# Patient Record
Sex: Male | Born: 1950 | Race: White | Hispanic: No | Marital: Married | Smoking: Former smoker
Health system: Southern US, Community
[De-identification: ages and names within clinical notes are randomized; demographics above are authoritative.]

## PROBLEM LIST (undated history)

## (undated) DIAGNOSIS — Z951 Presence of aortocoronary bypass graft: Secondary | ICD-10-CM

## (undated) DIAGNOSIS — N1831 Chronic kidney disease, stage 3a: Secondary | ICD-10-CM

## (undated) DIAGNOSIS — I2510 Atherosclerotic heart disease of native coronary artery without angina pectoris: Secondary | ICD-10-CM

## (undated) DIAGNOSIS — I5022 Chronic systolic (congestive) heart failure: Secondary | ICD-10-CM

## (undated) DIAGNOSIS — E039 Hypothyroidism, unspecified: Secondary | ICD-10-CM

---

## 1994-12-27 HISTORY — PX: CHOLECYSTECTOMY: SHX55

## 1999-12-28 HISTORY — PX: OTHER SURGICAL HISTORY: SHX169

## 2010-05-05 DIAGNOSIS — R7303 Prediabetes: Secondary | ICD-10-CM | POA: Insufficient documentation

## 2010-05-05 HISTORY — DX: Prediabetes: R73.03

## 2011-06-01 DIAGNOSIS — I25119 Atherosclerotic heart disease of native coronary artery with unspecified angina pectoris: Secondary | ICD-10-CM | POA: Insufficient documentation

## 2011-06-01 DIAGNOSIS — I2510 Atherosclerotic heart disease of native coronary artery without angina pectoris: Secondary | ICD-10-CM | POA: Insufficient documentation

## 2011-06-01 HISTORY — DX: Atherosclerotic heart disease of native coronary artery without angina pectoris: I25.10

## 2011-06-26 DIAGNOSIS — I2510 Atherosclerotic heart disease of native coronary artery without angina pectoris: Secondary | ICD-10-CM | POA: Insufficient documentation

## 2011-06-26 DIAGNOSIS — I252 Old myocardial infarction: Secondary | ICD-10-CM

## 2011-06-26 DIAGNOSIS — Z8719 Personal history of other diseases of the digestive system: Secondary | ICD-10-CM | POA: Insufficient documentation

## 2011-06-26 HISTORY — DX: Old myocardial infarction: I25.2

## 2017-10-17 HISTORY — PX: OTHER SURGICAL HISTORY: SHX169

## 2017-10-19 DIAGNOSIS — Z95810 Presence of automatic (implantable) cardiac defibrillator: Secondary | ICD-10-CM

## 2017-10-19 DIAGNOSIS — Z789 Other specified health status: Secondary | ICD-10-CM | POA: Insufficient documentation

## 2017-10-19 HISTORY — DX: Presence of automatic (implantable) cardiac defibrillator: Z95.810

## 2018-12-05 DIAGNOSIS — I77810 Thoracic aortic ectasia: Secondary | ICD-10-CM | POA: Insufficient documentation

## 2018-12-05 HISTORY — DX: Thoracic aortic ectasia: I77.810

## 2018-12-27 HISTORY — PX: ABDOMINAL HERNIA REPAIR: SHX539

## 2019-04-24 DIAGNOSIS — E039 Hypothyroidism, unspecified: Secondary | ICD-10-CM | POA: Insufficient documentation

## 2019-04-24 DIAGNOSIS — E038 Other specified hypothyroidism: Secondary | ICD-10-CM | POA: Insufficient documentation

## 2019-04-24 HISTORY — DX: Other specified hypothyroidism: E03.8

## 2019-09-14 DIAGNOSIS — I4729 Other ventricular tachycardia: Secondary | ICD-10-CM

## 2019-09-14 DIAGNOSIS — I472 Ventricular tachycardia, unspecified: Secondary | ICD-10-CM | POA: Diagnosis present

## 2019-09-14 DIAGNOSIS — I447 Left bundle-branch block, unspecified: Secondary | ICD-10-CM | POA: Insufficient documentation

## 2019-09-14 HISTORY — DX: Ventricular tachycardia: I47.2

## 2019-09-14 HISTORY — DX: Left bundle-branch block, unspecified: I44.7

## 2019-09-14 HISTORY — DX: Other ventricular tachycardia: I47.29

## 2020-08-15 DIAGNOSIS — R0602 Shortness of breath: Secondary | ICD-10-CM | POA: Diagnosis not present

## 2020-08-15 DIAGNOSIS — E785 Hyperlipidemia, unspecified: Secondary | ICD-10-CM | POA: Insufficient documentation

## 2020-08-15 DIAGNOSIS — Z950 Presence of cardiac pacemaker: Secondary | ICD-10-CM | POA: Diagnosis not present

## 2020-08-15 DIAGNOSIS — I429 Cardiomyopathy, unspecified: Secondary | ICD-10-CM | POA: Diagnosis not present

## 2020-08-15 DIAGNOSIS — I2510 Atherosclerotic heart disease of native coronary artery without angina pectoris: Secondary | ICD-10-CM | POA: Diagnosis not present

## 2020-08-15 DIAGNOSIS — R42 Dizziness and giddiness: Secondary | ICD-10-CM | POA: Diagnosis not present

## 2020-08-15 DIAGNOSIS — I472 Ventricular tachycardia: Secondary | ICD-10-CM | POA: Diagnosis not present

## 2020-08-15 HISTORY — DX: Hyperlipidemia, unspecified: E78.5

## 2020-08-27 DIAGNOSIS — M5136 Other intervertebral disc degeneration, lumbar region: Secondary | ICD-10-CM | POA: Diagnosis not present

## 2020-08-27 DIAGNOSIS — M9903 Segmental and somatic dysfunction of lumbar region: Secondary | ICD-10-CM | POA: Diagnosis not present

## 2020-08-27 DIAGNOSIS — M9904 Segmental and somatic dysfunction of sacral region: Secondary | ICD-10-CM | POA: Diagnosis not present

## 2020-08-27 DIAGNOSIS — M5441 Lumbago with sciatica, right side: Secondary | ICD-10-CM | POA: Diagnosis not present

## 2020-08-27 DIAGNOSIS — M5137 Other intervertebral disc degeneration, lumbosacral region: Secondary | ICD-10-CM | POA: Diagnosis not present

## 2020-08-29 DIAGNOSIS — M5137 Other intervertebral disc degeneration, lumbosacral region: Secondary | ICD-10-CM | POA: Diagnosis not present

## 2020-08-29 DIAGNOSIS — M5441 Lumbago with sciatica, right side: Secondary | ICD-10-CM | POA: Diagnosis not present

## 2020-08-29 DIAGNOSIS — M5136 Other intervertebral disc degeneration, lumbar region: Secondary | ICD-10-CM | POA: Diagnosis not present

## 2020-08-29 DIAGNOSIS — M9903 Segmental and somatic dysfunction of lumbar region: Secondary | ICD-10-CM | POA: Diagnosis not present

## 2020-08-29 DIAGNOSIS — M9904 Segmental and somatic dysfunction of sacral region: Secondary | ICD-10-CM | POA: Diagnosis not present

## 2020-09-05 DIAGNOSIS — M5137 Other intervertebral disc degeneration, lumbosacral region: Secondary | ICD-10-CM | POA: Diagnosis not present

## 2020-09-05 DIAGNOSIS — M5441 Lumbago with sciatica, right side: Secondary | ICD-10-CM | POA: Diagnosis not present

## 2020-09-05 DIAGNOSIS — M9904 Segmental and somatic dysfunction of sacral region: Secondary | ICD-10-CM | POA: Diagnosis not present

## 2020-09-05 DIAGNOSIS — M9903 Segmental and somatic dysfunction of lumbar region: Secondary | ICD-10-CM | POA: Diagnosis not present

## 2020-09-05 DIAGNOSIS — M5136 Other intervertebral disc degeneration, lumbar region: Secondary | ICD-10-CM | POA: Diagnosis not present

## 2020-09-10 DIAGNOSIS — I429 Cardiomyopathy, unspecified: Secondary | ICD-10-CM | POA: Diagnosis not present

## 2020-09-10 DIAGNOSIS — R0602 Shortness of breath: Secondary | ICD-10-CM | POA: Diagnosis not present

## 2020-09-10 DIAGNOSIS — I2510 Atherosclerotic heart disease of native coronary artery without angina pectoris: Secondary | ICD-10-CM | POA: Diagnosis not present

## 2020-09-10 DIAGNOSIS — I6523 Occlusion and stenosis of bilateral carotid arteries: Secondary | ICD-10-CM | POA: Diagnosis not present

## 2020-09-10 DIAGNOSIS — Z95810 Presence of automatic (implantable) cardiac defibrillator: Secondary | ICD-10-CM | POA: Diagnosis not present

## 2020-09-10 DIAGNOSIS — I255 Ischemic cardiomyopathy: Secondary | ICD-10-CM | POA: Diagnosis not present

## 2020-09-10 DIAGNOSIS — I081 Rheumatic disorders of both mitral and tricuspid valves: Secondary | ICD-10-CM | POA: Diagnosis not present

## 2020-09-10 DIAGNOSIS — I472 Ventricular tachycardia: Secondary | ICD-10-CM | POA: Diagnosis not present

## 2020-09-17 DIAGNOSIS — M9904 Segmental and somatic dysfunction of sacral region: Secondary | ICD-10-CM | POA: Diagnosis not present

## 2020-09-17 DIAGNOSIS — M9903 Segmental and somatic dysfunction of lumbar region: Secondary | ICD-10-CM | POA: Diagnosis not present

## 2020-09-17 DIAGNOSIS — M5136 Other intervertebral disc degeneration, lumbar region: Secondary | ICD-10-CM | POA: Diagnosis not present

## 2020-09-17 DIAGNOSIS — M5441 Lumbago with sciatica, right side: Secondary | ICD-10-CM | POA: Diagnosis not present

## 2020-09-17 DIAGNOSIS — M5137 Other intervertebral disc degeneration, lumbosacral region: Secondary | ICD-10-CM | POA: Diagnosis not present

## 2020-09-24 DIAGNOSIS — M5137 Other intervertebral disc degeneration, lumbosacral region: Secondary | ICD-10-CM | POA: Diagnosis not present

## 2020-09-24 DIAGNOSIS — M9903 Segmental and somatic dysfunction of lumbar region: Secondary | ICD-10-CM | POA: Diagnosis not present

## 2020-09-24 DIAGNOSIS — M5441 Lumbago with sciatica, right side: Secondary | ICD-10-CM | POA: Diagnosis not present

## 2020-09-24 DIAGNOSIS — M9904 Segmental and somatic dysfunction of sacral region: Secondary | ICD-10-CM | POA: Diagnosis not present

## 2020-09-24 DIAGNOSIS — M5136 Other intervertebral disc degeneration, lumbar region: Secondary | ICD-10-CM | POA: Diagnosis not present

## 2020-10-01 DIAGNOSIS — I2510 Atherosclerotic heart disease of native coronary artery without angina pectoris: Secondary | ICD-10-CM | POA: Diagnosis not present

## 2020-10-01 DIAGNOSIS — M5441 Lumbago with sciatica, right side: Secondary | ICD-10-CM | POA: Diagnosis not present

## 2020-10-01 DIAGNOSIS — M9903 Segmental and somatic dysfunction of lumbar region: Secondary | ICD-10-CM | POA: Diagnosis not present

## 2020-10-01 DIAGNOSIS — M5136 Other intervertebral disc degeneration, lumbar region: Secondary | ICD-10-CM | POA: Diagnosis not present

## 2020-10-01 DIAGNOSIS — E782 Mixed hyperlipidemia: Secondary | ICD-10-CM | POA: Diagnosis not present

## 2020-10-01 DIAGNOSIS — Z95810 Presence of automatic (implantable) cardiac defibrillator: Secondary | ICD-10-CM | POA: Diagnosis not present

## 2020-10-01 DIAGNOSIS — M5137 Other intervertebral disc degeneration, lumbosacral region: Secondary | ICD-10-CM | POA: Diagnosis not present

## 2020-10-01 DIAGNOSIS — I255 Ischemic cardiomyopathy: Secondary | ICD-10-CM | POA: Diagnosis not present

## 2020-10-01 DIAGNOSIS — M9904 Segmental and somatic dysfunction of sacral region: Secondary | ICD-10-CM | POA: Diagnosis not present

## 2020-10-01 DIAGNOSIS — I4891 Unspecified atrial fibrillation: Secondary | ICD-10-CM | POA: Diagnosis not present

## 2020-10-01 DIAGNOSIS — Z111 Encounter for screening for respiratory tuberculosis: Secondary | ICD-10-CM | POA: Diagnosis not present

## 2020-10-01 DIAGNOSIS — Z021 Encounter for pre-employment examination: Secondary | ICD-10-CM | POA: Diagnosis not present

## 2020-10-06 DIAGNOSIS — E039 Hypothyroidism, unspecified: Secondary | ICD-10-CM | POA: Diagnosis not present

## 2020-10-06 DIAGNOSIS — Z79899 Other long term (current) drug therapy: Secondary | ICD-10-CM | POA: Diagnosis not present

## 2020-10-06 DIAGNOSIS — I429 Cardiomyopathy, unspecified: Secondary | ICD-10-CM | POA: Diagnosis not present

## 2020-10-08 DIAGNOSIS — M5136 Other intervertebral disc degeneration, lumbar region: Secondary | ICD-10-CM | POA: Diagnosis not present

## 2020-10-08 DIAGNOSIS — M5137 Other intervertebral disc degeneration, lumbosacral region: Secondary | ICD-10-CM | POA: Diagnosis not present

## 2020-10-08 DIAGNOSIS — M5441 Lumbago with sciatica, right side: Secondary | ICD-10-CM | POA: Diagnosis not present

## 2020-10-08 DIAGNOSIS — M9904 Segmental and somatic dysfunction of sacral region: Secondary | ICD-10-CM | POA: Diagnosis not present

## 2020-10-08 DIAGNOSIS — M9903 Segmental and somatic dysfunction of lumbar region: Secondary | ICD-10-CM | POA: Diagnosis not present

## 2020-10-15 DIAGNOSIS — M5137 Other intervertebral disc degeneration, lumbosacral region: Secondary | ICD-10-CM | POA: Diagnosis not present

## 2020-10-15 DIAGNOSIS — M9903 Segmental and somatic dysfunction of lumbar region: Secondary | ICD-10-CM | POA: Diagnosis not present

## 2020-10-15 DIAGNOSIS — M5441 Lumbago with sciatica, right side: Secondary | ICD-10-CM | POA: Diagnosis not present

## 2020-10-15 DIAGNOSIS — M5136 Other intervertebral disc degeneration, lumbar region: Secondary | ICD-10-CM | POA: Diagnosis not present

## 2020-10-15 DIAGNOSIS — M9904 Segmental and somatic dysfunction of sacral region: Secondary | ICD-10-CM | POA: Diagnosis not present

## 2020-10-22 DIAGNOSIS — M9903 Segmental and somatic dysfunction of lumbar region: Secondary | ICD-10-CM | POA: Diagnosis not present

## 2020-10-22 DIAGNOSIS — M5136 Other intervertebral disc degeneration, lumbar region: Secondary | ICD-10-CM | POA: Diagnosis not present

## 2020-10-22 DIAGNOSIS — M5137 Other intervertebral disc degeneration, lumbosacral region: Secondary | ICD-10-CM | POA: Diagnosis not present

## 2020-10-22 DIAGNOSIS — M9904 Segmental and somatic dysfunction of sacral region: Secondary | ICD-10-CM | POA: Diagnosis not present

## 2020-10-22 DIAGNOSIS — M5441 Lumbago with sciatica, right side: Secondary | ICD-10-CM | POA: Diagnosis not present

## 2020-10-29 DIAGNOSIS — M5137 Other intervertebral disc degeneration, lumbosacral region: Secondary | ICD-10-CM | POA: Diagnosis not present

## 2020-10-29 DIAGNOSIS — M9903 Segmental and somatic dysfunction of lumbar region: Secondary | ICD-10-CM | POA: Diagnosis not present

## 2020-10-29 DIAGNOSIS — M9904 Segmental and somatic dysfunction of sacral region: Secondary | ICD-10-CM | POA: Diagnosis not present

## 2020-10-29 DIAGNOSIS — M5441 Lumbago with sciatica, right side: Secondary | ICD-10-CM | POA: Diagnosis not present

## 2020-10-29 DIAGNOSIS — M5136 Other intervertebral disc degeneration, lumbar region: Secondary | ICD-10-CM | POA: Diagnosis not present

## 2020-11-05 DIAGNOSIS — M5136 Other intervertebral disc degeneration, lumbar region: Secondary | ICD-10-CM | POA: Diagnosis not present

## 2020-11-05 DIAGNOSIS — M5441 Lumbago with sciatica, right side: Secondary | ICD-10-CM | POA: Diagnosis not present

## 2020-11-05 DIAGNOSIS — M9904 Segmental and somatic dysfunction of sacral region: Secondary | ICD-10-CM | POA: Diagnosis not present

## 2020-11-05 DIAGNOSIS — M9903 Segmental and somatic dysfunction of lumbar region: Secondary | ICD-10-CM | POA: Diagnosis not present

## 2020-11-05 DIAGNOSIS — M5137 Other intervertebral disc degeneration, lumbosacral region: Secondary | ICD-10-CM | POA: Diagnosis not present

## 2021-01-01 DIAGNOSIS — Z95810 Presence of automatic (implantable) cardiac defibrillator: Secondary | ICD-10-CM | POA: Diagnosis not present

## 2021-01-25 DIAGNOSIS — R509 Fever, unspecified: Secondary | ICD-10-CM | POA: Diagnosis not present

## 2021-01-25 DIAGNOSIS — Z20828 Contact with and (suspected) exposure to other viral communicable diseases: Secondary | ICD-10-CM | POA: Diagnosis not present

## 2021-02-20 DIAGNOSIS — I2510 Atherosclerotic heart disease of native coronary artery without angina pectoris: Secondary | ICD-10-CM | POA: Diagnosis not present

## 2021-02-20 DIAGNOSIS — I5020 Unspecified systolic (congestive) heart failure: Secondary | ICD-10-CM | POA: Insufficient documentation

## 2021-02-20 DIAGNOSIS — E039 Hypothyroidism, unspecified: Secondary | ICD-10-CM | POA: Diagnosis not present

## 2021-02-20 DIAGNOSIS — I255 Ischemic cardiomyopathy: Secondary | ICD-10-CM | POA: Insufficient documentation

## 2021-02-20 DIAGNOSIS — E785 Hyperlipidemia, unspecified: Secondary | ICD-10-CM | POA: Diagnosis not present

## 2021-02-20 DIAGNOSIS — I420 Dilated cardiomyopathy: Secondary | ICD-10-CM | POA: Diagnosis not present

## 2021-02-20 DIAGNOSIS — I5022 Chronic systolic (congestive) heart failure: Secondary | ICD-10-CM | POA: Insufficient documentation

## 2021-02-20 DIAGNOSIS — I472 Ventricular tachycardia: Secondary | ICD-10-CM | POA: Diagnosis not present

## 2021-03-17 ENCOUNTER — Ambulatory Visit: Payer: Self-pay | Admitting: Legal Medicine

## 2021-03-18 ENCOUNTER — Ambulatory Visit (INDEPENDENT_AMBULATORY_CARE_PROVIDER_SITE_OTHER): Payer: Medicare HMO | Admitting: Legal Medicine

## 2021-03-18 ENCOUNTER — Encounter: Payer: Self-pay | Admitting: Legal Medicine

## 2021-03-18 ENCOUNTER — Other Ambulatory Visit: Payer: Self-pay

## 2021-03-18 VITALS — BP 100/60 | HR 62 | Temp 97.4°F | Resp 16 | Ht 74.5 in | Wt 269.0 lb

## 2021-03-18 DIAGNOSIS — I472 Ventricular tachycardia, unspecified: Secondary | ICD-10-CM

## 2021-03-18 DIAGNOSIS — I420 Dilated cardiomyopathy: Secondary | ICD-10-CM

## 2021-03-18 DIAGNOSIS — E038 Other specified hypothyroidism: Secondary | ICD-10-CM

## 2021-03-18 DIAGNOSIS — I77810 Thoracic aortic ectasia: Secondary | ICD-10-CM

## 2021-03-18 DIAGNOSIS — R7303 Prediabetes: Secondary | ICD-10-CM | POA: Diagnosis not present

## 2021-03-18 DIAGNOSIS — I2510 Atherosclerotic heart disease of native coronary artery without angina pectoris: Secondary | ICD-10-CM

## 2021-03-18 DIAGNOSIS — E782 Mixed hyperlipidemia: Secondary | ICD-10-CM

## 2021-03-18 DIAGNOSIS — I255 Ischemic cardiomyopathy: Secondary | ICD-10-CM

## 2021-03-18 NOTE — Assessment & Plan Note (Signed)
cabg 2001

## 2021-03-18 NOTE — Progress Notes (Signed)
New Patient Office Visit  Subjective:  Patient ID: Bryan Miller, male    DOB: Mar 04, 1951  Age: 70 y.o. MRN: [id number]  CC:  Chief Complaint  Patient presents with  . New Patient (Initial Visit)  . Coronary Artery Disease    HPI Bryan Miller presents for establish. He is a part time Psychologist, forensic.  CORONARY ARTERY DISEASE  Patient presents in follow up of CAD. Patient was diagnosed in 2001. The patient has no associated CHF. The patient is currently taking a beta blocker, statin, and aspirin. CAD was diagnosed 20 years ago.  Patient is having no angina. Patient has used no NTG.  Patient is followed by cardiology.  Patient had CABG . Last angiography was 2001, last echocardiogram na  Patient has HYPOTHYROIDISM.  Diagnosed 10 years ago.  Patient has stable thyroid readings.  Patient is having no symptoms.  Last TSH was norml.  continue dosage of thyroid medicine..  Patient presents with hyperlipidemia.  Compliance with treatment has been good; patient takes medicines as directed, maintains low cholesterol diet, follows up as directed, and maintains exercise regimen.  Patient is using atorvastatin without problems.  History of V-tachycardia on amiodarone and defibrillator. Past Medical History:  Diagnosis Date  . Atherosclerotic heart disease of native coronary artery without angina pectoris 06/01/2011   Formatting of this note might be different from the original. 07/18/12 CT ABD AND PELVIS:  coronary artery calcifications  . Dilated aortic root (HCC) 12/05/2018  . Hyperlipidemia 08/15/2020  . Left bundle branch block 09/14/2019   Formatting of this note might be different from the original. 2011:echo -  EF 40%, 4 chamber dilation, diffuse hypokinesis, worse inferiorly; TX carvedilol, simvastatin 40 mg and aspirin 2020: Echo - mild dilated LV, EF 35-40%, Basal-mid inferolateral, basal inferior/inferoseptal wall hypokinesis; normal RV  . Old myocardial infarction 06/26/2011    Formatting of this note might be different from the original. 2004.  Marland Kitchen Prediabetes 05/05/2010  . Presence of cardiac defibrillator 10/19/2017   Formatting of this note might be different from the original. PRE-OPERATIVE RECOMMENDATION    Pt is not dependent on device   ICD: PROCEDURES if procedure is in the neck to hip region and using cautery    -Magnet applied over an ICD will disable shocking therapies only  -If electrocautery required, place grounding pad below the umbilicus, use bipolar cautery and limit use to short irregular bursts  . Subclinical hypothyroidism 04/24/2019  . 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 ABDOMINAL HERNIA REPAIR  2020  . bypass  2001  . CHOLECYSTECTOMY  1996  . 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    Family History  Problem Relation Age of Onset  . Dementia Mother   . Heart Problems Mother   . Heart Problems Father     Social History   Socioeconomic History  . Marital status: Married    Spouse name: Not on file  . Number of children: 3  . Years of education: Not on file  . Highest education level: Not on file  Occupational History  . Not on file  Tobacco Use  . Smoking status: Former Smoker    Types: Cigarettes    Quit date: 1970    Years since quitting: 52.2  . Smokeless tobacco: Never Used  Substance and Sexual Activity  . Alcohol use: Not Currently  . Drug use: Never  . Sexual activity: Yes  Partners: Female  Other Topics Concern  . Not on file  Social History Narrative  . Not on file   Social Determinants of Health   Financial Resource Strain: Not on file  Food Insecurity: Not on file  Transportation Needs: Not on file  Physical Activity: Not on file  Stress: Not on file  Social Connections: Not on file  Intimate Partner Violence: Not on file    ROS Review of Systems  Constitutional: Negative for activity change, appetite change and fatigue.   HENT: Negative for congestion and rhinorrhea.   Eyes: Negative for visual disturbance.  Respiratory: Negative for chest tightness and shortness of breath.   Cardiovascular: Negative for chest pain, palpitations and leg swelling.  Gastrointestinal: Negative for abdominal distention and abdominal pain.  Endocrine: Negative for polyuria.  Genitourinary: Negative for difficulty urinating and dysuria.  Musculoskeletal: Positive for arthralgias. Negative for back pain.  Skin: Negative.   Neurological: Negative.   Psychiatric/Behavioral: Negative.     Objective:   Today's Vitals: BP 100/60   Pulse 62   Temp (!) 97.4 F (36.3 C)   Resp 16   Ht 6' 2.5" (1.892 m)   Wt 269 lb (122 kg)   SpO2 94%   BMI 34.08 kg/m   Physical Exam Vitals reviewed.  Constitutional:      Appearance: Normal appearance.  HENT:     Head: Normocephalic.     Right Ear: Tympanic membrane normal.     Left Ear: Tympanic membrane normal.     Nose: Nose normal.     Mouth/Throat:     Mouth: Mucous membranes are moist.     Pharynx: Oropharynx is clear.  Eyes:     Extraocular Movements: Extraocular movements intact.     Conjunctiva/sclera: Conjunctivae normal.     Pupils: Pupils are equal, round, and reactive to light.  Cardiovascular:     Rate and Rhythm: Normal rate.     Heart sounds: No murmur heard. No gallop.   Pulmonary:     Effort: No respiratory distress.     Breath sounds: No rales.  Abdominal:     General: Abdomen is flat. Bowel sounds are normal. There is no distension.     Palpations: Abdomen is soft.     Tenderness: There is no abdominal tenderness.  Musculoskeletal:        General: Tenderness present.     Cervical back: Normal range of motion and neck supple.  Skin:    General: Skin is warm and dry.     Capillary Refill: Capillary refill takes less than 2 seconds.  Neurological:     General: No focal deficit present.     Mental Status: He is alert and oriented to person, place, and  time. Mental status is at baseline.     Assessment & Plan:   Problem List Items Addressed This Visit      Cardiovascular and Mediastinum   Ischemic dilated cardiomyopathy (HCC)    EF 30 to 40% Patient has dilated cardiomyopathy seeing cardiogy      Relevant Medications   amiodarone (PACERONE) 200 MG tablet   aspirin 81 MG EC tablet   atorvastatin (LIPITOR) 40 MG tablet   furosemide (LASIX) 20 MG tablet   lisinopril (ZESTRIL) 2.5 MG tablet   metoprolol tartrate (LOPRESSOR) 25 MG tablet   nitroGLYCERIN (NITROSTAT) 0.4 MG SL tablet   spironolactone (ALDACTONE) 25 MG tablet    Diagnoses and all orders for this visit: Subclinical hypothyroidism Patient is known to have  hyothyroid and is n treatment with levothyroxine .  Patient was diagnosed this year ago.  Other treatment includes non.  Patient is compliant with medicines and last TSH 2 months ago.  Last TSH was 9 . Ischemic dilated cardiomyopathy [facility]) Patient has chronic ischemic dilated cardiomyopathy with ejection fraction of 35 to 40% chronically he has had no recent ischemia.  Ventricular tachycardia [facility]) Patient has had a history of ventricular tachycardia and was revived by his defibrillator.  Mixed hyperlipidemia AN INDIVIDUAL CARE PLAN for hyperlipidemia/ cholesterol was established and reinforced today.  The patient's status was assessed using clinical findings on exam, lab and other diagnostic tests. The patient's disease status was assessed based on evidence-based guidelines and found to be fair controlled. MEDICATIONS were reviewed. SELF MANAGEMENT GOALS have been discussed and patient's success at attaining the goal of low cholesterol was assessed. RECOMMENDATION given include regular exercise 3 days a week and low cholesterol/low fat diet. CLINICAL SUMMARY including written plan to identify barriers unique to the patient due to social or economic  reasons was discussed.  Atherosclerosis of native coronary  artery of native heart without angina pectoris Patient has coronary artery disease and has had a CABG in 2001 he has had a subsequent clot of his posterior diagonal artery but no symptoms of angina.  Dilated aortic root (HCC) Dilated aortic root was found with catheterization but has not given him any trouble and is not being followed up for an aneurysm           Outpatient Encounter Medications as of 03/18/2021  Medication Sig  . acetaminophen (ACETAMINOPHEN 8 HOUR) 650 MG CR tablet Take 650 mg by mouth every 8 (eight) hours as needed for pain.  Marland Kitchen amiodarone (PACERONE) 200 MG tablet Take 1 tablet by mouth daily.  Marland Kitchen aspirin 81 MG EC tablet Take 1 tablet by mouth daily.  Marland Kitchen atorvastatin (LIPITOR) 40 MG tablet Take 40 mg by mouth daily.  . clopidogrel (PLAVIX) 75 MG tablet Take 75 mg by mouth once.  . furosemide (LASIX) 20 MG tablet Take 1 tablet by mouth daily.  Marland Kitchen levothyroxine (SYNTHROID) 25 MCG tablet Take 25 mcg by mouth daily.  Marland Kitchen lisinopril (ZESTRIL) 2.5 MG tablet Take 2.5 mg by mouth daily.  Marland Kitchen loratadine (CLARITIN) 10 MG tablet Take 10 mg by mouth daily.  . metoprolol tartrate (LOPRESSOR) 25 MG tablet Take 25 mg by mouth in the morning and at bedtime.  . nitroGLYCERIN (NITROSTAT) 0.4 MG SL tablet Place 1 tablet under the tongue as needed.  Marland Kitchen spironolactone (ALDACTONE) 25 MG tablet Take 0.5 tablets by mouth daily.   No facility-administered encounter medications on file as of 03/18/2021.    Follow-up: Return in about 3 months (around 06/18/2021) for fasting.   Brent Bulla, MD

## 2021-03-18 NOTE — Assessment & Plan Note (Signed)
EF 30 to 40%

## 2021-03-18 NOTE — Patient Instructions (Signed)
  Mr. Fickle , Thank you for taking time to come for your Medicare Wellness Visit. I appreciate your ongoing commitment to your health goals. Please review the following plan we discussed and let me know if I can assist you in the future.   These are the goals we discussed: Goals    . Increase physical activity       This is a list of the screening recommended for you and due dates:  Health Maintenance  Topic Date Due  .  Hepatitis C: One time screening is recommended by Center for Disease Control  (CDC) for  adults born from [birth year] through 1966.   Never done  . Colon Cancer Screening  Never done  . COVID-19 Vaccine (1) 04/03/2021*  . Flu Shot  05/04/2021*  . Tetanus Vaccine  10/04/2023  . Pneumonia vaccines  Completed  . HPV Vaccine  Aged Out  *Topic was postponed. The date shown is not the original due date.

## 2021-03-19 ENCOUNTER — Other Ambulatory Visit: Payer: Medicare HMO

## 2021-03-19 DIAGNOSIS — I2510 Atherosclerotic heart disease of native coronary artery without angina pectoris: Secondary | ICD-10-CM | POA: Diagnosis not present

## 2021-03-19 DIAGNOSIS — E038 Other specified hypothyroidism: Secondary | ICD-10-CM | POA: Diagnosis not present

## 2021-03-19 DIAGNOSIS — R7303 Prediabetes: Secondary | ICD-10-CM | POA: Diagnosis not present

## 2021-03-19 DIAGNOSIS — E782 Mixed hyperlipidemia: Secondary | ICD-10-CM | POA: Diagnosis not present

## 2021-03-20 ENCOUNTER — Other Ambulatory Visit: Payer: Self-pay | Admitting: Legal Medicine

## 2021-03-20 DIAGNOSIS — E038 Other specified hypothyroidism: Secondary | ICD-10-CM

## 2021-03-20 LAB — COMPREHENSIVE METABOLIC PANEL
ALT: 17 IU/L (ref 0–44)
AST: 20 IU/L (ref 0–40)
Albumin/Globulin Ratio: 1.5 (ref 1.2–2.2)
Albumin: 4.1 g/dL (ref 3.8–4.8)
Alkaline Phosphatase: 113 IU/L (ref 44–121)
BUN/Creatinine Ratio: 9 — ABNORMAL LOW (ref 10–24)
BUN: 10 mg/dL (ref 8–27)
Bilirubin Total: 0.8 mg/dL (ref 0.0–1.2)
CO2: 25 mmol/L (ref 20–29)
Calcium: 9.2 mg/dL (ref 8.6–10.2)
Chloride: 101 mmol/L (ref 96–106)
Creatinine, Ser: 1.06 mg/dL (ref 0.76–1.27)
Globulin, Total: 2.8 g/dL (ref 1.5–4.5)
Glucose: 82 mg/dL (ref 65–99)
Potassium: 4.6 mmol/L (ref 3.5–5.2)
Sodium: 140 mmol/L (ref 134–144)
Total Protein: 6.9 g/dL (ref 6.0–8.5)
eGFR: 75 mL/min/{1.73_m2} (ref >59)

## 2021-03-20 LAB — CBC WITH DIFFERENTIAL/PLATELET
Basophils Absolute: 0.1 10*3/uL (ref 0.0–0.2)
Basos: 1 %
EOS (ABSOLUTE): 1.3 10*3/uL — ABNORMAL HIGH (ref 0.0–0.4)
Eos: 13 %
Hematocrit: 46.2 % (ref 37.5–51.0)
Hemoglobin: 15.5 g/dL (ref 13.0–17.7)
Immature Grans (Abs): 0 10*3/uL (ref 0.0–0.1)
Immature Granulocytes: 0 %
Lymphocytes Absolute: 3.2 10*3/uL — ABNORMAL HIGH (ref 0.7–3.1)
Lymphs: 31 %
MCH: 31.8 pg (ref 26.6–33.0)
MCHC: 33.5 g/dL (ref 31.5–35.7)
MCV: 95 fL (ref 79–97)
Monocytes Absolute: 0.8 10*3/uL (ref 0.1–0.9)
Monocytes: 8 %
Neutrophils Absolute: 4.9 10*3/uL (ref 1.4–7.0)
Neutrophils: 47 %
Platelets: 220 10*3/uL (ref 150–450)
RBC: 4.88 x10E6/uL (ref 4.14–5.80)
RDW: 13 % (ref 11.6–15.4)
WBC: 10.3 10*3/uL (ref 3.4–10.8)

## 2021-03-20 LAB — CARDIOVASCULAR RISK ASSESSMENT

## 2021-03-20 LAB — TSH: TSH: 9.18 u[IU]/mL — ABNORMAL HIGH (ref 0.450–4.500)

## 2021-03-20 LAB — LIPID PANEL
Chol/HDL Ratio: 3.5 ratio (ref 0.0–5.0)
Cholesterol, Total: 149 mg/dL (ref 100–199)
HDL: 42 mg/dL (ref >39)
LDL Chol Calc (NIH): 79 mg/dL (ref 0–99)
Triglycerides: 160 mg/dL — ABNORMAL HIGH (ref 0–149)
VLDL Cholesterol Cal: 28 mg/dL (ref 5–40)

## 2021-03-20 LAB — HEMOGLOBIN A1C
Est. average glucose Bld gHb Est-mCnc: 117 mg/dL
Hgb A1c MFr Bld: 5.7 % — ABNORMAL HIGH (ref 4.8–5.6)

## 2021-03-20 MED ORDER — LEVOTHYROXINE SODIUM 50 MCG PO TABS: 50.0000 ug | ORAL_TABLET | Freq: Every day | ORAL | 3 refills | Status: DC

## 2021-03-20 NOTE — Progress Notes (Signed)
Cbc normal, kidney tests normal, liver tests normal, A1c 5.7, Triglycerides high- watch diet, TSH 9.18 high- need to increase thyroid medicine to 50 mcg- sent in,  lp

## 2021-03-31 DIAGNOSIS — I429 Cardiomyopathy, unspecified: Secondary | ICD-10-CM | POA: Diagnosis not present

## 2021-03-31 DIAGNOSIS — Z79899 Other long term (current) drug therapy: Secondary | ICD-10-CM | POA: Diagnosis not present

## 2021-03-31 DIAGNOSIS — E039 Hypothyroidism, unspecified: Secondary | ICD-10-CM | POA: Diagnosis not present

## 2021-03-31 DIAGNOSIS — R635 Abnormal weight gain: Secondary | ICD-10-CM | POA: Diagnosis not present

## 2021-04-02 DIAGNOSIS — Z95810 Presence of automatic (implantable) cardiac defibrillator: Secondary | ICD-10-CM | POA: Diagnosis not present

## 2021-05-29 ENCOUNTER — Ambulatory Visit (INDEPENDENT_AMBULATORY_CARE_PROVIDER_SITE_OTHER): Payer: Medicare HMO | Admitting: Legal Medicine

## 2021-05-29 ENCOUNTER — Encounter: Payer: Self-pay | Admitting: Legal Medicine

## 2021-05-29 VITALS — BP 124/68 | HR 68 | Temp 97.2°F | Ht 74.0 in | Wt 272.0 lb

## 2021-05-29 DIAGNOSIS — J019 Acute sinusitis, unspecified: Secondary | ICD-10-CM | POA: Insufficient documentation

## 2021-05-29 DIAGNOSIS — R059 Cough, unspecified: Secondary | ICD-10-CM

## 2021-05-29 DIAGNOSIS — J018 Other acute sinusitis: Secondary | ICD-10-CM | POA: Diagnosis not present

## 2021-05-29 DIAGNOSIS — J329 Chronic sinusitis, unspecified: Secondary | ICD-10-CM | POA: Insufficient documentation

## 2021-05-29 LAB — POC COVID19 BINAXNOW: SARS Coronavirus 2 Ag: NEGATIVE

## 2021-05-29 MED ORDER — AMOXICILLIN 875 MG PO TABS: 875.0000 mg | ORAL_TABLET | Freq: Two times a day (BID) | ORAL | 0 refills | Status: AC

## 2021-05-29 NOTE — Progress Notes (Signed)
Acute Office Visit  Subjective:    Patient ID: Bryan Miller, male    DOB: [birth year], 70 y.o.   MRN: [id number]  Chief Complaint  Patient presents with  . URI    HPI Patient is in today for URI x 6 days. C/o dry cough sometimes productive, sore throat but gargled with salt water which then resolved issue. Has laryngitis.  No fever or chills.  Past Medical History:  Diagnosis Date  . Atherosclerotic heart disease of native coronary artery without angina pectoris 06/01/2011   Formatting of this note might be different from the original. 07/18/12 CT ABD AND PELVIS:  coronary artery calcifications  . Dilated aortic root (HCC) 12/05/2018  . Hyperlipidemia 08/15/2020  . Left bundle branch block 09/14/2019   Formatting of this note might be different from the original. 2011:echo -  EF 40%, 4 chamber dilation, diffuse hypokinesis, worse inferiorly; TX carvedilol, simvastatin 40 mg and aspirin 2020: Echo - mild dilated LV, EF 35-40%, Basal-mid inferolateral, basal inferior/inferoseptal wall hypokinesis; normal RV  . Old myocardial infarction 06/26/2011   Formatting of this note might be different from the original. 2004.  Marland Kitchen Prediabetes 05/05/2010  . Presence of cardiac defibrillator 10/19/2017   Formatting of this note might be different from the original. PRE-OPERATIVE RECOMMENDATION    Pt is not dependent on device   ICD: PROCEDURES if procedure is in the neck to hip region and using cautery    -Magnet applied over an ICD will disable shocking therapies only  -If electrocautery required, place grounding pad below the umbilicus, use bipolar cautery and limit use to short irregular bursts  . Subclinical hypothyroidism 04/24/2019  . 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 ABDOMINAL HERNIA REPAIR  2020  . bypass  2001  . CHOLECYSTECTOMY  1996  . 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    Family History  Problem  Relation Age of Onset  . Dementia Mother   . Heart Problems Mother   . Heart Problems Father     Social History   Socioeconomic History  . Marital status: Married    Spouse name: Not on file  . Number of children: 3  . Years of education: Not on file  . Highest education level: Not on file  Occupational History  . Not on file  Tobacco Use  . Smoking status: Former Smoker    Types: Cigarettes    Quit date: 1970    Years since quitting: 52.4  . Smokeless tobacco: Never Used  Substance and Sexual Activity  . Alcohol use: Not Currently  . Drug use: Never  . Sexual activity: Yes    Partners: Female  Other Topics Concern  . Not on file  Social History Narrative  . Not on file   Social Determinants of Health   Financial Resource Strain: Not on file  Food Insecurity: Not on file  Transportation Needs: Not on file  Physical Activity: Not on file  Stress: Not on file  Social Connections: Not on file  Intimate Partner Violence: Not on file    Outpatient Medications Prior to Visit  Medication Sig Dispense Refill  . acetaminophen (ACETAMINOPHEN 8 HOUR) 650 MG CR tablet Take 650 mg by mouth every 8 (eight) hours as needed for pain.    Marland Kitchen amiodarone (PACERONE) 200 MG tablet Take 1 tablet by mouth daily.    Marland Kitchen aspirin 81 MG EC tablet Take 1 tablet by mouth  daily.    . atorvastatin (LIPITOR) 40 MG tablet Take 40 mg by mouth daily.    . clopidogrel (PLAVIX) 75 MG tablet Take 75 mg by mouth once.    . furosemide (LASIX) 20 MG tablet Take 1 tablet by mouth daily.    Marland Kitchen levothyroxine (SYNTHROID) 50 MCG tablet Take 1 tablet (50 mcg total) by mouth daily. 90 tablet 3  . lisinopril (ZESTRIL) 2.5 MG tablet Take 2.5 mg by mouth daily.    Marland Kitchen loratadine (CLARITIN) 10 MG tablet Take 10 mg by mouth daily.    . metoprolol tartrate (LOPRESSOR) 25 MG tablet Take 25 mg by mouth in the morning and at bedtime.    . nitroGLYCERIN (NITROSTAT) 0.4 MG SL tablet Place 1 tablet under the tongue as needed.     Marland Kitchen spironolactone (ALDACTONE) 25 MG tablet Take 0.5 tablets by mouth daily.     No facility-administered medications prior to visit.    No Known Allergies  Review of Systems  Constitutional: Negative for chills, diaphoresis, fatigue and fever.  HENT: Positive for postnasal drip (Occ.) and sneezing. Negative for congestion, ear pain, rhinorrhea and sore throat.   Respiratory: Positive for cough. Negative for shortness of breath.   Cardiovascular: Negative for chest pain and leg swelling.  Gastrointestinal: Negative for abdominal distention and abdominal pain.  Musculoskeletal: Negative for arthralgias and back pain.  Neurological: Negative for dizziness and headaches.  Psychiatric/Behavioral: Negative.        Objective:    Physical Exam Vitals reviewed.  Constitutional:      General: He is not in acute distress.    Appearance: Normal appearance. He is not ill-appearing.  HENT:     Head: Normocephalic.     Right Ear: Tympanic membrane, ear canal and external ear normal.     Left Ear: Tympanic membrane, ear canal and external ear normal.     Nose: Congestion and rhinorrhea present.     Mouth/Throat:     Mouth: Mucous membranes are moist.     Pharynx: Oropharynx is clear.  Eyes:     Extraocular Movements: Extraocular movements intact.     Conjunctiva/sclera: Conjunctivae normal.     Pupils: Pupils are equal, round, and reactive to light.  Cardiovascular:     Rate and Rhythm: Normal rate and regular rhythm.     Pulses: Normal pulses.     Heart sounds: Normal heart sounds.  Pulmonary:     Effort: Pulmonary effort is normal. No respiratory distress.     Breath sounds: No rales.  Abdominal:     General: Abdomen is flat. Bowel sounds are normal. There is no distension.     Palpations: Abdomen is soft.     Tenderness: There is no abdominal tenderness.  Musculoskeletal:        General: Normal range of motion.     Cervical back: Normal range of motion and neck supple.   Skin:    General: Skin is warm.     Capillary Refill: Capillary refill takes less than 2 seconds.  Neurological:     Mental Status: He is alert.     BP 124/68   Pulse 68   Temp (!) 97.2 F (36.2 C)   Ht 6\' 2"  (1.88 m)   Wt 272 lb (123.4 kg)   SpO2 94%   BMI 34.92 kg/m  Wt Readings from Last 3 Encounters:  05/29/21 272 lb (123.4 kg)  03/18/21 269 lb (122 kg)    Health Maintenance Due  Topic Date  Due  . Pneumococcal Vaccine 7-52 Years old (1 of 4 - PCV13) Never done  . Hepatitis C Screening  Never done  . COLONOSCOPY (Pts 45-110yrs Insurance coverage will need to be confirmed)  Never done  . Zoster Vaccines- Shingrix (1 of 2) Never done    There are no preventive care reminders to display for this patient.   Lab Results  Component Value Date   TSH 9.180 (H) 03/19/2021   Lab Results  Component Value Date   WBC 10.3 03/19/2021   HGB 15.5 03/19/2021   HCT 46.2 03/19/2021   MCV 95 03/19/2021   PLT 220 03/19/2021   Lab Results  Component Value Date   NA 140 03/19/2021   K 4.6 03/19/2021   CO2 25 03/19/2021   GLUCOSE 82 03/19/2021   BUN 10 03/19/2021   CREATININE 1.06 03/19/2021   BILITOT 0.8 03/19/2021   ALKPHOS 113 03/19/2021   AST 20 03/19/2021   ALT 17 03/19/2021   PROT 6.9 03/19/2021   ALBUMIN 4.1 03/19/2021   CALCIUM 9.2 03/19/2021   EGFR 75 03/19/2021   Lab Results  Component Value Date   CHOL 149 03/19/2021   Lab Results  Component Value Date   HDL 42 03/19/2021   Lab Results  Component Value Date   LDLCALC 79 03/19/2021   Lab Results  Component Value Date   TRIG 160 (H) 03/19/2021   Lab Results  Component Value Date   CHOLHDL 3.5 03/19/2021   Lab Results  Component Value Date   HGBA1C 5.7 (H) 03/19/2021       Assessment & Plan:  Diagnoses and all orders for this visit: Cough -     POC COVID-19 BinaxNow Acute non-recurrent sinusitis of other sinus -     amoxicillin (AMOXIL) 875 MG tablet; Take 1 tablet (875 mg total) by  mouth 2 (two) times daily for 10 days. Patient has sinusitis with sinus drainage       I spent 15 minutes dedicated to the care of this patient on the date of this encounter to include face-to-face time with the patient, as well as:   Follow-up: Return if symptoms worsen or fail to improve.  An After Visit Summary was printed and given to the patient.  Brent Bulla, MD [facility] [phone]

## 2021-06-22 ENCOUNTER — Ambulatory Visit: Payer: Medicare HMO | Admitting: Legal Medicine

## 2021-06-26 ENCOUNTER — Ambulatory Visit (INDEPENDENT_AMBULATORY_CARE_PROVIDER_SITE_OTHER): Payer: Medicare HMO | Admitting: Legal Medicine

## 2021-06-26 ENCOUNTER — Other Ambulatory Visit: Payer: Self-pay

## 2021-06-26 ENCOUNTER — Encounter: Payer: Self-pay | Admitting: Legal Medicine

## 2021-06-26 VITALS — BP 80/60 | HR 60 | Temp 97.5°F | Resp 16 | Ht 74.0 in | Wt 271.0 lb

## 2021-06-26 DIAGNOSIS — E038 Other specified hypothyroidism: Secondary | ICD-10-CM | POA: Diagnosis not present

## 2021-06-26 DIAGNOSIS — I472 Ventricular tachycardia, unspecified: Secondary | ICD-10-CM

## 2021-06-26 DIAGNOSIS — Z6834 Body mass index (BMI) 34.0-34.9, adult: Secondary | ICD-10-CM | POA: Diagnosis not present

## 2021-06-26 DIAGNOSIS — I255 Ischemic cardiomyopathy: Secondary | ICD-10-CM

## 2021-06-26 DIAGNOSIS — I2510 Atherosclerotic heart disease of native coronary artery without angina pectoris: Secondary | ICD-10-CM | POA: Diagnosis not present

## 2021-06-26 DIAGNOSIS — I420 Dilated cardiomyopathy: Secondary | ICD-10-CM | POA: Diagnosis not present

## 2021-06-26 DIAGNOSIS — E782 Mixed hyperlipidemia: Secondary | ICD-10-CM

## 2021-06-26 DIAGNOSIS — Z6835 Body mass index (BMI) 35.0-35.9, adult: Secondary | ICD-10-CM | POA: Insufficient documentation

## 2021-06-26 DIAGNOSIS — R7303 Prediabetes: Secondary | ICD-10-CM | POA: Diagnosis not present

## 2021-06-26 NOTE — Progress Notes (Signed)
Established Patient Office Visit  Subjective:  Patient ID: Bryan Miller, male    DOB: March 29, 1951  Age: 70 y.o. MRN: [id number]  CC:  Chief Complaint  Patient presents with   Hypothyroidism   Hyperlipidemia   Prediabetes    HPI ED ANNESS presents for chronic visit  Prediabetes stable on diet  Patient presents with hyperlipidemia.  Compliance with treatment has been good; patient takes medicines as directed, maintains low cholesterol diet, follows up as directed, and maintains exercise regimen.  Patient is using atorvastatin without problems.   Patient has HYPOTHYROIDISM.  Diagnosed 10 years ago.  Patient has stable thyroid readings.  Patient is having no symptoms.  Last TSH was normal.  continue dosage of thyroid medicine.  Past Medical History:  Diagnosis Date   Atherosclerotic heart disease of native coronary artery without angina pectoris 06/01/2011   Formatting of this note might be different from the original. 07/18/12 CT ABD AND PELVIS:  coronary artery calcifications   Dilated aortic root (HCC) 12/05/2018   Hyperlipidemia 08/15/2020   Left bundle branch block 09/14/2019   Formatting of this note might be different from the original. 2011:echo -  EF 40%, 4 chamber dilation, diffuse hypokinesis, worse inferiorly; TX carvedilol, simvastatin 40 mg and aspirin 2020: Echo - mild dilated LV, EF 35-40%, Basal-mid inferolateral, basal inferior/inferoseptal wall hypokinesis; normal RV   Old myocardial infarction 06/26/2011   Formatting of this note might be different from the original. 2004.   Prediabetes 05/05/2010   Presence of cardiac defibrillator 10/19/2017   Formatting of this note might be different from the original. 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    Family History  Problem Relation Age of Onset   Dementia Mother    Heart Problems Mother    Heart Problems Father     Social History   Socioeconomic History   Marital status: Married    Spouse name: Not on file   Number of children: 3   Years of education: Not on file   Highest education level: Not on file  Occupational History   Not on file  Tobacco Use   Smoking status: Former    Pack years: 0.00    Types: Cigarettes    Quit date: 1970    Years since quitting: 52.5   Smokeless tobacco: Never  Substance and Sexual Activity   Alcohol use: Not Currently   Drug use: Never   Sexual activity: Yes    Partners: Female  Other Topics Concern   Not on file  Social History Narrative   Not on file   Social Determinants of Health   Financial Resource Strain: Not on file  Food Insecurity: Not on file  Transportation Needs: Not on file  Physical Activity: Not on file  Stress: Not on file  Social Connections: Not on file  Intimate Partner Violence: Not on file    Outpatient Medications  Prior to Visit  Medication Sig Dispense Refill   acetaminophen (TYLENOL) 650 MG CR tablet Take 650 mg by mouth every 8 (eight) hours as needed for pain.     amiodarone (PACERONE) 200 MG tablet Take 1 tablet by mouth daily.     aspirin 81 MG EC tablet Take 1 tablet by mouth daily.     atorvastatin (LIPITOR) 40 MG tablet Take 40 mg by mouth daily.     clopidogrel (PLAVIX) 75 MG tablet Take 75 mg by mouth once.     furosemide (LASIX) 20 MG tablet Take 1 tablet by mouth daily.     lisinopril (ZESTRIL) 2.5 MG tablet Take 2.5 mg by mouth daily.      loratadine (CLARITIN) 10 MG tablet Take 10 mg by mouth daily.     nitroGLYCERIN (NITROSTAT) 0.4 MG SL tablet Place 1 tablet under the tongue as needed.     spironolactone (ALDACTONE) 25 MG tablet Take 0.5 tablets by mouth daily.     levothyroxine (SYNTHROID) 50 MCG tablet Take 1 tablet (50 mcg total) by mouth daily. 90 tablet 3   metoprolol tartrate (LOPRESSOR) 25 MG tablet Take 25 mg by mouth in the morning and at bedtime.     No facility-administered medications prior to visit.    No Known Allergies  ROS Review of Systems  Constitutional:  Negative for chills, fatigue and fever.  HENT:  Negative for congestion, ear pain and sore throat.   Respiratory:  Negative for cough and shortness of breath.   Cardiovascular:  Negative for chest pain.  Gastrointestinal:  Negative for abdominal pain, constipation, diarrhea, nausea and vomiting.  Endocrine: Negative for polydipsia, polyphagia and polyuria.  Genitourinary:  Negative for dysuria and frequency.  Musculoskeletal:  Negative for arthralgias and myalgias.  Skin: Negative.   Neurological:  Negative for dizziness and headaches.  Psychiatric/Behavioral:  Negative for dysphoric mood.        No dysphoria     Objective:    Physical Exam Vitals reviewed.  Constitutional:      General: He is not in acute distress.    Appearance: Normal appearance. He is obese.  HENT:     Head: Normocephalic and atraumatic.     Right Ear: Tympanic membrane, ear canal and external ear normal.     Left Ear: Tympanic membrane, ear canal and external ear normal.     Mouth/Throat:     Mouth: Mucous membranes are moist.     Pharynx: Oropharynx is clear.  Eyes:     Extraocular Movements: Extraocular movements intact.     Conjunctiva/sclera: Conjunctivae normal.     Pupils: Pupils are equal, round, and reactive to light.  Cardiovascular:     Rate and Rhythm: Normal rate and regular rhythm.     Pulses: Normal pulses.     Heart sounds: Normal heart sounds.  No murmur heard.   No gallop.  Pulmonary:     Effort: Pulmonary effort is normal. No respiratory distress.     Breath sounds: Normal breath sounds. No wheezing.  Abdominal:     General: Abdomen is flat. Bowel sounds are normal. There is no distension.     Palpations: Abdomen is soft.     Tenderness: There is no abdominal tenderness.  Musculoskeletal:        General: No swelling or tenderness. Normal range of motion.     Cervical back: Normal range of motion and neck supple.     Right lower leg: No edema.  Left lower leg: No edema.  Skin:    General: Skin is warm.     Capillary Refill: Capillary refill takes less than 2 seconds.  Neurological:     General: No focal deficit present.     Mental Status: He is alert and oriented to person, place, and time. Mental status is at baseline.  Psychiatric:        Mood and Affect: Mood normal.        Behavior: Behavior normal.        Thought Content: Thought content normal.        Judgment: Judgment normal.    BP (!) 80/60   Pulse 60   Temp (!) 97.5 F (36.4 C)   Resp 16   Ht 6\' 2"  (1.88 m)   Wt 271 lb (122.9 kg)   SpO2 97%   BMI 34.79 kg/m  Wt Readings from Last 3 Encounters:  06/26/21 271 lb (122.9 kg)  05/29/21 272 lb (123.4 kg)  03/18/21 269 lb (122 kg)     Health Maintenance Due  Topic Date Due   Hepatitis C Screening  Never done   Zoster Vaccines- Shingrix (1 of 2) Never done   COLONOSCOPY (Pts 45-9yrs Insurance coverage will need to be confirmed)  Never done    There are no preventive care reminders to display for this patient.  Lab Results  Component Value Date   TSH 11.500 (H) 06/26/2021   Lab Results  Component Value Date   WBC 7.9 06/26/2021   HGB 14.9 06/26/2021   HCT 44.6 06/26/2021   MCV 94 06/26/2021   PLT 192 06/26/2021   Lab Results  Component Value Date   NA 144 06/26/2021   K 4.8 06/26/2021   CO2 23 06/26/2021   GLUCOSE 96 06/26/2021   BUN 17 06/26/2021   CREATININE 1.10 06/26/2021    BILITOT 0.5 06/26/2021   ALKPHOS 115 06/26/2021   AST 17 06/26/2021   ALT 12 06/26/2021   PROT 6.6 06/26/2021   ALBUMIN 3.9 06/26/2021   CALCIUM 9.3 06/26/2021   EGFR 72 06/26/2021   Lab Results  Component Value Date   CHOL 142 06/26/2021   Lab Results  Component Value Date   HDL 43 06/26/2021   Lab Results  Component Value Date   LDLCALC 75 06/26/2021   Lab Results  Component Value Date   TRIG 138 06/26/2021   Lab Results  Component Value Date   CHOLHDL 3.3 06/26/2021   Lab Results  Component Value Date   HGBA1C 5.9 (H) 06/26/2021      Assessment & Plan:   Diagnoses and all orders for this visit: Subclinical hypothyroidism -     TSH Patient is known to have hypothyroidism and is on treatment with levothyroxine changes to 75 mcg.  Patient was diagnosed 10 years ago.  Other treatment includes none.  Patient is compliant with medicines and last TSH 6 months ago.  Last TSH was high.   Ischemic dilated cardiomyopathy (HCC) AN INDIVIDUAL CARE PLAN for cardiomyopathy was established and reinforced today.  The patient's status was assessed using clinical findings on exam, labs, and other diagnostic testing. Patient's success at meeting treatment goals based on disease specific evidence-bassed guidelines and found to be in fair control. RECOMMENDATIONS include maintaining present medicines and treatment.   Mixed hyperlipidemia -     Lipid panel AN INDIVIDUAL CARE PLAN for hyperlipidemia/ cholesterol was established and reinforced today.  The patient's status was assessed using clinical findings on  exam, lab and other diagnostic tests. The patient's disease status was assessed based on evidence-based guidelines and found to be fair controlled. MEDICATIONS were reviewed. SELF MANAGEMENT GOALS have been discussed and patient's success at attaining the goal of low cholesterol was assessed. RECOMMENDATION given include regular exercise 3 days a week and low cholesterol/low  fat diet. CLINICAL SUMMARY including written plan to identify barriers unique to the patient due to social or economic  reasons was discussed.  Ventricular tachycardia (HCC)  -     Comprehensive metabolic panel -     CBC with Differential/Platelet AN INDIVIDUAL CARE PLAN for VT was established and reinforced today.  The patient's status was assessed using clinical findings on exam, labs, and other diagnostic testing. Patient's success at meeting treatment goals based on disease specific evidence-bassed guidelines and found to be in fair control. RECOMMENDATIONS include ICD placed.   Atherosclerosis of native coronary artery of native heart without angina pectoris An individual plan was formulated based on patient history and exam, labs and evidence based data. Patient has not had recent angina or nitroglycerin use. continue present treatment.  See cardiology  Prediabetes -     Hemoglobin A1c Patient has prediabetes and is on exercise and diet  BMI 34.0-34.9,adult An individualize plan was formulated for obesity using patient history and physical exam to encourage weight loss.  An evidence based program was formulated.  Patient is to cut portion and plate size with meals and to plan physical exercise 3 days a week at least 20 minutes.  Weight watchers and other programs are helpful.  Planned amount of weight loss 10 lbs.  Other orders -     Cardiovascular Risk Assessment      Follow-up: Return in about 4 months (around 10/27/2021) for fasting.    Brent Bulla, MD

## 2021-06-27 LAB — COMPREHENSIVE METABOLIC PANEL
ALT: 12 IU/L (ref 0–44)
AST: 17 IU/L (ref 0–40)
Albumin/Globulin Ratio: 1.4 (ref 1.2–2.2)
Albumin: 3.9 g/dL (ref 3.8–4.8)
Alkaline Phosphatase: 115 IU/L (ref 44–121)
BUN/Creatinine Ratio: 15 (ref 10–24)
BUN: 17 mg/dL (ref 8–27)
Bilirubin Total: 0.5 mg/dL (ref 0.0–1.2)
CO2: 23 mmol/L (ref 20–29)
Calcium: 9.3 mg/dL (ref 8.6–10.2)
Chloride: 105 mmol/L (ref 96–106)
Creatinine, Ser: 1.1 mg/dL (ref 0.76–1.27)
Globulin, Total: 2.7 g/dL (ref 1.5–4.5)
Glucose: 96 mg/dL (ref 65–99)
Potassium: 4.8 mmol/L (ref 3.5–5.2)
Sodium: 144 mmol/L (ref 134–144)
Total Protein: 6.6 g/dL (ref 6.0–8.5)
eGFR: 72 mL/min/{1.73_m2} (ref >59)

## 2021-06-27 LAB — LIPID PANEL
Chol/HDL Ratio: 3.3 ratio (ref 0.0–5.0)
Cholesterol, Total: 142 mg/dL (ref 100–199)
HDL: 43 mg/dL (ref >39)
LDL Chol Calc (NIH): 75 mg/dL (ref 0–99)
Triglycerides: 138 mg/dL (ref 0–149)
VLDL Cholesterol Cal: 24 mg/dL (ref 5–40)

## 2021-06-27 LAB — CBC WITH DIFFERENTIAL/PLATELET
Basophils Absolute: 0.1 10*3/uL (ref 0.0–0.2)
Basos: 1 %
EOS (ABSOLUTE): 1 10*3/uL — ABNORMAL HIGH (ref 0.0–0.4)
Eos: 13 %
Hematocrit: 44.6 % (ref 37.5–51.0)
Hemoglobin: 14.9 g/dL (ref 13.0–17.7)
Immature Grans (Abs): 0 10*3/uL (ref 0.0–0.1)
Immature Granulocytes: 0 %
Lymphocytes Absolute: 2.8 10*3/uL (ref 0.7–3.1)
Lymphs: 35 %
MCH: 31.3 pg (ref 26.6–33.0)
MCHC: 33.4 g/dL (ref 31.5–35.7)
MCV: 94 fL (ref 79–97)
Monocytes Absolute: 0.7 10*3/uL (ref 0.1–0.9)
Monocytes: 9 %
Neutrophils Absolute: 3.4 10*3/uL (ref 1.4–7.0)
Neutrophils: 42 %
Platelets: 192 10*3/uL (ref 150–450)
RBC: 4.76 x10E6/uL (ref 4.14–5.80)
RDW: 12.6 % (ref 11.6–15.4)
WBC: 7.9 10*3/uL (ref 3.4–10.8)

## 2021-06-27 LAB — HEMOGLOBIN A1C
Est. average glucose Bld gHb Est-mCnc: 123 mg/dL
Hgb A1c MFr Bld: 5.9 % — ABNORMAL HIGH (ref 4.8–5.6)

## 2021-06-27 LAB — TSH: TSH: 11.5 u[IU]/mL — ABNORMAL HIGH (ref 0.450–4.500)

## 2021-06-27 LAB — CARDIOVASCULAR RISK ASSESSMENT

## 2021-06-28 ENCOUNTER — Other Ambulatory Visit: Payer: Self-pay | Admitting: Legal Medicine

## 2021-06-28 DIAGNOSIS — E038 Other specified hypothyroidism: Secondary | ICD-10-CM

## 2021-06-28 MED ORDER — LEVOTHYROXINE SODIUM 75 MCG PO TABS: 75.0000 ug | ORAL_TABLET | Freq: Every day | ORAL | 3 refills | Status: DC

## 2021-06-28 NOTE — Progress Notes (Signed)
Kidney and liver tests normal, A1c 5.9 still prediabetes, Cholesterol normal, CBC normal, TSH11.5 high increase thyroid to a day- called in, recheck in 6 weeks,  lp

## 2021-06-30 ENCOUNTER — Other Ambulatory Visit: Payer: Self-pay

## 2021-06-30 DIAGNOSIS — E038 Other specified hypothyroidism: Secondary | ICD-10-CM

## 2021-07-02 DIAGNOSIS — Z95810 Presence of automatic (implantable) cardiac defibrillator: Secondary | ICD-10-CM | POA: Diagnosis not present

## 2021-07-07 DIAGNOSIS — R635 Abnormal weight gain: Secondary | ICD-10-CM | POA: Diagnosis not present

## 2021-07-07 DIAGNOSIS — E039 Hypothyroidism, unspecified: Secondary | ICD-10-CM | POA: Diagnosis not present

## 2021-07-07 DIAGNOSIS — Z79899 Other long term (current) drug therapy: Secondary | ICD-10-CM | POA: Diagnosis not present

## 2021-07-08 DIAGNOSIS — M5136 Other intervertebral disc degeneration, lumbar region: Secondary | ICD-10-CM | POA: Diagnosis not present

## 2021-07-08 DIAGNOSIS — M9903 Segmental and somatic dysfunction of lumbar region: Secondary | ICD-10-CM | POA: Diagnosis not present

## 2021-07-08 DIAGNOSIS — M5137 Other intervertebral disc degeneration, lumbosacral region: Secondary | ICD-10-CM | POA: Diagnosis not present

## 2021-07-08 DIAGNOSIS — M5441 Lumbago with sciatica, right side: Secondary | ICD-10-CM | POA: Diagnosis not present

## 2021-07-08 DIAGNOSIS — M9904 Segmental and somatic dysfunction of sacral region: Secondary | ICD-10-CM | POA: Diagnosis not present

## 2021-07-28 ENCOUNTER — Ambulatory Visit (INDEPENDENT_AMBULATORY_CARE_PROVIDER_SITE_OTHER): Payer: Medicare HMO | Admitting: Legal Medicine

## 2021-07-28 ENCOUNTER — Encounter: Payer: Self-pay | Admitting: Legal Medicine

## 2021-07-28 ENCOUNTER — Other Ambulatory Visit: Payer: Medicare HMO

## 2021-07-28 VITALS — BP 82/58 | HR 68 | Temp 97.7°F | Resp 16 | Ht 74.0 in | Wt 270.0 lb

## 2021-07-28 DIAGNOSIS — M25571 Pain in right ankle and joints of right foot: Secondary | ICD-10-CM | POA: Diagnosis not present

## 2021-07-28 DIAGNOSIS — E038 Other specified hypothyroidism: Secondary | ICD-10-CM | POA: Diagnosis not present

## 2021-07-28 DIAGNOSIS — S99911A Unspecified injury of right ankle, initial encounter: Secondary | ICD-10-CM | POA: Diagnosis not present

## 2021-07-28 DIAGNOSIS — M7989 Other specified soft tissue disorders: Secondary | ICD-10-CM | POA: Diagnosis not present

## 2021-07-28 NOTE — Progress Notes (Signed)
 Established Patient Office Visit  Subjective:  Patient ID: Bryan Miller, male    DOB: 01/27/1951  Age: 70 y.o. MRN: [id number]  CC:  Chief Complaint  Patient presents with   Leg Pain    Right lower leg pain and swelling since Friday after he fell from stairs    HPI [name] presents for right ankle sprain used ice, can bear weight. Fell 7, 29.2022.  He was lifing bucket and twisted his right ankle and hip concrete.  It is painful and swollen with ecchymosis.  Past Medical History:  Diagnosis Date   Atherosclerotic heart disease of native coronary artery without angina pectoris 06/01/2011   Formatting of this note might be different from the original. 07/18/12 CT ABD AND PELVIS:  coronary artery calcifications   Dilated aortic root (HCC) 12/05/2018   Hyperlipidemia 08/15/2020   Left bundle branch block 09/14/2019   Formatting of this note might be different from the original. 2011:echo -  EF 40%, 4 chamber dilation, diffuse hypokinesis, worse inferiorly; TX carvedilol, simvastatin 40 mg and aspirin 2020: Echo - mild dilated LV, EF 35-40%, Basal-mid inferolateral, basal inferior/inferoseptal wall hypokinesis; normal RV   Old myocardial infarction 06/26/2011   Formatting of this note might be different from the original. 2004.   Prediabetes 05/05/2010   Presence of cardiac defibrillator 10/19/2017   Formatting of this note might be different from the original. 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    Family History  Problem Relation Age of Onset   Dementia Mother    Heart Problems Mother    Heart Problems Father     Social History   Socioeconomic History   Marital status: Married    Spouse name: Not on file   Number of children: 3   Years of education: Not on file   Highest education level: Not on file  Occupational History   Not on file  Tobacco Use   Smoking status: Former    Types: Cigarettes    Quit date: 1970    Years since quitting: 52.6   Smokeless tobacco: Never  Substance and Sexual Activity   Alcohol use: Not Currently   Drug use: Never   Sexual activity: Yes    Partners: Female  Other Topics Concern   Not on file  Social History Narrative   Not on file   Social Determinants of Health   Financial Resource Strain: Not on file  Food Insecurity: Not on file  Transportation Needs: Not on file  Physical Activity: Not on file  Stress: Not on file  Social Connections: Not on file  Intimate Partner Violence: Not on file    Outpatient Medications Prior to Visit  Medication Sig Dispense Refill   acetaminophen (TYLENOL) 650 MG CR tablet Take 650 mg by mouth every 8 (eight) hours as needed for pain.     amiodarone (PACERONE) 200 MG tablet Take    Established Patient Office Visit  Subjective:  Patient ID: Bryan Miller, male    DOB: 01/27/1951  Age: 70 y.o. MRN: [id number]  CC:  Chief Complaint  Patient presents with   Leg Pain    Right lower leg pain and swelling since Friday after he fell from stairs    HPI [name] presents for right ankle sprain used ice, can bear weight. Fell 7, 29.2022.  He was lifing bucket and twisted his right ankle and hip concrete.  It is painful and swollen with ecchymosis.  Past Medical History:  Diagnosis Date   Atherosclerotic heart disease of native coronary artery without angina pectoris 06/01/2011   Formatting of this note might be different from the original. 07/18/12 CT ABD AND PELVIS:  coronary artery calcifications   Dilated aortic root (HCC) 12/05/2018   Hyperlipidemia 08/15/2020   Left bundle branch block 09/14/2019   Formatting of this note might be different from the original. 2011:echo -  EF 40%, 4 chamber dilation, diffuse hypokinesis, worse inferiorly; TX carvedilol, simvastatin 40 mg and aspirin 2020: Echo - mild dilated LV, EF 35-40%, Basal-mid inferolateral, basal inferior/inferoseptal wall hypokinesis; normal RV   Old myocardial infarction 06/26/2011   Formatting of this note might be different from the original. 2004.   Prediabetes 05/05/2010   Presence of cardiac defibrillator 10/19/2017   Formatting of this note might be different from the original. 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    Family History  Problem Relation Age of Onset   Dementia Mother    Heart Problems Mother    Heart Problems Father     Social History   Socioeconomic History   Marital status: Married    Spouse name: Not on file   Number of children: 3   Years of education: Not on file   Highest education level: Not on file  Occupational History   Not on file  Tobacco Use   Smoking status: Former    Types: Cigarettes    Quit date: 1970    Years since quitting: 52.6   Smokeless tobacco: Never  Substance and Sexual Activity   Alcohol use: Not Currently   Drug use: Never   Sexual activity: Yes    Partners: Female  Other Topics Concern   Not on file  Social History Narrative   Not on file   Social Determinants of Health   Financial Resource Strain: Not on file  Food Insecurity: Not on file  Transportation Needs: Not on file  Physical Activity: Not on file  Stress: Not on file  Social Connections: Not on file  Intimate Partner Violence: Not on file    Outpatient Medications Prior to Visit  Medication Sig Dispense Refill   acetaminophen (TYLENOL) 650 MG CR tablet Take 650 mg by mouth every 8 (eight) hours as needed for pain.     amiodarone (PACERONE) 200 MG tablet Take    Established Patient Office Visit  Subjective:  Patient ID: Bryan Miller, male    DOB: 01/27/1951  Age: 70 y.o. MRN: [id number]  CC:  Chief Complaint  Patient presents with   Leg Pain    Right lower leg pain and swelling since Friday after he fell from stairs    HPI [name] presents for right ankle sprain used ice, can bear weight. Fell 7, 29.2022.  He was lifing bucket and twisted his right ankle and hip concrete.  It is painful and swollen with ecchymosis.  Past Medical History:  Diagnosis Date   Atherosclerotic heart disease of native coronary artery without angina pectoris 06/01/2011   Formatting of this note might be different from the original. 07/18/12 CT ABD AND PELVIS:  coronary artery calcifications   Dilated aortic root (HCC) 12/05/2018   Hyperlipidemia 08/15/2020   Left bundle branch block 09/14/2019   Formatting of this note might be different from the original. 2011:echo -  EF 40%, 4 chamber dilation, diffuse hypokinesis, worse inferiorly; TX carvedilol, simvastatin 40 mg and aspirin 2020: Echo - mild dilated LV, EF 35-40%, Basal-mid inferolateral, basal inferior/inferoseptal wall hypokinesis; normal RV   Old myocardial infarction 06/26/2011   Formatting of this note might be different from the original. 2004.   Prediabetes 05/05/2010   Presence of cardiac defibrillator 10/19/2017   Formatting of this note might be different from the original. 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    Family History  Problem Relation Age of Onset   Dementia Mother    Heart Problems Mother    Heart Problems Father     Social History   Socioeconomic History   Marital status: Married    Spouse name: Not on file   Number of children: 3   Years of education: Not on file   Highest education level: Not on file  Occupational History   Not on file  Tobacco Use   Smoking status: Former    Types: Cigarettes    Quit date: 1970    Years since quitting: 52.6   Smokeless tobacco: Never  Substance and Sexual Activity   Alcohol use: Not Currently   Drug use: Never   Sexual activity: Yes    Partners: Female  Other Topics Concern   Not on file  Social History Narrative   Not on file   Social Determinants of Health   Financial Resource Strain: Not on file  Food Insecurity: Not on file  Transportation Needs: Not on file  Physical Activity: Not on file  Stress: Not on file  Social Connections: Not on file  Intimate Partner Violence: Not on file    Outpatient Medications Prior to Visit  Medication Sig Dispense Refill   acetaminophen (TYLENOL) 650 MG CR tablet Take 650 mg by mouth every 8 (eight) hours as needed for pain.     amiodarone (PACERONE) 200 MG tablet Take

## 2021-07-29 ENCOUNTER — Other Ambulatory Visit: Payer: Self-pay | Admitting: Legal Medicine

## 2021-07-29 LAB — TSH: TSH: 11.1 u[IU]/mL — ABNORMAL HIGH (ref 0.450–4.500)

## 2021-07-29 NOTE — Progress Notes (Signed)
Tsh still 11.10 is he taking of levothyroxine lp

## 2021-08-04 ENCOUNTER — Other Ambulatory Visit: Payer: Self-pay

## 2021-08-04 ENCOUNTER — Encounter: Payer: Self-pay | Admitting: Legal Medicine

## 2021-08-04 ENCOUNTER — Ambulatory Visit (INDEPENDENT_AMBULATORY_CARE_PROVIDER_SITE_OTHER): Payer: Medicare HMO | Admitting: Legal Medicine

## 2021-08-04 VITALS — BP 90/60 | HR 60 | Temp 97.3°F | Resp 15 | Ht 74.0 in | Wt 271.0 lb

## 2021-08-04 DIAGNOSIS — S99911D Unspecified injury of right ankle, subsequent encounter: Secondary | ICD-10-CM

## 2021-08-04 MED ORDER — CLOPIDOGREL BISULFATE 75 MG PO TABS: 75.0000 mg | ORAL_TABLET | Freq: Every day | ORAL | 2 refills | Status: DC

## 2021-08-04 MED ORDER — ATORVASTATIN CALCIUM 40 MG PO TABS: 40.0000 mg | ORAL_TABLET | Freq: Every day | ORAL | 2 refills | Status: DC

## 2021-08-04 NOTE — Progress Notes (Signed)
Established Patient Office Visit  Subjective:  Patient ID: Bryan Miller, male    DOB: 02-09-1951  Age: 70 y.o. MRN: [id number]  CC:  Chief Complaint  Patient presents with   Ankle Pain    Right Ankle pain is getting better.    HPI [name] presents for right ankle pain.  It is improving.  He can bear weight.  X-ray shows no fracture.  Past Medical History:  Diagnosis Date   Atherosclerotic heart disease of native coronary artery without angina pectoris 06/01/2011   Formatting of this note might be different from the original. 07/18/12 CT ABD AND PELVIS:  coronary artery calcifications   Dilated aortic root (HCC) 12/05/2018   Hyperlipidemia 08/15/2020   Left bundle branch block 09/14/2019   Formatting of this note might be different from the original. 2011:echo -  EF 40%, 4 chamber dilation, diffuse hypokinesis, worse inferiorly; TX carvedilol, simvastatin 40 mg and aspirin 2020: Echo - mild dilated LV, EF 35-40%, Basal-mid inferolateral, basal inferior/inferoseptal wall hypokinesis; normal RV   Old myocardial infarction 06/26/2011   Formatting of this note might be different from the original. 2004.   Prediabetes 05/05/2010   Presence of cardiac defibrillator 10/19/2017   Formatting of this note might be different from the original. 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    Past Surgical History:  Procedure Laterality Date   ABDOMINAL HERNIA REPAIR  2020   bypass  2001   CHOLECYSTECTOMY  1996   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    Family History  Problem Relation Age of  Onset   Dementia Mother    Heart Problems Mother    Heart Problems Father     Social History   Socioeconomic History   Marital status: Married    Spouse name: Not on file   Number of children: 3   Years of education: Not on file   Highest education level: Not on file  Occupational History   Not on file  Tobacco Use   Smoking status: Former    Types: Cigarettes    Quit date: 1970    Years since quitting: 52.6   Smokeless tobacco: Never  Substance and Sexual Activity   Alcohol use: Not Currently   Drug use: Never   Sexual activity: Yes    Partners: Female  Other Topics Concern   Not on file  Social History Narrative   Not on file   Social Determinants of Health   Financial Resource Strain: Not on file  Food Insecurity: Not on file  Transportation Needs: Not on file  Physical Activity: Not on file  Stress: Not on file  Social Connections: Not on file  Intimate Partner Violence: Not on file    Outpatient Medications Prior to Visit  Medication Sig Dispense Refill   acetaminophen (TYLENOL) 650 MG CR tablet Take 650 mg by mouth every 8 (eight) hours as needed for pain.     amiodarone (PACERONE) 200 MG tablet Take 1 tablet by mouth daily.     aspirin 81 MG EC tablet Take 1 tablet by mouth daily.  furosemide (LASIX) 20 MG tablet Take 1 tablet by mouth daily.     levothyroxine (SYNTHROID) 75 MCG tablet Take 1 tablet (75 mcg total) by mouth daily. 90 tablet 3   lisinopril (ZESTRIL) 2.5 MG tablet Take 2.5 mg by mouth daily.     loratadine (CLARITIN) 10 MG tablet Take 10 mg by mouth daily.     nitroGLYCERIN (NITROSTAT) 0.4 MG SL tablet Place 1 tablet under the tongue as needed.     spironolactone (ALDACTONE) 25 MG tablet Take 0.5 tablets by mouth daily.     atorvastatin (LIPITOR) 40 MG tablet Take 40 mg by mouth daily.     clopidogrel (PLAVIX) 75 MG tablet Take 75 mg by mouth once.     metoprolol tartrate (LOPRESSOR) 25 MG tablet Take 25 mg by mouth in the morning and at  bedtime.     No facility-administered medications prior to visit.    No Known Allergies  ROS Review of Systems  Constitutional:  Negative for chills, fatigue and fever.  HENT:  Negative for congestion, ear pain and sore throat.   Respiratory:  Negative for cough and shortness of breath.   Cardiovascular:  Negative for chest pain.  Gastrointestinal:  Negative for abdominal pain, constipation, diarrhea, nausea and vomiting.  Endocrine: Negative for polydipsia, polyphagia and polyuria.  Genitourinary:  Negative for dysuria and frequency.  Musculoskeletal:  Negative for arthralgias and myalgias.  Neurological:  Negative for dizziness and headaches.  Psychiatric/Behavioral:  Negative for dysphoric mood.        No dysphoria     Objective:    Physical Exam Vitals reviewed.  Constitutional:      Appearance: Normal appearance.  Cardiovascular:     Rate and Rhythm: Normal rate and regular rhythm.     Pulses: Normal pulses.     Heart sounds: Normal heart sounds. No murmur heard.   No gallop.  Pulmonary:     Effort: Pulmonary effort is normal. No respiratory distress.     Breath sounds: Normal breath sounds. No wheezing.  Musculoskeletal:     Comments: Less swelling right ankle.  No pain over deltoid or ATFL.  He is wearing lace-up brace. No instablity  Neurological:     Mental Status: He is alert.    BP 90/60   Pulse 60   Temp (!) 97.3 F (36.3 C)   Resp 15   Ht 6\' 2"  (1.88 m)   Wt 271 lb (122.9 kg)   SpO2 98%   BMI 34.79 kg/m  Wt Readings from Last 3 Encounters:  08/04/21 271 lb (122.9 kg)  07/28/21 270 lb (122.5 kg)  06/26/21 271 lb (122.9 kg)     Health Maintenance Due  Topic Date Due   Hepatitis C Screening  Never done   Zoster Vaccines- Shingrix (1 of 2) Never done   COLONOSCOPY (Pts 45-69yrs Insurance coverage will need to be confirmed)  Never done   INFLUENZA VACCINE  07/27/2021    There are no preventive care reminders to display for this  patient.  Lab Results  Component Value Date   TSH 11.100 (H) 07/28/2021   Lab Results  Component Value Date   WBC 7.9 06/26/2021   HGB 14.9 06/26/2021   HCT 44.6 06/26/2021   MCV 94 06/26/2021   PLT 192 06/26/2021   Lab Results  Component Value Date   NA 144 06/26/2021   K 4.8 06/26/2021   CO2 23 06/26/2021   GLUCOSE 96 06/26/2021   BUN 17 06/26/2021  CREATININE 1.10 06/26/2021   BILITOT 0.5 06/26/2021   ALKPHOS 115 06/26/2021   AST 17 06/26/2021   ALT 12 06/26/2021   PROT 6.6 06/26/2021   ALBUMIN 3.9 06/26/2021   CALCIUM 9.3 06/26/2021   EGFR 72 06/26/2021   Lab Results  Component Value Date   CHOL 142 06/26/2021   Lab Results  Component Value Date   HDL 43 06/26/2021   Lab Results  Component Value Date   LDLCALC 75 06/26/2021   Lab Results  Component Value Date   TRIG 138 06/26/2021   Lab Results  Component Value Date   CHOLHDL 3.3 06/26/2021   Lab Results  Component Value Date   HGBA1C 5.9 (H) 06/26/2021      Assessment & Plan:   Problem List Items Addressed This Visit   None Visit Diagnoses     Injury of right ankle, subsequent encounter    -  Primary  Ankle healing well, continue brace       Follow-up: Return if symptoms worsen or fail to improve.    Brent Bulla, MD

## 2021-09-07 ENCOUNTER — Ambulatory Visit: Payer: Medicare HMO

## 2021-09-10 DIAGNOSIS — I252 Old myocardial infarction: Secondary | ICD-10-CM | POA: Diagnosis not present

## 2021-09-10 DIAGNOSIS — Z95810 Presence of automatic (implantable) cardiac defibrillator: Secondary | ICD-10-CM | POA: Diagnosis not present

## 2021-09-10 DIAGNOSIS — I4589 Other specified conduction disorders: Secondary | ICD-10-CM | POA: Diagnosis not present

## 2021-09-10 DIAGNOSIS — I2510 Atherosclerotic heart disease of native coronary artery without angina pectoris: Secondary | ICD-10-CM | POA: Diagnosis not present

## 2021-09-10 DIAGNOSIS — I472 Ventricular tachycardia: Secondary | ICD-10-CM | POA: Diagnosis not present

## 2021-09-10 DIAGNOSIS — R9431 Abnormal electrocardiogram [ECG] [EKG]: Secondary | ICD-10-CM | POA: Diagnosis not present

## 2021-09-10 DIAGNOSIS — I5022 Chronic systolic (congestive) heart failure: Secondary | ICD-10-CM | POA: Insufficient documentation

## 2021-09-10 DIAGNOSIS — I429 Cardiomyopathy, unspecified: Secondary | ICD-10-CM | POA: Insufficient documentation

## 2021-09-14 ENCOUNTER — Ambulatory Visit: Payer: Medicare HMO

## 2021-09-14 DIAGNOSIS — E038 Other specified hypothyroidism: Secondary | ICD-10-CM

## 2021-09-15 ENCOUNTER — Other Ambulatory Visit: Payer: Self-pay

## 2021-09-15 ENCOUNTER — Other Ambulatory Visit: Payer: Self-pay | Admitting: Legal Medicine

## 2021-09-15 DIAGNOSIS — E038 Other specified hypothyroidism: Secondary | ICD-10-CM

## 2021-09-15 LAB — TSH: TSH: 4.56 u[IU]/mL — ABNORMAL HIGH (ref 0.450–4.500)

## 2021-09-15 MED ORDER — LEVOTHYROXINE SODIUM 88 MCG PO TABS: 88.0000 ug | ORAL_TABLET | Freq: Every day | ORAL | 2 refills | Status: DC

## 2021-09-15 NOTE — Progress Notes (Signed)
TSH 4.56, we can go up to of synthroid, recheck lab at next visit lp

## 2021-09-23 ENCOUNTER — Telehealth: Payer: Self-pay

## 2021-09-23 NOTE — Telephone Encounter (Signed)
Called patient to rescheduled appointment, has been changed to 10/31 @ 3:45 pm

## 2021-09-28 ENCOUNTER — Ambulatory Visit: Payer: Medicare HMO | Admitting: Legal Medicine

## 2021-09-29 ENCOUNTER — Ambulatory Visit: Payer: Medicare HMO | Admitting: Legal Medicine

## 2021-10-01 DIAGNOSIS — Z95810 Presence of automatic (implantable) cardiac defibrillator: Secondary | ICD-10-CM | POA: Diagnosis not present

## 2021-10-26 ENCOUNTER — Ambulatory Visit (INDEPENDENT_AMBULATORY_CARE_PROVIDER_SITE_OTHER): Payer: Medicare HMO | Admitting: Legal Medicine

## 2021-10-26 ENCOUNTER — Encounter: Payer: Self-pay | Admitting: Legal Medicine

## 2021-10-26 ENCOUNTER — Other Ambulatory Visit: Payer: Self-pay

## 2021-10-26 VITALS — BP 102/70 | HR 60 | Temp 97.9°F | Ht 74.0 in | Wt 274.8 lb

## 2021-10-26 DIAGNOSIS — E782 Mixed hyperlipidemia: Secondary | ICD-10-CM

## 2021-10-26 DIAGNOSIS — I2510 Atherosclerotic heart disease of native coronary artery without angina pectoris: Secondary | ICD-10-CM | POA: Diagnosis not present

## 2021-10-26 DIAGNOSIS — E038 Other specified hypothyroidism: Secondary | ICD-10-CM

## 2021-10-26 DIAGNOSIS — R7303 Prediabetes: Secondary | ICD-10-CM | POA: Diagnosis not present

## 2021-10-26 DIAGNOSIS — Z6835 Body mass index (BMI) 35.0-35.9, adult: Secondary | ICD-10-CM

## 2021-10-26 NOTE — Progress Notes (Signed)
Established Patient Office Visit  Subjective:  Patient ID: Bryan Miller, male    DOB: 1951-07-26  Age: 70 y.o. MRN: [id number]  CC:  Chief Complaint  Patient presents with   Hypothyroidism    HPI Bryan Miller presents for chronic visit  Patient has HYPOTHYROIDISM.  Diagnosed 10 years ago.  Patient has stable thyroid readings.  Patient is having none.  Last TSH was normal.  stable dosage of thyroid medicine.   Patient presents with hyperlipidemia.  Compliance with treatment has been good; patient takes medicines as directed, maintains low cholesterol diet, follows up as directed, and maintains exercise regimen.  Patient is using atorvastatin without problems.   Patient has prediabetes CORONARY ARTERY DISEASE  Patient presents in follow up of CAD. Patient was 2001diagnosed i. The patient has no associated CHF. The patient is currently taking a beta blocker, statin, and aspirin. CAD was diagnosed 21 years ago.  Patient is having no angina. Patient has used no NTG.  Patient is followed by cardiology.  Patient had CABG . Last angiography was 2001, last echocardiogram na.   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    Family History  Problem Relation Age of Onset   Dementia Mother    Heart Problems Mother    Heart Problems Father     Social History   Socioeconomic History   Marital status: Married    Spouse name: Not on file   Number of children: 3   Years of education: Not on file   Highest education level: Not on file  Occupational History   Not on file  Tobacco Use   Smoking status: Former    Types: Cigarettes    Quit date: 1970    Years since quitting: 52.8   Smokeless tobacco: Never  Substance and Sexual Activity   Alcohol use: Not Currently   Drug use: Never   Sexual activity: Yes    Partners: Female   Other Topics Concern   Not on file  Social History Narrative   Not on file   Social Determinants of Health   Financial Resource Strain: Not on file  Food Insecurity: Not on file  Transportation Needs: Not on file  Physical Activity: Not on file  Stress: Not on file  Social Connections: Not on file  Intimate Partner Violence: Not on file    Outpatient Medications Prior to Visit  Medication Sig Dispense Refill   acetaminophen (TYLENOL) 650 MG CR tablet Take 650 mg by mouth every 8 (eight) hours as needed for pain.     aspirin 81 MG EC tablet Take 1 tablet by mouth daily.     atorvastatin (LIPITOR) 40 MG tablet Take 1 tablet (40 mg total) by mouth daily. 90 tablet 2   clopidogrel (PLAVIX) 75 MG tablet Take 1 tablet (75 mg total) by mouth daily. 90 tablet 2   furosemide (LASIX) 20 MG tablet Take 1 tablet by mouth daily.     levothyroxine (SYNTHROID) 88 MCG tablet Take 1 tablet (88 mcg total) by mouth daily. 90 tablet 2   lisinopril (ZESTRIL) 2.5 MG tablet Take 2.5 mg by mouth daily.     loratadine (CLARITIN) 10 MG tablet Take 10 mg by mouth daily.     nitroGLYCERIN (NITROSTAT) 0.4 MG SL tablet Place 1 tablet under the  tongue as needed.     spironolactone (ALDACTONE) 25 MG tablet Take 0.5 tablets by mouth daily.     amiodarone (PACERONE) 200 MG tablet Take 1 tablet by mouth daily.     metoprolol tartrate (LOPRESSOR) 25 MG tablet Take 25 mg by mouth in the morning and at bedtime.     No facility-administered medications prior to visit.    No Known Allergies  ROS Review of Systems  Constitutional:  Negative for activity change and appetite change.  HENT:  Negative for congestion and facial swelling.   Eyes:  Negative for visual disturbance.  Respiratory:  Negative for chest tightness, shortness of breath and wheezing.   Cardiovascular:  Negative for chest pain, palpitations and leg swelling.  Gastrointestinal:  Negative for abdominal distention and abdominal pain.   Genitourinary:  Negative for difficulty urinating and urgency.  Musculoskeletal:  Negative for arthralgias and back pain.  Neurological: Negative.   Psychiatric/Behavioral: Negative.       Objective:    Physical Exam Vitals reviewed.  Constitutional:      General: He is not in acute distress.    Appearance: Normal appearance. He is obese.  HENT:     Head: Normocephalic.     Right Ear: Tympanic membrane, ear canal and external ear normal.     Left Ear: Tympanic membrane, ear canal and external ear normal.     Mouth/Throat:     Mouth: Mucous membranes are moist.     Pharynx: Oropharynx is clear.  Eyes:     Extraocular Movements: Extraocular movements intact.     Conjunctiva/sclera: Conjunctivae normal.     Pupils: Pupils are equal, round, and reactive to light.  Cardiovascular:     Rate and Rhythm: Normal rate and regular rhythm.     Pulses: Normal pulses.     Heart sounds: Normal heart sounds. No murmur heard.   No gallop.  Pulmonary:     Effort: Pulmonary effort is normal. No respiratory distress.     Breath sounds: Normal breath sounds. No wheezing.  Abdominal:     General: Abdomen is flat. There is no distension.     Tenderness: There is no abdominal tenderness.  Musculoskeletal:     Cervical back: Normal range of motion.  Neurological:     Mental Status: He is alert.    BP 102/70 (BP Location: Left Arm, Patient Position: Sitting, Cuff Size: Large)   Pulse 60   Temp 97.9 F (36.6 C) (Temporal)   Ht 6\' 2"  (1.88 m)   Wt 274 lb 12.8 oz (124.6 kg)   SpO2 98%   BMI 35.28 kg/m  Wt Readings from Last 3 Encounters:  10/26/21 274 lb 12.8 oz (124.6 kg)  08/04/21 271 lb (122.9 kg)  07/28/21 270 lb (122.5 kg)     Health Maintenance Due  Topic Date Due   Zoster Vaccines- Shingrix (1 of 2) Never done   COLONOSCOPY (Pts 45-97yrs Insurance coverage will need to be confirmed)  Never done    There are no preventive care reminders to display for this patient.  Lab  Results  Component Value Date   TSH 4.560 (H) 09/14/2021   Lab Results  Component Value Date   WBC 7.9 06/26/2021   HGB 14.9 06/26/2021   HCT 44.6 06/26/2021   MCV 94 06/26/2021   PLT 192 06/26/2021   Lab Results  Component Value Date   NA 144 06/26/2021   K 4.8 06/26/2021   CO2 23 06/26/2021   GLUCOSE 96  06/26/2021   BUN 17 06/26/2021   CREATININE 1.10 06/26/2021   BILITOT 0.5 06/26/2021   ALKPHOS 115 06/26/2021   AST 17 06/26/2021   ALT 12 06/26/2021   PROT 6.6 06/26/2021   ALBUMIN 3.9 06/26/2021   CALCIUM 9.3 06/26/2021   EGFR 72 06/26/2021   Lab Results  Component Value Date   CHOL 142 06/26/2021   Lab Results  Component Value Date   HDL 43 06/26/2021   Lab Results  Component Value Date   LDLCALC 75 06/26/2021   Lab Results  Component Value Date   TRIG 138 06/26/2021   Lab Results  Component Value Date   CHOLHDL 3.3 06/26/2021   Lab Results  Component Value Date   HGBA1C 5.9 (H) 06/26/2021      Assessment & Plan:   Problem List Items Addressed This Visit       Cardiovascular and Mediastinum   Atherosclerotic heart disease of native coronary artery without angina pectoris - Primary   Relevant Orders   CBC with Differential/Platelet   Comprehensive metabolic panel An individual plan was formulated based on patient history and exam, labs and evidence based data. Patient has not had recent angina or nitroglycerin use. continue present treatment.      Endocrine   Subclinical hypothyroidism   Relevant Orders   TSH Patient is known to have hypothyroidism and is n treatment with levothyroxine .  Patient was diagnosed 10 years ago.  Other treatment includes none.  Patient is compliant with medicines and last TSH 6 months ago.  Last TSH was normal.      Other   Hyperlipidemia   Relevant Orders   Lipid panel AN INDIVIDUAL CARE PLAN for hyperlipidemia/ cholesterol was established and reinforced today.  The patient's status was assessed using  clinical findings on exam, lab and other diagnostic tests. The patient's disease status was assessed based on evidence-based guidelines and found to be fair controlled. MEDICATIONS were reviewed. SELF MANAGEMENT GOALS have been discussed and patient's success at attaining the goal of low cholesterol was assessed. RECOMMENDATION given include regular exercise 3 days a week and low cholesterol/low fat diet. CLINICAL SUMMARY including written plan to identify barriers unique to the patient due to social or economic  reasons was discussed.    Prediabetes   Relevant Orders   Hemoglobin A1c Continue diet and exercise    BMI 35.0-35.9,adult An individualize plan was formulated for obesity using patient history and physical exam to encourage weight loss.  An evidence based program was formulated.  Patient is to cut portion size with meals and to plan physical exercise 3 days a week at least 20 minutes.  Weight watchers and other programs are helpful.  Planned amount of weight loss 10 lbs. With hypertension and hyperlipidemia, he meets criteria for morbid obesity    Morbid obesity (HCC) An individualize plan was formulated for obesity using patient history and physical exam to encourage weight loss.  An evidence based program was formulated.  Patient is to cut portion size with meals and to plan physical exercise 3 days a week at least 20 minutes.  Weight watchers and other programs are helpful.  Planned amount of weight loss 10 lbs.        Follow-up: Return in about 4 months (around 02/23/2022) for fasting.    Brent Bulla, MD

## 2021-10-27 ENCOUNTER — Other Ambulatory Visit: Payer: Self-pay | Admitting: Legal Medicine

## 2021-10-27 DIAGNOSIS — E038 Other specified hypothyroidism: Secondary | ICD-10-CM

## 2021-10-27 LAB — COMPREHENSIVE METABOLIC PANEL
ALT: 16 IU/L (ref 0–44)
AST: 19 IU/L (ref 0–40)
Albumin/Globulin Ratio: 1.4 (ref 1.2–2.2)
Albumin: 4.1 g/dL (ref 3.7–4.7)
Alkaline Phosphatase: 128 IU/L — ABNORMAL HIGH (ref 44–121)
BUN/Creatinine Ratio: 9 — ABNORMAL LOW (ref 10–24)
BUN: 10 mg/dL (ref 8–27)
Bilirubin Total: 0.9 mg/dL (ref 0.0–1.2)
CO2: 22 mmol/L (ref 20–29)
Calcium: 9.6 mg/dL (ref 8.6–10.2)
Chloride: 101 mmol/L (ref 96–106)
Creatinine, Ser: 1.13 mg/dL (ref 0.76–1.27)
Globulin, Total: 2.9 g/dL (ref 1.5–4.5)
Glucose: 90 mg/dL (ref 70–99)
Potassium: 4.5 mmol/L (ref 3.5–5.2)
Sodium: 139 mmol/L (ref 134–144)
Total Protein: 7 g/dL (ref 6.0–8.5)
eGFR: 69 mL/min/{1.73_m2} (ref >59)

## 2021-10-27 LAB — CBC WITH DIFFERENTIAL/PLATELET
Basophils Absolute: 0 10*3/uL (ref 0.0–0.2)
Basos: 1 %
EOS (ABSOLUTE): 0.3 10*3/uL (ref 0.0–0.4)
Eos: 4 %
Hematocrit: 46 % (ref 37.5–51.0)
Hemoglobin: 15 g/dL (ref 13.0–17.7)
Immature Grans (Abs): 0 10*3/uL (ref 0.0–0.1)
Immature Granulocytes: 0 %
Lymphocytes Absolute: 2.9 10*3/uL (ref 0.7–3.1)
Lymphs: 35 %
MCH: 31.1 pg (ref 26.6–33.0)
MCHC: 32.6 g/dL (ref 31.5–35.7)
MCV: 95 fL (ref 79–97)
Monocytes Absolute: 0.7 10*3/uL (ref 0.1–0.9)
Monocytes: 9 %
Neutrophils Absolute: 4.1 10*3/uL (ref 1.4–7.0)
Neutrophils: 51 %
Platelets: 253 10*3/uL (ref 150–450)
RBC: 4.83 x10E6/uL (ref 4.14–5.80)
RDW: 13 % (ref 11.6–15.4)
WBC: 8.1 10*3/uL (ref 3.4–10.8)

## 2021-10-27 LAB — LIPID PANEL
Chol/HDL Ratio: 3.8 ratio (ref 0.0–5.0)
Cholesterol, Total: 165 mg/dL (ref 100–199)
HDL: 43 mg/dL (ref >39)
LDL Chol Calc (NIH): 92 mg/dL (ref 0–99)
Triglycerides: 172 mg/dL — ABNORMAL HIGH (ref 0–149)
VLDL Cholesterol Cal: 30 mg/dL (ref 5–40)

## 2021-10-27 LAB — CARDIOVASCULAR RISK ASSESSMENT

## 2021-10-27 LAB — HEMOGLOBIN A1C
Est. average glucose Bld gHb Est-mCnc: 120 mg/dL
Hgb A1c MFr Bld: 5.8 % — ABNORMAL HIGH (ref 4.8–5.6)

## 2021-10-27 LAB — TSH: TSH: 4.74 u[IU]/mL — ABNORMAL HIGH (ref 0.450–4.500)

## 2021-10-27 MED ORDER — LEVOTHYROXINE SODIUM 100 MCG PO TABS: 100.0000 ug | ORAL_TABLET | Freq: Every day | ORAL | 2 refills | Status: DC

## 2021-10-27 NOTE — Progress Notes (Signed)
Cbc normal, thyrid is high, need increase thyroid to , kidney and liver tests ok, A1c 5.8 lp

## 2021-12-15 ENCOUNTER — Ambulatory Visit: Payer: Medicare HMO | Admitting: Legal Medicine

## 2021-12-15 ENCOUNTER — Ambulatory Visit (INDEPENDENT_AMBULATORY_CARE_PROVIDER_SITE_OTHER): Payer: Medicare HMO | Admitting: Legal Medicine

## 2021-12-15 ENCOUNTER — Other Ambulatory Visit: Payer: Self-pay

## 2021-12-15 ENCOUNTER — Encounter: Payer: Self-pay | Admitting: Legal Medicine

## 2021-12-15 VITALS — BP 100/60 | HR 64 | Temp 98.4°F | Resp 16 | Ht 74.0 in | Wt 272.0 lb

## 2021-12-15 DIAGNOSIS — Z9189 Other specified personal risk factors, not elsewhere classified: Secondary | ICD-10-CM | POA: Diagnosis not present

## 2021-12-15 DIAGNOSIS — J180 Bronchopneumonia, unspecified organism: Secondary | ICD-10-CM

## 2021-12-15 DIAGNOSIS — R059 Cough, unspecified: Secondary | ICD-10-CM | POA: Diagnosis not present

## 2021-12-15 LAB — POC COVID19 BINAXNOW: SARS Coronavirus 2 Ag: NEGATIVE

## 2021-12-15 MED ORDER — CHERATUSSIN AC 100-10 MG/5ML PO SOLN
5.0000 mL | Freq: Three times a day (TID) | ORAL | 0 refills | Status: DC | PRN
Start: 2021-12-15 — End: 2022-01-10

## 2021-12-15 MED ORDER — AMOXICILLIN-POT CLAVULANATE 875-125 MG PO TABS: 1.0000 | ORAL_TABLET | Freq: Two times a day (BID) | ORAL | 0 refills | Status: DC

## 2021-12-15 MED ORDER — PREDNISONE 10 MG (21) PO TBPK: ORAL_TABLET | ORAL | 0 refills | Status: DC

## 2021-12-15 NOTE — Progress Notes (Signed)
Acute Office Visit  Subjective:    Patient ID: Bryan Miller, male    DOB: [birth year], 70 y.o.   MRN: [id number]  Chief Complaint  Patient presents with   Cough   Fatigue    HPI: Patient is in today for sickness for 2 weeks, some cough and congestion.  Cough productive brown phlegm and he is having some chills.  Past Medical History:  Diagnosis Date   Atherosclerotic heart disease of native coronary artery without angina pectoris 06/01/2011   Formatting of this note might be different from the original. 07/18/12 CT ABD AND PELVIS:  coronary artery calcifications   Dilated aortic root (HCC) 12/05/2018   Hyperlipidemia 08/15/2020   Left bundle branch block 09/14/2019   Formatting of this note might be different from the original. 2011:echo -  EF 40%, 4 chamber dilation, diffuse hypokinesis, worse inferiorly; TX carvedilol, simvastatin 40 mg and aspirin 2020: Echo - mild dilated LV, EF 35-40%, Basal-mid inferolateral, basal inferior/inferoseptal wall hypokinesis; normal RV   Old myocardial infarction 06/26/2011   Formatting of this note might be different from the original. 2004.   Prediabetes 05/05/2010   Presence of cardiac defibrillator 10/19/2017   Formatting of this note might be different from the original. 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    Family History  Problem Relation Age of Onset   Dementia Mother    Heart  Problems Mother    Heart Problems Father     Social History   Socioeconomic History   Marital status: Married    Spouse name: Not on file   Number of children: 3   Years of education: Not on file   Highest education level: Not on file  Occupational History   Not on file  Tobacco Use   Smoking status: Former    Types: Cigarettes    Quit date: 1970    Years since quitting: 53.0   Smokeless tobacco: Never  Substance and Sexual Activity   Alcohol use: Not Currently   Drug use: Never   Sexual activity: Yes    Partners: Female  Other Topics Concern   Not on file  Social History Narrative   Not on file   Social Determinants of Health   Financial Resource Strain: Not on file  Food Insecurity: Not on file  Transportation Needs: Not on file  Physical Activity: Not on file  Stress: Not on file  Social Connections: Not on file  Intimate Partner Violence: Not on file    Outpatient Medications Prior to Visit  Medication Sig Dispense Refill   acetaminophen (TYLENOL) 650 MG CR tablet Take 650 mg by mouth every 8 (eight) hours as needed for pain.     aspirin 81 MG EC tablet Take 1 tablet by mouth daily.     atorvastatin (LIPITOR) 40 MG tablet Take 1 tablet (40 mg total) by mouth daily. 90 tablet 2   clopidogrel (  PLAVIX) 75 MG tablet Take 1 tablet (75 mg total) by mouth daily. 90 tablet 2   furosemide (LASIX) 20 MG tablet Take 1 tablet by mouth daily.     levothyroxine (SYNTHROID) 100 MCG tablet Take 1 tablet (100 mcg total) by mouth daily. Recheck in 6 weeks 90 tablet 2   lisinopril (ZESTRIL) 2.5 MG tablet Take 2.5 mg by mouth daily.     loratadine (CLARITIN) 10 MG tablet Take 10 mg by mouth daily.     nitroGLYCERIN (NITROSTAT) 0.4 MG SL tablet Place 1 tablet under the tongue as needed.     spironolactone (ALDACTONE) 25 MG tablet Take 0.5 tablets by mouth daily.     amiodarone (PACERONE) 200 MG tablet Take 1 tablet by mouth daily.     metoprolol tartrate (LOPRESSOR) 25 MG tablet  Take 25 mg by mouth in the morning and at bedtime.     No facility-administered medications prior to visit.    No Known Allergies  Review of Systems  Constitutional:  Positive for fatigue. Negative for chills, fever and unexpected weight change.  HENT:  Positive for congestion and sinus pressure. Negative for ear pain, sinus pain and sore throat.   Respiratory:  Positive for cough. Negative for shortness of breath.   Cardiovascular:  Negative for chest pain and palpitations.  Gastrointestinal:  Positive for nausea. Negative for abdominal pain, blood in stool, constipation, diarrhea and vomiting.  Endocrine: Negative for polydipsia.  Genitourinary:  Negative for dysuria.  Musculoskeletal:  Negative for back pain.  Skin:  Negative for rash.  Neurological:  Negative for headaches.      Objective:    Physical Exam Vitals reviewed.  Constitutional:      General: He is in acute distress.     Appearance: Normal appearance.  HENT:     Right Ear: Tympanic membrane normal.     Left Ear: Tympanic membrane normal.     Nose: Congestion present.     Mouth/Throat:     Mouth: Mucous membranes are moist.  Eyes:     Extraocular Movements: Extraocular movements intact.     Conjunctiva/sclera: Conjunctivae normal.     Pupils: Pupils are equal, round, and reactive to light.  Cardiovascular:     Rate and Rhythm: Normal rate and regular rhythm.     Pulses: Normal pulses.     Heart sounds: Normal heart sounds. No murmur heard.   No gallop.  Pulmonary:     Effort: Pulmonary effort is normal. No respiratory distress.     Breath sounds: Normal breath sounds. No wheezing.  Abdominal:     General: Abdomen is flat. Bowel sounds are normal. There is no distension.     Palpations: Abdomen is soft.     Tenderness: There is no abdominal tenderness.  Musculoskeletal:     Cervical back: Normal range of motion and neck supple.  Neurological:     Mental Status: He is alert.    BP 100/60    Pulse 64     Temp 98.4 F (36.9 C)    Resp 16    Ht 6\' 2"  (1.88 m)    Wt 272 lb (123.4 kg)    SpO2 98%    BMI 34.92 kg/m  Wt Readings from Last 3 Encounters:  12/15/21 272 lb (123.4 kg)  10/26/21 274 lb 12.8 oz (124.6 kg)  08/04/21 271 lb (122.9 kg)    Health Maintenance Due  Topic Date Due   Zoster Vaccines- Shingrix (1 of 2) Never done  COLONOSCOPY (Pts 45-65yrs Insurance coverage will need to be confirmed)  Never done    There are no preventive care reminders to display for this patient.   Lab Results  Component Value Date   TSH 4.740 (H) 10/26/2021   Lab Results  Component Value Date   WBC 8.1 10/26/2021   HGB 15.0 10/26/2021   HCT 46.0 10/26/2021   MCV 95 10/26/2021   PLT 253 10/26/2021   Lab Results  Component Value Date   NA 139 10/26/2021   K 4.5 10/26/2021   CO2 22 10/26/2021   GLUCOSE 90 10/26/2021   BUN 10 10/26/2021   CREATININE 1.13 10/26/2021   BILITOT 0.9 10/26/2021   ALKPHOS 128 (H) 10/26/2021   AST 19 10/26/2021   ALT 16 10/26/2021   PROT 7.0 10/26/2021   ALBUMIN 4.1 10/26/2021   CALCIUM 9.6 10/26/2021   EGFR 69 10/26/2021   Lab Results  Component Value Date   CHOL 165 10/26/2021   Lab Results  Component Value Date   HDL 43 10/26/2021   Lab Results  Component Value Date   LDLCALC 92 10/26/2021   Lab Results  Component Value Date   TRIG 172 (H) 10/26/2021   Lab Results  Component Value Date   CHOLHDL 3.8 10/26/2021   Lab Results  Component Value Date   HGBA1C 5.8 (H) 10/26/2021       Assessment & Plan:   Problem List Items Addressed This Visit   None Visit Diagnoses     At increased risk of exposure to COVID-19 virus    -  Primary   Relevant Orders   POC COVID-19- negative   Bronchopneumonia       Relevant Medications   amoxicillin-clavulanate (AUGMENTIN) 875-125 MG tablet   guaiFENesin-codeine (CHERATUSSIN AC) 100-10 MG/5ML syrup   Other Relevant Orders   DG Chest 2 View- no pneumonia Patient given duo neb with some  clearing of respirations, treat with augmentin      Meds ordered this encounter  Medications   amoxicillin-clavulanate (AUGMENTIN) 875-125 MG tablet    Sig: Take 1 tablet by mouth 2 (two) times daily.    Dispense:  20 tablet    Refill:  0   predniSONE (STERAPRED UNI-PAK 21 TAB) 10 MG (21) TBPK tablet    Sig: Take 6ills first day , then 5 pills day 2 and then cut down one pill day until gone    Dispense:  21 tablet    Refill:  0   guaiFENesin-codeine (CHERATUSSIN AC) 100-10 MG/5ML syrup    Sig: Take 5 mLs by mouth 3 (three) times daily as needed.    Dispense:  120 mL    Refill:  0    Orders Placed This Encounter  Procedures   DG Chest 2 View   POC COVID-19     Follow-up: Return in about 1 week (around 12/22/2021).  An After Visit Summary was printed and given to the patient.  Brent Bulla, MD [facility] [phone]

## 2021-12-22 ENCOUNTER — Encounter: Payer: Self-pay | Admitting: Legal Medicine

## 2021-12-22 ENCOUNTER — Ambulatory Visit (INDEPENDENT_AMBULATORY_CARE_PROVIDER_SITE_OTHER): Payer: Medicare HMO | Admitting: Legal Medicine

## 2021-12-22 ENCOUNTER — Other Ambulatory Visit: Payer: Self-pay

## 2021-12-22 VITALS — BP 80/60 | HR 60 | Temp 97.8°F | Resp 14 | Ht 74.0 in | Wt 272.0 lb

## 2021-12-22 DIAGNOSIS — J180 Bronchopneumonia, unspecified organism: Secondary | ICD-10-CM | POA: Diagnosis not present

## 2021-12-22 DIAGNOSIS — J40 Bronchitis, not specified as acute or chronic: Secondary | ICD-10-CM | POA: Insufficient documentation

## 2021-12-22 NOTE — Progress Notes (Signed)
Acute Office Visit  Subjective:    Patient ID: Bryan Miller, male    DOB: 08-Oct-1951, 70 y.o.   MRN: [id number]  Chief Complaint  Patient presents with   bronchopneumonia    HPI: Patient is in today for patient had bronchopneumonia and normal chest x-ray.  He was put on Augmentin and Cheratussin.  He feels well  Past Medical History:  Diagnosis Date   Atherosclerotic heart disease of native coronary artery without angina pectoris 06/01/2011   Formatting of this note might be different from the original. 07/18/12 CT ABD AND PELVIS:  coronary artery calcifications   Dilated aortic root (HCC) 12/05/2018   Hyperlipidemia 08/15/2020   Left bundle branch block 09/14/2019   Formatting of this note might be different from the original. 2011:echo -  EF 40%, 4 chamber dilation, diffuse hypokinesis, worse inferiorly; TX carvedilol, simvastatin 40 mg and aspirin 2020: Echo - mild dilated LV, EF 35-40%, Basal-mid inferolateral, basal inferior/inferoseptal wall hypokinesis; normal RV   Old myocardial infarction 06/26/2011   Formatting of this note might be different from the original. 2004.   Prediabetes 05/05/2010   Presence of cardiac defibrillator 10/19/2017   Formatting of this note might be different from the original. 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    Family History  Problem Relation Age of Onset   Dementia Mother    Heart  Problems Mother    Heart Problems Father     Social History   Socioeconomic History   Marital status: Married    Spouse name: Not on file   Number of children: 3   Years of education: Not on file   Highest education level: Not on file  Occupational History   Not on file  Tobacco Use   Smoking status: Former    Types: Cigarettes    Quit date: 1970    Years since quitting: 53.0   Smokeless tobacco: Never  Substance and Sexual Activity   Alcohol use: Not Currently   Drug use: Never   Sexual activity: Yes    Partners: Female  Other Topics Concern   Not on file  Social History Narrative   Not on file   Social Determinants of Health   Financial Resource Strain: Not on file  Food Insecurity: Not on file  Transportation Needs: Not on file  Physical Activity: Not on file  Stress: Not on file  Social Connections: Not on file  Intimate Partner Violence: Not on file    Outpatient Medications Prior to Visit  Medication Sig Dispense Refill   acetaminophen (TYLENOL) 650 MG CR tablet Take 650 mg by mouth every 8 (eight) hours as needed for pain.     amoxicillin-clavulanate (AUGMENTIN) 875-125 MG tablet Take 1 tablet by mouth 2 (two) times daily. 20 tablet 0   aspirin 81 MG EC tablet Take 1 tablet by mouth daily.     atorvastatin (LIPITOR) 40 MG  tablet Take 1 tablet (40 mg total) by mouth daily. 90 tablet 2   clopidogrel (PLAVIX) 75 MG tablet Take 1 tablet (75 mg total) by mouth daily. 90 tablet 2   furosemide (LASIX) 20 MG tablet Take 1 tablet by mouth daily.     guaiFENesin-codeine (CHERATUSSIN AC) 100-10 MG/5ML syrup Take 5 mLs by mouth 3 (three) times daily as needed. 120 mL 0   levothyroxine (SYNTHROID) 100 MCG tablet Take 1 tablet (100 mcg total) by mouth daily. Recheck in 6 weeks 90 tablet 2   lisinopril (ZESTRIL) 2.5 MG tablet Take 2.5 mg by mouth daily.     loratadine (CLARITIN) 10 MG tablet Take 10 mg by mouth daily.     nitroGLYCERIN (NITROSTAT) 0.4 MG SL tablet Place 1  tablet under the tongue as needed.     predniSONE (STERAPRED UNI-PAK 21 TAB) 10 MG (21) TBPK tablet Take 6ills first day , then 5 pills day 2 and then cut down one pill day until gone 21 tablet 0   spironolactone (ALDACTONE) 25 MG tablet Take 0.5 tablets by mouth daily.     amiodarone (PACERONE) 200 MG tablet Take 1 tablet by mouth daily.     metoprolol tartrate (LOPRESSOR) 25 MG tablet Take 25 mg by mouth in the morning and at bedtime.     No facility-administered medications prior to visit.    No Known Allergies  Review of Systems  Constitutional:  Negative for chills, fatigue and fever.  HENT:  Negative for congestion, ear pain, sinus pain, sore throat and trouble swallowing.   Respiratory:  Positive for cough. Negative for shortness of breath.   Cardiovascular:  Negative for chest pain.  Musculoskeletal:  Negative for myalgias.  Neurological:  Negative for headaches.      Objective:    Physical Exam Vitals reviewed.  Constitutional:      General: He is not in acute distress.    Appearance: Normal appearance.  HENT:     Right Ear: Tympanic membrane normal.     Left Ear: Tympanic membrane normal.     Nose: Nose normal.     Mouth/Throat:     Mouth: Mucous membranes are moist.     Pharynx: Oropharynx is clear.  Eyes:     Extraocular Movements: Extraocular movements intact.     Conjunctiva/sclera: Conjunctivae normal.     Pupils: Pupils are equal, round, and reactive to light.  Cardiovascular:     Rate and Rhythm: Normal rate and regular rhythm.     Pulses: Normal pulses.     Heart sounds: Normal heart sounds. No murmur heard.   No gallop.  Pulmonary:     Effort: Pulmonary effort is normal. No respiratory distress.     Breath sounds: No wheezing.  Abdominal:     General: Abdomen is flat. Bowel sounds are normal. There is no distension.     Palpations: Abdomen is soft.     Tenderness: There is no abdominal tenderness.  Musculoskeletal:     Cervical back: Normal range  of motion.     Right lower leg: No edema.     Left lower leg: No edema.  Skin:    General: Skin is warm.     Capillary Refill: Capillary refill takes less than 2 seconds.  Neurological:     General: No focal deficit present.     Mental Status: He is alert and oriented to person, place, and time. Mental status is at baseline.     Motor: No weakness.  BP (!) 80/60    Pulse 60    Temp 97.8 F (36.6 C)    Resp 14    Ht 6\' 2"  (1.88 m)    Wt 272 lb (123.4 kg)    SpO2 98%    BMI 34.92 kg/m  Wt Readings from Last 3 Encounters:  12/22/21 272 lb (123.4 kg)  12/15/21 272 lb (123.4 kg)  10/26/21 274 lb 12.8 oz (124.6 kg)    Health Maintenance Due  Topic Date Due   Zoster Vaccines- Shingrix (1 of 2) Never done   COLONOSCOPY (Pts 45-53yrs Insurance coverage will need to be confirmed)  Never done    There are no preventive care reminders to display for this patient.   Lab Results  Component Value Date   TSH 4.740 (H) 10/26/2021   Lab Results  Component Value Date   WBC 8.1 10/26/2021   HGB 15.0 10/26/2021   HCT 46.0 10/26/2021   MCV 95 10/26/2021   PLT 253 10/26/2021   Lab Results  Component Value Date   NA 139 10/26/2021   K 4.5 10/26/2021   CO2 22 10/26/2021   GLUCOSE 90 10/26/2021   BUN 10 10/26/2021   CREATININE 1.13 10/26/2021   BILITOT 0.9 10/26/2021   ALKPHOS 128 (H) 10/26/2021   AST 19 10/26/2021   ALT 16 10/26/2021   PROT 7.0 10/26/2021   ALBUMIN 4.1 10/26/2021   CALCIUM 9.6 10/26/2021   EGFR 69 10/26/2021   Lab Results  Component Value Date   CHOL 165 10/26/2021   Lab Results  Component Value Date   HDL 43 10/26/2021   Lab Results  Component Value Date   LDLCALC 92 10/26/2021   Lab Results  Component Value Date   TRIG 172 (H) 10/26/2021   Lab Results  Component Value Date   CHOLHDL 3.8 10/26/2021   Lab Results  Component Value Date   HGBA1C 5.8 (H) 10/26/2021       Assessment & Plan:   Problem List Items Addressed This Visit        Respiratory   Bronchopneumonia - Primary  Bronchopneumonia resolved       Follow-up: Return if symptoms worsen or fail to improve.  An After Visit Summary was printed and given to the patient.  Brent Bulla, MD [facility] [phone]

## 2021-12-31 DIAGNOSIS — Z95810 Presence of automatic (implantable) cardiac defibrillator: Secondary | ICD-10-CM | POA: Diagnosis not present

## 2022-01-03 ENCOUNTER — Encounter: Payer: Self-pay | Admitting: Legal Medicine

## 2022-01-05 ENCOUNTER — Emergency Department (HOSPITAL_COMMUNITY): Payer: Medicare HMO

## 2022-01-05 ENCOUNTER — Other Ambulatory Visit: Payer: Self-pay

## 2022-01-05 ENCOUNTER — Inpatient Hospital Stay (HOSPITAL_COMMUNITY)
Admission: EM | Admit: 2022-01-05 | Discharge: 2022-01-10 | DRG: 287 | Disposition: A | Discharge status: Home / Self Care | Payer: Medicare HMO | Attending: Internal Medicine | Admitting: Internal Medicine

## 2022-01-05 DIAGNOSIS — E039 Hypothyroidism, unspecified: Secondary | ICD-10-CM | POA: Diagnosis present

## 2022-01-05 DIAGNOSIS — I2582 Chronic total occlusion of coronary artery: Secondary | ICD-10-CM | POA: Diagnosis present

## 2022-01-05 DIAGNOSIS — I472 Ventricular tachycardia, unspecified: Principal | ICD-10-CM | POA: Diagnosis present

## 2022-01-05 DIAGNOSIS — I447 Left bundle-branch block, unspecified: Secondary | ICD-10-CM | POA: Diagnosis present

## 2022-01-05 DIAGNOSIS — Z20822 Contact with and (suspected) exposure to covid-19: Secondary | ICD-10-CM | POA: Diagnosis present

## 2022-01-05 DIAGNOSIS — Z9049 Acquired absence of other specified parts of digestive tract: Secondary | ICD-10-CM | POA: Diagnosis not present

## 2022-01-05 DIAGNOSIS — N1831 Chronic kidney disease, stage 3a: Secondary | ICD-10-CM

## 2022-01-05 DIAGNOSIS — I420 Dilated cardiomyopathy: Secondary | ICD-10-CM | POA: Diagnosis present

## 2022-01-05 DIAGNOSIS — R55 Syncope and collapse: Secondary | ICD-10-CM | POA: Diagnosis present

## 2022-01-05 DIAGNOSIS — Z95810 Presence of automatic (implantable) cardiac defibrillator: Secondary | ICD-10-CM | POA: Diagnosis present

## 2022-01-05 DIAGNOSIS — E785 Hyperlipidemia, unspecified: Secondary | ICD-10-CM | POA: Diagnosis present

## 2022-01-05 DIAGNOSIS — I5042 Chronic combined systolic (congestive) and diastolic (congestive) heart failure: Secondary | ICD-10-CM | POA: Diagnosis not present

## 2022-01-05 DIAGNOSIS — I255 Ischemic cardiomyopathy: Secondary | ICD-10-CM | POA: Diagnosis not present

## 2022-01-05 DIAGNOSIS — Z6835 Body mass index (BMI) 35.0-35.9, adult: Secondary | ICD-10-CM | POA: Diagnosis not present

## 2022-01-05 DIAGNOSIS — Z79890 Hormone replacement therapy (postmenopausal): Secondary | ICD-10-CM

## 2022-01-05 DIAGNOSIS — I5022 Chronic systolic (congestive) heart failure: Secondary | ICD-10-CM | POA: Diagnosis present

## 2022-01-05 DIAGNOSIS — Z7982 Long term (current) use of aspirin: Secondary | ICD-10-CM

## 2022-01-05 DIAGNOSIS — Z7902 Long term (current) use of antithrombotics/antiplatelets: Secondary | ICD-10-CM | POA: Diagnosis not present

## 2022-01-05 DIAGNOSIS — I25810 Atherosclerosis of coronary artery bypass graft(s) without angina pectoris: Secondary | ICD-10-CM | POA: Diagnosis present

## 2022-01-05 DIAGNOSIS — Z87891 Personal history of nicotine dependence: Secondary | ICD-10-CM

## 2022-01-05 DIAGNOSIS — I77810 Thoracic aortic ectasia: Secondary | ICD-10-CM | POA: Diagnosis present

## 2022-01-05 DIAGNOSIS — T381X5A Adverse effect of thyroid hormones and substitutes, initial encounter: Secondary | ICD-10-CM | POA: Diagnosis present

## 2022-01-05 DIAGNOSIS — E0590 Thyrotoxicosis, unspecified without thyrotoxic crisis or storm: Secondary | ICD-10-CM | POA: Diagnosis present

## 2022-01-05 DIAGNOSIS — I252 Old myocardial infarction: Secondary | ICD-10-CM

## 2022-01-05 DIAGNOSIS — I5020 Unspecified systolic (congestive) heart failure: Secondary | ICD-10-CM | POA: Diagnosis present

## 2022-01-05 DIAGNOSIS — I4729 Other ventricular tachycardia: Secondary | ICD-10-CM | POA: Diagnosis not present

## 2022-01-05 DIAGNOSIS — R0602 Shortness of breath: Secondary | ICD-10-CM | POA: Diagnosis not present

## 2022-01-05 DIAGNOSIS — I2510 Atherosclerotic heart disease of native coronary artery without angina pectoris: Secondary | ICD-10-CM | POA: Diagnosis not present

## 2022-01-05 DIAGNOSIS — E038 Other specified hypothyroidism: Secondary | ICD-10-CM | POA: Diagnosis present

## 2022-01-05 DIAGNOSIS — Z79899 Other long term (current) drug therapy: Secondary | ICD-10-CM

## 2022-01-05 DIAGNOSIS — Z951 Presence of aortocoronary bypass graft: Secondary | ICD-10-CM | POA: Diagnosis not present

## 2022-01-05 DIAGNOSIS — I25119 Atherosclerotic heart disease of native coronary artery with unspecified angina pectoris: Secondary | ICD-10-CM | POA: Diagnosis present

## 2022-01-05 HISTORY — DX: Chronic systolic (congestive) heart failure: I50.22

## 2022-01-05 HISTORY — DX: Atherosclerotic heart disease of native coronary artery without angina pectoris: I25.10

## 2022-01-05 HISTORY — DX: Presence of aortocoronary bypass graft: Z95.1

## 2022-01-05 HISTORY — DX: Chronic kidney disease, stage 3a: N18.31

## 2022-01-05 HISTORY — DX: Hypothyroidism, unspecified: E03.9

## 2022-01-05 LAB — CBC
HCT: 43.9 % (ref 39.0–52.0)
Hemoglobin: 14 g/dL (ref 13.0–17.0)
MCH: 31.7 pg (ref 26.0–34.0)
MCHC: 31.9 g/dL (ref 30.0–36.0)
MCV: 99.5 fL (ref 80.0–100.0)
Platelets: 235 10*3/uL (ref 150–400)
RBC: 4.41 MIL/uL (ref 4.22–5.81)
RDW: 14 % (ref 11.5–15.5)
WBC: 8.2 10*3/uL (ref 4.0–10.5)
nRBC: 0 % (ref 0.0–0.2)

## 2022-01-05 LAB — CBG MONITORING, ED: Glucose-Capillary: 99 mg/dL (ref 70–99)

## 2022-01-05 LAB — BASIC METABOLIC PANEL
Anion gap: 8 (ref 5–15)
BUN: 9 mg/dL (ref 8–23)
CO2: 26 mmol/L (ref 22–32)
Calcium: 9.2 mg/dL (ref 8.9–10.3)
Chloride: 106 mmol/L (ref 98–111)
Creatinine, Ser: 1.3 mg/dL — ABNORMAL HIGH (ref 0.61–1.24)
GFR, Estimated: 59 mL/min — ABNORMAL LOW (ref >60)
Glucose, Bld: 94 mg/dL (ref 70–99)
Potassium: 4.2 mmol/L (ref 3.5–5.1)
Sodium: 140 mmol/L (ref 135–145)

## 2022-01-05 LAB — BRAIN NATRIURETIC PEPTIDE: B Natriuretic Peptide: 217.4 pg/mL — ABNORMAL HIGH (ref 0.0–100.0)

## 2022-01-05 LAB — TROPONIN I (HIGH SENSITIVITY)
Troponin I (High Sensitivity): 4 ng/L (ref <18)
Troponin I (High Sensitivity): 5 ng/L (ref <18)

## 2022-01-05 MED ORDER — METOPROLOL TARTRATE 25 MG PO TABS
25.0000 mg | ORAL_TABLET | Freq: Once | ORAL | Status: AC
  Administered 2022-01-06: 25 mg via ORAL
  Filled 2022-01-05: qty 1

## 2022-01-05 NOTE — ED Notes (Signed)
Spoke to [employer] regarding report, report was faxed at [phone], did not receive. Fax being resent now

## 2022-01-05 NOTE — ED Notes (Signed)
Cards at bedside

## 2022-01-05 NOTE — ED Triage Notes (Signed)
Pt. 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

## 2022-01-05 NOTE — ED Notes (Signed)
Contacted [employer] regarding interrogation report; attempting fax again

## 2022-01-05 NOTE — ED Provider Notes (Signed)
[facility] EMERGENCY DEPARTMENT Provider Note   CSN: [id number] Arrival date & time: 01/05/22  [id number]     History  Chief Complaint  Patient presents with   Dizziness    Bryan Miller is a 71 y.o. male.  HPI Patient history of ventricular tachycardia with CRT-D / Bi-Ventricular Defibrillator.  Prior history of coronary artery disease with MI and CABG in 2001.  Hyperlipidemia.  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  He reports he was at school and had sequential episodes of coming very close to passing out.  He felt fine in the day and sitting at his desk suddenly felt that he might pass out.  The symptoms passed after a few minutes.  He was able to get up and was going to proceed with other activities when it occurred suddenly again.  He sat back down and within a few minutes had 1 more attack.  At that point he determined he needed to come to the emergency department.  Patient denies he had associated chest pain or shortness of breath.  He just felt intensely that he would pass out.  He did not perceive racing or skipping of the heart.  He did not perceive any shocks from his defibrillator.  At this time symptoms are resolved.    Home Medications Prior to Admission medications   Medication Sig Start Date End Date Taking? Authorizing Provider  acetaminophen (TYLENOL) 650 MG CR tablet Take 650 mg by mouth every 8 (eight) hours as needed for pain.    [provider]  amiodarone (PACERONE) 200 MG tablet Take 1 tablet by mouth daily. 08/29/20 09/16/21  [provider]  amoxicillin-clavulanate (AUGMENTIN) 875-125 MG tablet Take 1 tablet by mouth 2 (two) times daily. 12/15/21   Lillard Anes, MD  aspirin 81 MG EC tablet Take 1 tablet by mouth daily. 03/13/20   [provider]  atorvastatin (LIPITOR) 40 MG tablet Take 1 tablet (40 mg total) by mouth daily. 08/04/21   Lillard Anes, MD  clopidogrel (PLAVIX) 75 MG tablet Take 1  tablet (75 mg total) by mouth daily. 08/04/21   Lillard Anes, MD  furosemide (LASIX) 20 MG tablet Take 1 tablet by mouth daily. 08/28/20   [provider]  guaiFENesin-codeine (CHERATUSSIN AC) 100-10 MG/5ML syrup Take 5 mLs by mouth 3 (three) times daily as needed. 12/15/21   Lillard Anes, MD  levothyroxine (SYNTHROID) 100 MCG tablet Take 1 tablet (100 mcg total) by mouth daily. Recheck in 6 weeks 10/27/21   Lillard Anes, MD  lisinopril (ZESTRIL) 2.5 MG tablet Take 2.5 mg by mouth daily. 03/09/21   [provider]  loratadine (CLARITIN) 10 MG tablet Take 10 mg by mouth daily.    [provider]  metoprolol tartrate (LOPRESSOR) 25 MG tablet Take 25 mg by mouth in the morning and at bedtime. 02/20/21 03/22/21  [provider]  nitroGLYCERIN (NITROSTAT) 0.4 MG SL tablet Place 1 tablet under the tongue as needed. 02/27/21   [provider]  predniSONE (STERAPRED UNI-PAK 21 TAB) 10 MG (21) TBPK tablet Take 6ills first day , then 5 pills day 2 and then cut down one pill day until gone 12/15/21   Lillard Anes, MD  spironolactone (ALDACTONE) 25 MG tablet Take 0.5 tablets by mouth daily. 03/03/21   [provider]      Allergies    Patient has no known allergies.    Review of Systems  Review of Systems 10 systems reviewed negative except as per HPI Physical Exam Updated Vital Signs BP 114/77    Pulse (!) 56    Temp (!) 97.5 F (36.4 C) (Oral)    Resp 12    SpO2 95%  Physical Exam Constitutional:      Comments: Alert nontoxic.  Clear mental status.  No respiratory distress  HENT:     Head: Normocephalic and atraumatic.     Mouth/Throat:     Mouth: Mucous membranes are moist.     Pharynx: Oropharynx is clear.  Eyes:     Extraocular Movements: Extraocular movements intact.  Cardiovascular:     Rate and Rhythm: Normal rate and regular rhythm.  Pulmonary:     Effort: Pulmonary effort is normal.     Breath sounds:  Normal breath sounds.  Abdominal:     General: There is no distension.     Palpations: Abdomen is soft.     Tenderness: There is no abdominal tenderness. There is no guarding.  Musculoskeletal:        General: No swelling. Normal range of motion.     Right lower leg: Edema present.     Left lower leg: Edema present.     Comments: 1+ edema of the lower extremities.  Calves soft and nontender.  Skin:    General: Skin is warm and dry.  Neurological:     General: No focal deficit present.     Mental Status: He is oriented to person, place, and time.     Cranial Nerves: No cranial nerve deficit.     Motor: No weakness.     Coordination: Coordination normal.  Psychiatric:        Mood and Affect: Mood normal.    ED Results / Procedures / Treatments   Labs (all labs ordered are listed, but only abnormal results are displayed) Labs Reviewed  BASIC METABOLIC PANEL - Abnormal; Notable for the following components:      Result Value   Creatinine, Ser 1.30 (*)    GFR, Estimated 59 (*)    All other components within normal limits  CBC  URINALYSIS, ROUTINE W REFLEX MICROSCOPIC  CBG MONITORING, ED  TROPONIN I (HIGH SENSITIVITY)  TROPONIN I (HIGH SENSITIVITY)    EKG EKG Interpretation  Date/Time:  Tuesday January 05 2022 09:36:44 EST Ventricular Rate:  76 PR Interval:  136 QRS Duration: 180 QT Interval:  474 QTC Calculation: 533 R Axis:   -88 Text Interpretation: Sinus rhythm with marked sinus arrhythmia with frequent AV dual-paced complexes Left axis deviation Right bundle branch block Inferior infarct , age undetermined Abnormal ECG No previous ECGs available paced with self limited episode wide complex tachycardia Confirmed by Charlesetta Shanks [id number]) on 01/05/2022 7:41:13 PM  Radiology No results found.  Procedures Procedures  CRITICAL CARE Performed by: Charlesetta Shanks   Total critical care time: 33 minutes  Critical care time was exclusive of separately billable  procedures and treating other patients.  Critical care was necessary to treat or prevent imminent or life-threatening deterioration.  Critical care was time spent personally by me on the following activities: development of treatment plan with patient and/or surrogate as well as nursing, discussions with consultants, evaluation of patient's response to treatment, examination of patient, obtaining history from patient or surrogate, ordering and performing treatments and interventions, ordering and review of laboratory studies, ordering and review of radiographic studies, pulse oximetry and re-evaluation of patient's condition.   Medications Ordered in ED Medications - No  data to display  ED Course/ Medical Decision Making/ A&P                           Medical Decision Making  He has prior coronary artery disease with MI and CABG.  Episodes of sudden presyncope are concerning for dysrhythmia.  EKG does show a 4 beat run of VT versus sequential PVC.  Troponins are negative.  Patient does not have ongoing chest pain.  Patient has taken his morning amiodarone dose.  Will administer his evening metoprolol.  Awaiting pacer interrogation for additional disposition planning.  Dr. Clayton Bibles at bedside performing pacer interrogation.  He advises patient is having multiple episodes of nonsustained ventricular tachycardia in patient with AICD.  Recommendation per cardiology as a load and drip of amiodarone.  Patient will be admitted to cardiology service.   Final Clinical Impression(s) / ED Diagnoses Final diagnoses:  Near syncope  Nonsustained ventricular tachycardia    Rx / DC Orders ED Discharge Orders     None         Charlesetta Shanks, MD 01/06/22 0008

## 2022-01-05 NOTE — ED Notes (Signed)
Cardiologist at bedside.  

## 2022-01-06 ENCOUNTER — Inpatient Hospital Stay (HOSPITAL_COMMUNITY): Payer: Medicare HMO

## 2022-01-06 ENCOUNTER — Ambulatory Visit: Payer: Medicare HMO | Admitting: Physician Assistant

## 2022-01-06 DIAGNOSIS — Z6835 Body mass index (BMI) 35.0-35.9, adult: Secondary | ICD-10-CM | POA: Diagnosis not present

## 2022-01-06 DIAGNOSIS — E785 Hyperlipidemia, unspecified: Secondary | ICD-10-CM | POA: Diagnosis present

## 2022-01-06 DIAGNOSIS — I25810 Atherosclerosis of coronary artery bypass graft(s) without angina pectoris: Secondary | ICD-10-CM | POA: Diagnosis present

## 2022-01-06 DIAGNOSIS — E039 Hypothyroidism, unspecified: Secondary | ICD-10-CM | POA: Diagnosis present

## 2022-01-06 DIAGNOSIS — I420 Dilated cardiomyopathy: Secondary | ICD-10-CM | POA: Diagnosis present

## 2022-01-06 DIAGNOSIS — E038 Other specified hypothyroidism: Secondary | ICD-10-CM | POA: Diagnosis present

## 2022-01-06 DIAGNOSIS — I447 Left bundle-branch block, unspecified: Secondary | ICD-10-CM | POA: Diagnosis present

## 2022-01-06 DIAGNOSIS — Z7982 Long term (current) use of aspirin: Secondary | ICD-10-CM | POA: Diagnosis not present

## 2022-01-06 DIAGNOSIS — I5042 Chronic combined systolic (congestive) and diastolic (congestive) heart failure: Secondary | ICD-10-CM

## 2022-01-06 DIAGNOSIS — I472 Ventricular tachycardia, unspecified: Secondary | ICD-10-CM | POA: Diagnosis present

## 2022-01-06 DIAGNOSIS — I252 Old myocardial infarction: Secondary | ICD-10-CM | POA: Diagnosis not present

## 2022-01-06 DIAGNOSIS — I5022 Chronic systolic (congestive) heart failure: Secondary | ICD-10-CM | POA: Diagnosis present

## 2022-01-06 DIAGNOSIS — E0590 Thyrotoxicosis, unspecified without thyrotoxic crisis or storm: Secondary | ICD-10-CM | POA: Diagnosis present

## 2022-01-06 DIAGNOSIS — R55 Syncope and collapse: Secondary | ICD-10-CM | POA: Diagnosis present

## 2022-01-06 DIAGNOSIS — Z7902 Long term (current) use of antithrombotics/antiplatelets: Secondary | ICD-10-CM | POA: Diagnosis not present

## 2022-01-06 DIAGNOSIS — Z951 Presence of aortocoronary bypass graft: Secondary | ICD-10-CM | POA: Diagnosis not present

## 2022-01-06 DIAGNOSIS — I2510 Atherosclerotic heart disease of native coronary artery without angina pectoris: Secondary | ICD-10-CM | POA: Diagnosis present

## 2022-01-06 DIAGNOSIS — I2582 Chronic total occlusion of coronary artery: Secondary | ICD-10-CM | POA: Diagnosis present

## 2022-01-06 DIAGNOSIS — N1831 Chronic kidney disease, stage 3a: Secondary | ICD-10-CM | POA: Diagnosis present

## 2022-01-06 DIAGNOSIS — I77810 Thoracic aortic ectasia: Secondary | ICD-10-CM | POA: Diagnosis present

## 2022-01-06 DIAGNOSIS — Z20822 Contact with and (suspected) exposure to covid-19: Secondary | ICD-10-CM | POA: Diagnosis present

## 2022-01-06 DIAGNOSIS — I255 Ischemic cardiomyopathy: Secondary | ICD-10-CM | POA: Diagnosis present

## 2022-01-06 DIAGNOSIS — Z95810 Presence of automatic (implantable) cardiac defibrillator: Secondary | ICD-10-CM | POA: Diagnosis not present

## 2022-01-06 DIAGNOSIS — Z9049 Acquired absence of other specified parts of digestive tract: Secondary | ICD-10-CM | POA: Diagnosis not present

## 2022-01-06 LAB — LIPID PANEL
Cholesterol: 121 mg/dL (ref 0–200)
HDL: 36 mg/dL — ABNORMAL LOW (ref >40)
LDL Cholesterol: 63 mg/dL (ref 0–99)
Total CHOL/HDL Ratio: 3.4 RATIO
Triglycerides: 111 mg/dL (ref <150)
VLDL: 22 mg/dL (ref 0–40)

## 2022-01-06 LAB — ECHOCARDIOGRAM COMPLETE
AR max vel: 2.33 cm2
AV Area VTI: 2.35 cm2
AV Area mean vel: 2.27 cm2
AV Mean grad: 1 mmHg
AV Peak grad: 2.6 mmHg
Ao pk vel: 0.81 m/s
Area-P 1/2: 2.46 cm2
Height: 74 in
S' Lateral: 5.5 cm
Single Plane A4C EF: 29.4 %
Weight: 4232.83 oz

## 2022-01-06 LAB — URINALYSIS, MICROSCOPIC (REFLEX): RBC / HPF: NONE SEEN RBC/hpf (ref 0–5)

## 2022-01-06 LAB — BASIC METABOLIC PANEL
Anion gap: 6 (ref 5–15)
BUN: 9 mg/dL (ref 8–23)
CO2: 25 mmol/L (ref 22–32)
Calcium: 8.6 mg/dL — ABNORMAL LOW (ref 8.9–10.3)
Chloride: 107 mmol/L (ref 98–111)
Creatinine, Ser: 1.02 mg/dL (ref 0.61–1.24)
GFR, Estimated: >60 mL/min (ref >60)
Glucose, Bld: 91 mg/dL (ref 70–99)
Potassium: 4.2 mmol/L (ref 3.5–5.1)
Sodium: 138 mmol/L (ref 135–145)

## 2022-01-06 LAB — HEMOGLOBIN A1C
Hgb A1c MFr Bld: 5.8 % — ABNORMAL HIGH (ref 4.8–5.6)
Mean Plasma Glucose: 119.76 mg/dL

## 2022-01-06 LAB — URINALYSIS, ROUTINE W REFLEX MICROSCOPIC
Bilirubin Urine: NEGATIVE
Glucose, UA: NEGATIVE mg/dL
Ketones, ur: NEGATIVE mg/dL
Leukocytes,Ua: NEGATIVE
Nitrite: NEGATIVE
Protein, ur: NEGATIVE mg/dL
Specific Gravity, Urine: 1.02 (ref 1.005–1.030)
pH: 5.5 (ref 5.0–8.0)

## 2022-01-06 LAB — T4, FREE: Free T4: 1.84 ng/dL — ABNORMAL HIGH (ref 0.61–1.12)

## 2022-01-06 LAB — RESP PANEL BY RT-PCR (FLU A&B, COVID) ARPGX2
Influenza A by PCR: NEGATIVE
Influenza B by PCR: NEGATIVE
SARS Coronavirus 2 by RT PCR: NEGATIVE

## 2022-01-06 LAB — ECHOCARDIOGRAM LIMITED
Height: 74 in
Weight: 4232.83 oz

## 2022-01-06 LAB — TSH: TSH: 0.222 u[IU]/mL — ABNORMAL LOW (ref 0.350–4.500)

## 2022-01-06 LAB — MAGNESIUM: Magnesium: 2 mg/dL (ref 1.7–2.4)

## 2022-01-06 MED ORDER — SPIRONOLACTONE 25 MG PO TABS
25.0000 mg | ORAL_TABLET | Freq: Every day | ORAL | Status: DC
  Administered 2022-01-06: 25 mg via ORAL
  Filled 2022-01-06 (×2): qty 1

## 2022-01-06 MED ORDER — SODIUM CHLORIDE 0.9% FLUSH
3.0000 mL | Freq: Two times a day (BID) | INTRAVENOUS | Status: DC
  Administered 2022-01-06 – 2022-01-08 (×5): 3 mL via INTRAVENOUS

## 2022-01-06 MED ORDER — FUROSEMIDE 10 MG/ML IJ SOLN
40.0000 mg | Freq: Once | INTRAMUSCULAR | Status: AC
  Administered 2022-01-06: 40 mg via INTRAVENOUS
  Filled 2022-01-06: qty 4

## 2022-01-06 MED ORDER — PERFLUTREN LIPID MICROSPHERE
1.0000 mL | INTRAVENOUS | Status: AC | PRN
  Administered 2022-01-06: 3 mL via INTRAVENOUS
  Filled 2022-01-06: qty 10

## 2022-01-06 MED ORDER — ACETAMINOPHEN 325 MG PO TABS
650.0000 mg | ORAL_TABLET | ORAL | Status: DC | PRN
  Administered 2022-01-06 – 2022-01-08 (×3): 650 mg via ORAL
  Filled 2022-01-06 (×4): qty 2

## 2022-01-06 MED ORDER — METOPROLOL SUCCINATE ER 25 MG PO TB24
25.0000 mg | ORAL_TABLET | Freq: Every day | ORAL | Status: DC
  Administered 2022-01-07: 25 mg via ORAL
  Filled 2022-01-06 (×2): qty 1

## 2022-01-06 MED ORDER — SODIUM CHLORIDE 0.9% FLUSH: 3.0000 mL | INTRAVENOUS | Status: DC | PRN

## 2022-01-06 MED ORDER — SODIUM CHLORIDE 0.9 % IV SOLN
250.0000 mL | INTRAVENOUS | Status: DC | PRN
  Administered 2022-01-07: 20 mL via INTRAVENOUS

## 2022-01-06 MED ORDER — AMIODARONE HCL IN DEXTROSE 360-4.14 MG/200ML-% IV SOLN
60.0000 mg/h | INTRAVENOUS | Status: AC
  Administered 2022-01-06 (×2): 60 mg/h via INTRAVENOUS
  Filled 2022-01-06 (×2): qty 200

## 2022-01-06 MED ORDER — AMIODARONE LOAD VIA INFUSION
150.0000 mg | Freq: Once | INTRAVENOUS | Status: AC
Start: 2022-01-06 — End: 2022-01-06
  Administered 2022-01-06: 150 mg via INTRAVENOUS
  Filled 2022-01-06: qty 83.34

## 2022-01-06 MED ORDER — ONDANSETRON HCL 4 MG/2ML IJ SOLN
4.0000 mg | Freq: Four times a day (QID) | INTRAMUSCULAR | Status: DC | PRN
  Administered 2022-01-06: 4 mg via INTRAVENOUS
  Filled 2022-01-06: qty 2

## 2022-01-06 MED ORDER — ATORVASTATIN CALCIUM 40 MG PO TABS
40.0000 mg | ORAL_TABLET | Freq: Every day | ORAL | Status: DC
  Administered 2022-01-06 – 2022-01-10 (×5): 40 mg via ORAL
  Filled 2022-01-06 (×5): qty 1

## 2022-01-06 MED ORDER — LEVOTHYROXINE SODIUM 88 MCG PO TABS
88.0000 ug | ORAL_TABLET | Freq: Every day | ORAL | Status: DC
  Administered 2022-01-07 – 2022-01-10 (×4): 88 ug via ORAL
  Filled 2022-01-06 (×4): qty 1

## 2022-01-06 MED ORDER — CLOPIDOGREL BISULFATE 75 MG PO TABS
75.0000 mg | ORAL_TABLET | Freq: Every day | ORAL | Status: DC
  Administered 2022-01-06 – 2022-01-10 (×5): 75 mg via ORAL
  Filled 2022-01-06 (×5): qty 1

## 2022-01-06 MED ORDER — ASPIRIN EC 81 MG PO TBEC
81.0000 mg | DELAYED_RELEASE_TABLET | Freq: Every day | ORAL | Status: DC
  Administered 2022-01-06 – 2022-01-10 (×5): 81 mg via ORAL
  Filled 2022-01-06 (×5): qty 1

## 2022-01-06 MED ORDER — ENOXAPARIN SODIUM 40 MG/0.4ML IJ SOSY
40.0000 mg | PREFILLED_SYRINGE | INTRAMUSCULAR | Status: DC
  Administered 2022-01-06 – 2022-01-09 (×3): 40 mg via SUBCUTANEOUS
  Filled 2022-01-06 (×4): qty 0.4

## 2022-01-06 MED ORDER — AMIODARONE HCL IN DEXTROSE 360-4.14 MG/200ML-% IV SOLN
30.0000 mg/h | INTRAVENOUS | Status: AC
  Administered 2022-01-06 (×3): 30 mg/h via INTRAVENOUS
  Filled 2022-01-06 (×4): qty 200

## 2022-01-06 MED ORDER — LEVOTHYROXINE SODIUM 100 MCG PO TABS
100.0000 ug | ORAL_TABLET | Freq: Every day | ORAL | Status: DC
  Administered 2022-01-06: 100 ug via ORAL
  Filled 2022-01-06: qty 1

## 2022-01-06 MED ORDER — AMIODARONE LOAD VIA INFUSION
150.0000 mg | Freq: Once | INTRAVENOUS | Status: AC
  Administered 2022-01-06: 150 mg via INTRAVENOUS
  Filled 2022-01-06: qty 83.34

## 2022-01-06 NOTE — Progress Notes (Signed)
Patient having runs of (AIVR) accelerated idoventricular rhythm (per Dr Caryl Comes).    Oda Kilts, PA notified.  Verbal order to give 150 mg iv amiodarone bolus received.

## 2022-01-06 NOTE — Consult Note (Addendum)
ELECTROPHYSIOLOGY CONSULT NOTE    Patient ID: Bryan Miller MRN: [id number], DOB/AGE: 72-Aug-1952 72 y.o.  Admit date: 01/05/2022 Date of Consult: 01/06/2022  Primary Physician: Abigail Miyamoto, MD Primary Cardiologist: [facility] in [place] (Dr Hoy Finlay (interventional cardiology at [facility] heart and vascular) [phone]) Electrophysiologist: [facility] in [place] (? Dr. Christie Beckers)  Referring Provider: Dr. Joyce Gross  Patient Profile: KHOLE CANIZARES is a 71 y.o. male with hx of ICM s/p Bsci CRT-D, CAD s/p 2v CABG (LIMA-->LAD, SVG-->RCA (occluded)), CKD III, and prior VT who is being seen 01/06/2022 for the evaluation of ventricular tachycardia at the request of Dr. Joyce Gross  HPI:  JOSHUAMICHAEL PATRICE is a 71 y.o. male with medical history as above. Followed outside of our system.   Pt had CABGx 2 20 years ago.  Postoperatively had systolic CHF and has been on GDMT since, at times limited by BP. CRT placed in [place] approx 5 years ago for primary prevention, though also states has been on sotalol for VT suppression.  Switched to amiodarone several years ago by his best knowledge after VT/VF treated by device.   Seen in [place] approx 5 years ago with CP and vein graft had occluded. Per outside cath report there was extensive thrombus and the risks of intervening felt to outweigh the benefits.  Mr. Urlacher and his wife moved back to [place] about a year ago and he has been following at [facility] since.   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 Stable with ADLs most days over the past few months, but at times has to nap in the afternoon. Normally can take steps without undoe SOB.  Recent increase in fatigue, worse over past 2 weeks with episode of lightheadedness and near syncope.  Had multiple recurrent episodes, and yesterday had at least 3 back to back while teaching. No CP. SOB slightly worse over past several days.  With recurrent near syncope presented to  [facility] and found to have multiple episodes of VT requiring ATP. BiV% 95.   Pertinent labs on admission include COVID negative, TSH 0.222 (low), Free T4 1.84. BMET still pending. Mg 2.0, K 4.2, Cr 1.30. HS trop negative  Bolused on IV amiodarone and repeat echo ordered.  Currently feeling OK. Denies frank syncope. Reports history of VT and arrest in the past. Was initially on amiodarone, stopped for nausea and switched to sotalol, then switched BACK to amiodarone with therapy per patient. Denies chest pain, but did not have any chest pain prior to his CABG. Recent cath 2020 with patent LIMA-LAD  Past Medical History:  Diagnosis Date   Atherosclerotic heart disease of native coronary artery without angina pectoris 06/01/2011   Formatting of this note might be different from the original. 07/18/12 CT ABD AND PELVIS:  coronary artery calcifications   Dilated aortic root (HCC) 12/05/2018   Hyperlipidemia 08/15/2020   Left bundle branch block 09/14/2019   Formatting of this note might be different from the original. 2011:echo -  EF 40%, 4 chamber dilation, diffuse hypokinesis, worse inferiorly; TX carvedilol, simvastatin 40 mg and aspirin 2020: Echo - mild dilated LV, EF 35-40%, Basal-mid inferolateral, basal inferior/inferoseptal wall hypokinesis; normal RV   Old myocardial infarction 06/26/2011   Formatting of this note might be different from the original. 2004.   Prediabetes 05/05/2010   Presence of cardiac defibrillator 10/19/2017   Formatting of this note might be different from the original. PRE-OPERATIVE RECOMMENDATION    Pt is not  dependent on device   ICD: PROCEDURES if procedure is in the neck to hip region and using cautery    -Magnet applied over an ICD will disable shocking therapies only  -If electrocautery required, place grounding pad below the umbilicus, use bipolar cautery and limit use to short irregular bursts   Subclinical hypothyroidism 04/24/2019   Ventricular tachycardia, monomorphic  09/14/2019     Surgical History:  Past Surgical History:  Procedure Laterality Date   ABDOMINAL HERNIA REPAIR  2020   bypass  2001   CHOLECYSTECTOMY  1996   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     (Not in a hospital admission)   Inpatient Medications:   aspirin EC  81 mg Oral Daily   atorvastatin  40 mg Oral Daily   clopidogrel  75 mg Oral Daily   enoxaparin (LOVENOX) injection  40 mg Subcutaneous Q24H   levothyroxine  100 mcg Oral Q0600   [START ON 01/07/2022] metoprolol succinate  25 mg Oral Daily   sodium chloride flush  3 mL Intravenous Q12H   spironolactone  25 mg Oral Daily    Allergies: No Known Allergies  Social History   Socioeconomic History   Marital status: Married    Spouse name: Not on file   Number of children: 3   Years of education: Not on file   Highest education level: Not on file  Occupational History   Not on file  Tobacco Use   Smoking status: Former    Types: Cigarettes    Quit date: 1970    Years since quitting: 53.0   Smokeless tobacco: Never  Substance and Sexual Activity   Alcohol use: Not Currently   Drug use: Never   Sexual activity: Yes    Partners: Female  Other Topics Concern   Not on file  Social History Narrative   Not on file   Social Determinants of Health   Financial Resource Strain: Not on file  Food Insecurity: Not on file  Transportation Needs: Not on file  Physical Activity: Not on file  Stress: Not on file  Social Connections: Not on file  Intimate Partner Violence: Not on file     Family History  Problem Relation Age of Onset   Dementia Mother    Heart Problems Mother    Heart Problems Father      Review of Systems: All other systems reviewed and are otherwise negative except as noted above.  Physical Exam: Vitals:   01/06/22 0745 01/06/22 0815 01/06/22 0830 01/06/22 0845  BP: 108/74 101/75 106/69 105/66  Pulse: (!) 59 62 62 62  Resp: 14 18 18 17   Temp:      TempSrc:      SpO2: 99% 98%  100% 100%  Weight:      Height:        GEN- The patient is well appearing, alert and oriented x 3 today.   HEENT: normocephalic, atraumatic; sclera clear, conjunctiva pink; hearing intact; oropharynx clear; neck supple Lungs- Clear to ausculation bilaterally, normal work of breathing.  No wheezes, rales, rhonchi Heart- Regular rate and rhythm, no murmurs, rubs or gallops GI- soft, non-tender, non-distended, bowel sounds present Extremities- no clubbing, cyanosis, or edema; DP/PT/radial pulses 2+ bilaterally MS- no significant deformity or atrophy Skin- warm and dry, no rash or lesion Psych- euthymic mood, full affect Neuro- strength and sensation are intact  Labs:   Lab Results  Component Value Date   WBC 8.2 01/05/2022   HGB 14.0 01/05/2022  HCT 43.9 01/05/2022   MCV 99.5 01/05/2022   PLT 235 01/05/2022    Recent Labs  Lab 01/05/22 0957  NA 140  K 4.2  CL 106  CO2 26  BUN 9  CREATININE 1.30*  CALCIUM 9.2  GLUCOSE 94    Radiology/Studies: DG Chest 2 View  Result Date: 01/05/2022 CLINICAL DATA:  Shortness of breath EXAM: CHEST - 2 VIEW COMPARISON:  12/15/2021 FINDINGS: Left AICD remains in place, unchanged. Prior CABG. Heart is normal size. Lungs clear. No effusions or acute bony abnormality. IMPRESSION: No active cardiopulmonary disease. Electronically Signed   By: Charlett Nose M.D.   On: 01/05/2022 20:30    EKG: on arrival shows NSR with NSVT and PVC at 76 bpm (personally reviewed)  TELEMETRY: NSR/Biv Paced mostly, is having WCT as slow as 100-110 range (personally reviewed)  DEVICE HISTORY: BSx CRT-D implanted 2018 in [place]  Cath Report 08/2016 ([place]) CORONARY ANGIOGRAPHY FINDINGS:  Dominance: right  Left Main Artery: mild disease  Left Anterior Descending Artery: 100% middle third  Diagonal 1: 50%  Left Circumflex Artery: 40% middle third  Right Coronary Artery: 100%  Grafts:  LIMA-LAD: patent. Moderate diffuse disease in the native LAD.  Severe distal  native disease.  SVG-RCA: severe diffuse disease in the graft with poor (TIMI-1)  flow. Extensive thrombus.   CONCLUSIONS:  1.  Two vessel coronary artery disease.  2.   Patent LIMA-LAD but significant disease in the native LAD.  3.   Severe diffuse disease in the SVG to RCA   CLINICAL COMMENTS:  The SVG is a very poor candidate for PCI. Moreover, the risk of  complication with PCI would be much higher than usual. The EKG  shows ST elevation and Q waves in the inferior leads. Therefore,  the CAD should be treated medically.   Assessment/Plan: 1.  Ventricular Tachycardia Appropriately treated with ATP. States he had the sensation of near syncope once or twice in Nov/Dec, but more starting this month.  Continue IV amiodarone Keep K > 4.0 and Mg > 2.0. Repeat BMET pending today.  Echo 08/2020 [facility]) LVEF 35-40%  2. CAD Severe native disease and per outside cath reports re-occluded at least 1 of his 2 grafts with medical therapy advised.  Most recent cath 07/2019 with known occluded SVG-PDA and patent LIMA-LAD No clear s/s ischemia, thought pt has not had angina at any time  3. Chronic systolic CHF EF 08/2020 LVEF 35-40% PTA meds include LOPRESSOR (Change to Toprol), spiro, and lisinopril (low dose, limited by hypotension per notes) I think poor candidate for Entresto given previous BP difficulties, would focus on optimizing BB for now.   Will discuss with MD. Possible cath tomorrow to re-assess LIMA-LAD. Continue IV amiodarone  For questions or updates, please contact [facility] Please consult [email or web address] for contact info under Cardiology/STEMI.  Signed, Graciella Freer, PA-C  01/06/2022 9:36 AM  Ventricular tachycardia-monomorphic-recurrent treated with ATP  Presyncope with the above  Coronary artery disease with prior bypass  Aborted cardiac arrest  CRT-D-[employer]  Systolic heart failure-chronic-systolic  Patient has had an escalation of ventricular  tachycardia over the recent 2-3 weeks.  This is been concurrent with worsening dyspnea peripheral edema and some orthopnea.  Not quite sure which is the chickened and the aide, but I suspect the heart failure is driving electromechanical instability.  However, he has had recurrent ventricular tachycardia and has also had on monitoring short runs of accelerated idioventricular rhythm suggesting that there may be an ischemic  component his recent exacerbation.  He has no symptoms to suggest a change in ischemic burden.  Troponins were negative.  We will increase his amiodarone, continuing IV overnight and then put him on 400 twice daily tomorrow.  We will allow him to follow-up with his primary cardiologist and EP team at [facility].  Diuresis tonighT-IV Lasix  He is hyperthyroid.  We will decrease his Synthroid from 100 to 88 mcg.  He is on metoprolol tartrate; apparently there was some issue with the metoprolol succinate and drug coverage.  We will start him on succinate.  He can either get it from the [facility] or from the [employer]  He is advised that [place] he is restricted from driving for 6 months

## 2022-01-06 NOTE — ED Notes (Signed)
Pt c/o nausea.  

## 2022-01-06 NOTE — ED Notes (Signed)
Pt having runs of Vtach followed by asymptomatic bradycardia. Page sent to Dr.Wrobel

## 2022-01-06 NOTE — ED Notes (Signed)
Called, messaged RN re Heart Monitor Alerts

## 2022-01-06 NOTE — ED Notes (Signed)
Pt awake, eating breakfast. New gown given to pt, wife at bedside

## 2022-01-06 NOTE — H&P (View-Only) (Signed)
ELECTROPHYSIOLOGY CONSULT NOTE    Patient ID: Bryan Miller MRN: [id number], DOB/AGE: 72-Aug-1952 72 y.o.  Admit date: 01/05/2022 Date of Consult: 01/06/2022  Primary Physician: Abigail Miyamoto, MD Primary Cardiologist: [facility] in [place] (Dr Hoy Finlay (interventional cardiology at [facility] heart and vascular) [phone]) Electrophysiologist: [facility] in [place] (? Dr. Christie Beckers)  Referring Provider: Dr. Joyce Gross  Patient Profile: Bryan Miller is a 71 y.o. male with hx of ICM s/p Bsci CRT-D, CAD s/p 2v CABG (LIMA-->LAD, SVG-->RCA (occluded)), CKD III, and prior VT who is being seen 01/06/2022 for the evaluation of ventricular tachycardia at the request of Dr. Joyce Gross  HPI:  Bryan Miller is a 71 y.o. male with medical history as above. Followed outside of our system.   Pt had CABGx 2 20 years ago.  Postoperatively had systolic CHF and has been on GDMT since, at times limited by BP. CRT placed in [place] approx 5 years ago for primary prevention, though also states has been on sotalol for VT suppression.  Switched to amiodarone several years ago by his best knowledge after VT/VF treated by device.   Seen in [place] approx 5 years ago with CP and vein graft had occluded. Per outside cath report there was extensive thrombus and the risks of intervening felt to outweigh the benefits.  Bryan Miller and his wife moved back to [place] about a year ago and he has been following at [facility] since.   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 Stable with ADLs most days over the past few months, but at times has to nap in the afternoon. Normally can take steps without undoe SOB.  Recent increase in fatigue, worse over past 2 weeks with episode of lightheadedness and near syncope.  Had multiple recurrent episodes, and yesterday had at least 3 back to back while teaching. No CP. SOB slightly worse over past several days.  With recurrent near syncope presented to  [facility] and found to have multiple episodes of VT requiring ATP. BiV% 95.   Pertinent labs on admission include COVID negative, TSH 0.222 (low), Free T4 1.84. BMET still pending. Mg 2.0, K 4.2, Cr 1.30. HS trop negative  Bolused on IV amiodarone and repeat echo ordered.  Currently feeling OK. Denies frank syncope. Reports history of VT and arrest in the past. Was initially on amiodarone, stopped for nausea and switched to sotalol, then switched BACK to amiodarone with therapy per patient. Denies chest pain, but did not have any chest pain prior to his CABG. Recent cath 2020 with patent LIMA-LAD  Past Medical History:  Diagnosis Date   Atherosclerotic heart disease of native coronary artery without angina pectoris 06/01/2011   Formatting of this note might be different from the original. 07/18/12 CT ABD AND PELVIS:  coronary artery calcifications   Dilated aortic root (HCC) 12/05/2018   Hyperlipidemia 08/15/2020   Left bundle branch block 09/14/2019   Formatting of this note might be different from the original. 2011:echo -  EF 40%, 4 chamber dilation, diffuse hypokinesis, worse inferiorly; TX carvedilol, simvastatin 40 mg and aspirin 2020: Echo - mild dilated LV, EF 35-40%, Basal-mid inferolateral, basal inferior/inferoseptal wall hypokinesis; normal RV   Old myocardial infarction 06/26/2011   Formatting of this note might be different from the original. 2004.   Prediabetes 05/05/2010   Presence of cardiac defibrillator 10/19/2017   Formatting of this note might be different from the original. PRE-OPERATIVE RECOMMENDATION    Pt is not  dependent on device   ICD: PROCEDURES if procedure is in the neck to hip region and using cautery    -Magnet applied over an ICD will disable shocking therapies only  -If electrocautery required, place grounding pad below the umbilicus, use bipolar cautery and limit use to short irregular bursts   Subclinical hypothyroidism 04/24/2019   Ventricular tachycardia, monomorphic  09/14/2019     Surgical History:  Past Surgical History:  Procedure Laterality Date   ABDOMINAL HERNIA REPAIR  2020   bypass  2001   CHOLECYSTECTOMY  1996   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     (Not in a hospital admission)   Inpatient Medications:   aspirin EC  81 mg Oral Daily   atorvastatin  40 mg Oral Daily   clopidogrel  75 mg Oral Daily   enoxaparin (LOVENOX) injection  40 mg Subcutaneous Q24H   levothyroxine  100 mcg Oral Q0600   [START ON 01/07/2022] metoprolol succinate  25 mg Oral Daily   sodium chloride flush  3 mL Intravenous Q12H   spironolactone  25 mg Oral Daily    Allergies: No Known Allergies  Social History   Socioeconomic History   Marital status: Married    Spouse name: Not on file   Number of children: 3   Years of education: Not on file   Highest education level: Not on file  Occupational History   Not on file  Tobacco Use   Smoking status: Former    Types: Cigarettes    Quit date: 1970    Years since quitting: 53.0   Smokeless tobacco: Never  Substance and Sexual Activity   Alcohol use: Not Currently   Drug use: Never   Sexual activity: Yes    Partners: Female  Other Topics Concern   Not on file  Social History Narrative   Not on file   Social Determinants of Health   Financial Resource Strain: Not on file  Food Insecurity: Not on file  Transportation Needs: Not on file  Physical Activity: Not on file  Stress: Not on file  Social Connections: Not on file  Intimate Partner Violence: Not on file     Family History  Problem Relation Age of Onset   Dementia Mother    Heart Problems Mother    Heart Problems Father      Review of Systems: All other systems reviewed and are otherwise negative except as noted above.  Physical Exam: Vitals:   01/06/22 0745 01/06/22 0815 01/06/22 0830 01/06/22 0845  BP: 108/74 101/75 106/69 105/66  Pulse: (!) 59 62 62 62  Resp: 14 18 18 17   Temp:      TempSrc:      SpO2: 99% 98%  100% 100%  Weight:      Height:        GEN- The patient is well appearing, alert and oriented x 3 today.   HEENT: normocephalic, atraumatic; sclera clear, conjunctiva pink; hearing intact; oropharynx clear; neck supple Lungs- Clear to ausculation bilaterally, normal work of breathing.  No wheezes, rales, rhonchi Heart- Regular rate and rhythm, no murmurs, rubs or gallops GI- soft, non-tender, non-distended, bowel sounds present Extremities- no clubbing, cyanosis, or edema; DP/PT/radial pulses 2+ bilaterally MS- no significant deformity or atrophy Skin- warm and dry, no rash or lesion Psych- euthymic mood, full affect Neuro- strength and sensation are intact  Labs:   Lab Results  Component Value Date   WBC 8.2 01/05/2022   HGB 14.0 01/05/2022  HCT 43.9 01/05/2022   MCV 99.5 01/05/2022   PLT 235 01/05/2022    Recent Labs  Lab 01/05/22 0957  NA 140  K 4.2  CL 106  CO2 26  BUN 9  CREATININE 1.30*  CALCIUM 9.2  GLUCOSE 94    Radiology/Studies: DG Chest 2 View  Result Date: 01/05/2022 CLINICAL DATA:  Shortness of breath EXAM: CHEST - 2 VIEW COMPARISON:  12/15/2021 FINDINGS: Left AICD remains in place, unchanged. Prior CABG. Heart is normal size. Lungs clear. No effusions or acute bony abnormality. IMPRESSION: No active cardiopulmonary disease. Electronically Signed   By: Charlett Nose M.D.   On: 01/05/2022 20:30    EKG: on arrival shows NSR with NSVT and PVC at 76 bpm (personally reviewed)  TELEMETRY: NSR/Biv Paced mostly, is having WCT as slow as 100-110 range (personally reviewed)  DEVICE HISTORY: BSx CRT-D implanted 2018 in [place]  Cath Report 08/2016 ([place]) CORONARY ANGIOGRAPHY FINDINGS:  Dominance: right  Left Main Artery: mild disease  Left Anterior Descending Artery: 100% middle third  Diagonal 1: 50%  Left Circumflex Artery: 40% middle third  Right Coronary Artery: 100%  Grafts:  LIMA-LAD: patent. Moderate diffuse disease in the native LAD.  Severe distal  native disease.  SVG-RCA: severe diffuse disease in the graft with poor (TIMI-1)  flow. Extensive thrombus.   CONCLUSIONS:  1.  Two vessel coronary artery disease.  2.   Patent LIMA-LAD but significant disease in the native LAD.  3.   Severe diffuse disease in the SVG to RCA   CLINICAL COMMENTS:  The SVG is a very poor candidate for PCI. Moreover, the risk of  complication with PCI would be much higher than usual. The EKG  shows ST elevation and Q waves in the inferior leads. Therefore,  the CAD should be treated medically.   Assessment/Plan: 1.  Ventricular Tachycardia Appropriately treated with ATP. States he had the sensation of near syncope once or twice in Nov/Dec, but more starting this month.  Continue IV amiodarone Keep K > 4.0 and Mg > 2.0. Repeat BMET pending today.  Echo 08/2020 [facility]) LVEF 35-40%  2. CAD Severe native disease and per outside cath reports re-occluded at least 1 of his 2 grafts with medical therapy advised.  Most recent cath 07/2019 with known occluded SVG-PDA and patent LIMA-LAD No clear s/s ischemia, thought pt has not had angina at any time  3. Chronic systolic CHF EF 08/2020 LVEF 35-40% PTA meds include LOPRESSOR (Change to Toprol), spiro, and lisinopril (low dose, limited by hypotension per notes) I think poor candidate for Entresto given previous BP difficulties, would focus on optimizing BB for now.   Will discuss with MD. Possible cath tomorrow to re-assess LIMA-LAD. Continue IV amiodarone  For questions or updates, please contact [facility] Please consult [email or web address] for contact info under Cardiology/STEMI.  Signed, Graciella Freer, PA-C  01/06/2022 9:36 AM  Ventricular tachycardia-monomorphic-recurrent treated with ATP  Presyncope with the above  Coronary artery disease with prior bypass  Aborted cardiac arrest  CRT-D-[employer]  Systolic heart failure-chronic-systolic  Patient has had an escalation of ventricular  tachycardia over the recent 2-3 weeks.  This is been concurrent with worsening dyspnea peripheral edema and some orthopnea.  Not quite sure which is the chickened and the aide, but I suspect the heart failure is driving electromechanical instability.  However, he has had recurrent ventricular tachycardia and has also had on monitoring short runs of accelerated idioventricular rhythm suggesting that there may be an ischemic  component his recent exacerbation.  He has no symptoms to suggest a change in ischemic burden.  Troponins were negative.  We will increase his amiodarone, continuing IV overnight and then put him on 400 twice daily tomorrow.  We will allow him to follow-up with his primary cardiologist and EP team at [facility].  Diuresis tonighT-IV Lasix  He is hyperthyroid.  We will decrease his Synthroid from 100 to 88 mcg.  He is on metoprolol tartrate; apparently there was some issue with the metoprolol succinate and drug coverage.  We will start him on succinate.  He can either get it from the [facility] or from the [employer]  He is advised that [place] he is restricted from driving for 6 months

## 2022-01-06 NOTE — H&P (Signed)
Cardiology Admission History and Physical:   Patient ID: Bryan Miller MRN: [id number]; DOB: [birth year]   Admission date: 01/05/2022  PCP:  Abigail Miyamoto, MD   [facility] Providers Cardiologist:  None   {  Chief Complaint:  "I kept feeling like I was going to pass out"  Patient Profile:   Bryan Miller is a 71 y.o. male with hx of ICM s/p Bsci CRT-D, CAD s/p 2v CABG (LIMA-->LAD, SVG-->RCA (occluded)), CKD III, and prior VT who is being seen 01/06/2022 for the evaluation of ventricular tachycardia.  History of Present Illness:   Mr. Bryan Miller has an extensive cardiac history.  He underwent two-vessel CABG 20 years ago at the age of 42.  Postoperatively he subsequently developed LV dysfunction and has been maintained on guideline directed medical therapy for many years.  Per outside notes titration has been limited by blood pressure.  He had a primary prevention CRT device placed and did receive appropriate therapy about 5 years ago while in [place].  For a period he was maintained on sotalol for ventricular arrhythmia suppression most recently has been on amiodarone.  About 5 years ago he presented acutely with chest pain while in [place] and his vein graft was acutely occluded.  Per outside cath report there was extensive thrombus and the risks of intervening felt to outweigh the benefits.  Mr. Dethlefsen and his wife moved back to [place] about a year ago and he has been following at [facility] since.  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  Over the past couple months he feels as though most days he is able to do most activities.  There are days where he is a bit more tired has to take a nap in the afternoon.  He has some steps at home which he normally is able to walk up and down without any significant shortness of breath.  Recently, however, he does note increasing fatigue.  About 2 weeks ago he first noticed an episode where he  felt quite dizzy and thought he was going to pass out.  He has had multiple similar episodes in the interim and had at least 3 while he was in the classroom earlier today.  He denies any associated chest pain.  Shortness of breath is maybe slightly worse over the last couple of days but with his current diuretic regimen he feels his volume has been relatively well-maintained.  Given the recurring episodes of near syncope he decided come to the emergency department for evaluation.  On arrival to the emergency department here he was hemodynamically stable.  His high-sensitivity troponin was flat with an elevated BNP.  I interrogated his device and he has had numerous episodes of sustained ventricular tachycardia that have all successfully been terminated with antitachycardia pacing.  He had 3 episodes earlier today at that peer to correlate with his symptoms.  He is 97% biventricular paced.  Past Medical History:  Diagnosis Date   Atherosclerotic heart disease of native coronary artery without angina pectoris 06/01/2011   Formatting of this note might be different from the original. 07/18/12 CT ABD AND PELVIS:  coronary artery calcifications   Dilated aortic root (HCC) 12/05/2018   Hyperlipidemia 08/15/2020   Left bundle branch block 09/14/2019   Formatting of this note might be different from the original. 2011:echo -  EF 40%, 4 chamber dilation, diffuse hypokinesis, worse inferiorly; TX carvedilol, simvastatin 40 mg and aspirin 2020: Echo - mild  dilated LV, EF 35-40%, Basal-mid inferolateral, basal inferior/inferoseptal wall hypokinesis; normal RV   Old myocardial infarction 06/26/2011   Formatting of this note might be different from the original. 2004.   Prediabetes 05/05/2010   Presence of cardiac defibrillator 10/19/2017   Formatting of this note might be different from the original. 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 Authorizing Provider  acetaminophen (TYLENOL) 650 MG CR tablet Take 650 mg by mouth every 8 (eight) hours as needed for pain.   Yes [provider]  amiodarone (PACERONE) 200 MG tablet Take 200 mg by mouth daily. 08/29/20 01/06/22 Yes [provider]  aspirin 81 MG EC tablet Take 1 tablet by mouth daily. 03/13/20  Yes [provider]  atorvastatin (LIPITOR) 40 MG tablet Take 1 tablet (40 mg total) by mouth daily. Patient taking differently: Take 40 mg by mouth at bedtime. 08/04/21  Yes Abigail Miyamoto, MD  Chlorphen-Phenyleph-ASA (ALKA-SELTZER PLUS COLD PO) Take 1 tablet by mouth 2 (two) times daily as needed (cold symptoms).   Yes [provider]  clopidogrel (PLAVIX) 75 MG tablet Take 1 tablet (75 mg total) by mouth daily. 08/04/21  Yes Abigail Miyamoto, MD  furosemide (LASIX) 20 MG tablet Take 20 mg by mouth daily with lunch. 08/28/20  Yes [provider]  levothyroxine (SYNTHROID) 75 MCG tablet Take 75 mcg by mouth daily. 12/27/21  Yes [provider]  lisinopril (ZESTRIL) 2.5 MG tablet Take 2.5 mg by mouth daily. 03/09/21  Yes [provider]  loratadine (CLARITIN) 10 MG tablet Take 10 mg by mouth daily as needed for allergies.   Yes [provider]  metoprolol tartrate (LOPRESSOR) 25 MG tablet Take 25 mg by mouth in the morning and at  bedtime. 02/20/21 01/06/22 Yes [provider]  nitroGLYCERIN (NITROSTAT) 0.4 MG SL tablet Place 0.4 mg under the tongue every 5 (five) minutes as needed for chest pain. 02/27/21  Yes [provider]  OVER THE COUNTER MEDICATION Place 1 spray into both nostrils as needed (nasal congestion). Xytiol   Yes [provider]  spironolactone (ALDACTONE) 25 MG tablet Take 0.5 tablets by mouth daily. 03/03/21  Yes [provider]  amoxicillin-clavulanate (AUGMENTIN) 875-125 MG tablet Take 1 tablet by mouth 2 (two) times daily. Patient not taking: Reported on 01/06/2022 12/15/21   Abigail Miyamoto, MD  guaiFENesin-codeine [facility]) 100-10 MG/5ML syrup Take 5 mLs by mouth 3 (three) times daily as needed. Patient not taking: Reported on 01/06/2022 12/15/21   Abigail Miyamoto, MD  levothyroxine (SYNTHROID) 100 MCG tablet Take 1 tablet (100 mcg total) by mouth daily. Recheck in 6 weeks Patient not taking: Reported on 01/06/2022 10/27/21   Abigail Miyamoto, MD  predniSONE (STERAPRED UNI-PAK 21 TAB) 10 MG (21) TBPK tablet Take 6ills  first day , then 5 pills day 2 and then cut down one pill day until gone Patient not taking: Reported on 01/06/2022 12/15/21   Abigail Miyamoto, MD     Allergies:   No Known Allergies  Social History:   Social History   Socioeconomic History   Marital status: Married    Spouse name: Not on file   Number of children: 3   Years of education: Not on file   Highest education level: Not on file  Occupational History   Not on file  Tobacco Use   Smoking status: Former    Types: Cigarettes    Quit date: 1970    Years since quitting: 53.0   Smokeless tobacco: Never  Substance and Sexual Activity   Alcohol use: Not Currently   Drug use: Never   Sexual activity: Yes    Partners: Female  Other Topics Concern   Not on file  Social History Narrative   Not on file   Social Determinants of Health   Financial Resource  Strain: Not on file  Food Insecurity: Not on file  Transportation Needs: Not on file  Physical Activity: Not on file  Stress: Not on file  Social Connections: Not on file  Intimate Partner Violence: Not on file    Family History:   The patient's family history includes Dementia in his mother; Heart Problems in his father and mother.    ROS:  Please see the history of present illness.  All other ROS reviewed and negative.     Physical Exam/Data:   Vitals:   01/06/22 0053 01/06/22 0100 01/06/22 0115 01/06/22 0130  BP:  107/67 100/65 103/65  Pulse:  62 (!) 59 (!) 59  Resp:  (!) 21 14 18   Temp:      TempSrc:      SpO2:  100% 100% 100%  Weight: 120 kg     Height: 6\' 2"  (1.88 m)      No intake or output data in the 24 hours ending 01/06/22 0306 Last 3 Weights 01/06/2022 12/22/2021 12/15/2021  Weight (lbs) 264 lb 8.8 oz 272 lb 272 lb  Weight (kg) 120 kg 123.378 kg 123.378 kg     Body mass index is 33.97 kg/m.  General:  Well nourished, well developed, in no acute distress HEENT: normal Neck: Neck veins a few cm above the clavicle at ~45 degrees Vascular: No carotid bruits; Distal pulses 2+ bilaterally   Cardiac:  normal S1, S2; RRR; no murmur  Lungs:  clear to auscultation bilaterally, no wheezing, rhonchi or rales  Abd: soft, nontender, no hepatomegaly  Ext: trace edema Musculoskeletal:  No deformities, BUE and BLE strength normal and equal Skin: warm and dry  Neuro:  CNs 2-12 intact, no focal abnormalities noted Psych:  Normal affect   EKG:  The ECG that was done 01/05/22 was personally reviewed and demonstrates sinus with BiV pacing + ventricular ectopy  Relevant CV Studies: Outside TTE (08/2020):   1. Technically difficult study.    2. Echo contrast utilized to enhance endocardial border definition.    3. The left ventricular systolic function is moderately decreased, LVEF is  visually estimated at 35-40%.    4. The left ventricle is moderately dilated in size with  decreased wall  thickness.    5. Global hypokinesis with akinesis of posterior and inferior walls.    6. The left atrium is moderately dilated in size.    7. The right ventricle is normal in size,  with normal systolic function.    8. Mild mitral and tricuspid regurgitation.   Outside LHC (07/2019): Dominance: right   Left Main Artery: < 20%, mild luminal irregularities distally   Left Anterior Descending Artery:   Prox:  30%   Mid:  100%, fills via LIMA   Distal: Distal 70% at the apex (small vessel)   Diagonal 1: 30%   Left Circumflex Artery:   Prox:  20%   Mid:  Normal   Distal: Normal   Obtuse Marginal 1: 30%   Right Coronary Artery:   Prox:  20%   Mid:  90%, then 100%   Distal: Not seen   R-PDA / R-PLVs: Fills via left to right collaterals   LIMA-LAD: Widely Patent  SVG-RPDA: Occluded proximally occluded  Laboratory Data:  High Sensitivity Troponin:   Recent Labs  Lab 01/05/22 1620 01/05/22 1820  TROPONINIHS 4 5      Chemistry Recent Labs  Lab 01/05/22 0957  NA 140  K 4.2  CL 106  CO2 26  GLUCOSE 94  BUN 9  CREATININE 1.30*  CALCIUM 9.2  GFRNONAA 59*  ANIONGAP 8    No results for input(s): PROT, ALBUMIN, AST, ALT, ALKPHOS, BILITOT in the last 168 hours. Lipids No results for input(s): CHOL, TRIG, HDL, LABVLDL, LDLCALC, CHOLHDL in the last 168 hours. Hematology Recent Labs  Lab 01/05/22 0957  WBC 8.2  RBC 4.41  HGB 14.0  HCT 43.9  MCV 99.5  MCH 31.7  MCHC 31.9  RDW 14.0  PLT 235   Thyroid No results for input(s): TSH, FREET4 in the last 168 hours. BNP Recent Labs  Lab 01/05/22 2114  BNP 217.4*    DDimer No results for input(s): DDIMER in the last 168 hours.   Radiology/Studies:  DG Chest 2 View  Result Date: 01/05/2022 CLINICAL DATA:  Shortness of breath EXAM: CHEST - 2 VIEW COMPARISON:  12/15/2021 FINDINGS: Left AICD remains in place, unchanged. Prior CABG. Heart is normal size. Lungs clear. No effusions or acute bony  abnormality. IMPRESSION: No active cardiopulmonary disease. Electronically Signed   By: Charlett Nose M.D.   On: 01/05/2022 20:30     Assessment and Plan:   #Ventricular Tachycardia: - Numerous episodes of VT since late December, all successfully treated with ATP. Had 3 this afternoon that all correlated with his symptoms. All occurred while on amiodarone.  - He does not appear grossly decompensated from a HF standpoint. I do worry that this is a manifestation of advanced HF, and certainly possible he is relatively low output. No recent ischemic symptoms.  - Reload with amio (but has already broken through on amio) - HF therapy as below  - Repeat TTE in AM. Can consider cMRI to assess scar burden if VT ablation potentially an option  - Consider invasive hemodynamics if recurrent arrhythmias   #Chronic Systolic and Diastolic HF: - Advanced ICM. R sided filling pressures do not seem terribly elevated on exam. Concerned above manifestation of more advanced disease  - Cont aldactone + metoprolol  - Can consider entresto for antiarrhythmic benefit (holding lisinopril)  - Consider addition of SGLT2i - Repeat echo as above   #CAD: - Continue ASA/plavix (unclear why still on DAPT) - Cont lipitor - Reassessing coronaries certainly an option but by report not sure what would be amenable to PCI absent significant LIMA disease   Risk Assessment/Risk Scores:   New York Heart Association (NYHA) Functional Class NYHA Class III   Severity of Illness: The  appropriate patient status for this patient is INPATIENT. Inpatient status is judged to be reasonable and necessary in order to provide the required intensity of service to ensure the patient's safety. The patient's presenting symptoms, physical exam findings, and initial radiographic and laboratory data in the context of their chronic comorbidities is felt to place them at high risk for further clinical deterioration. Furthermore, it is not  anticipated that the patient will be medically stable for discharge from the hospital within 2 midnights of admission.   * I certify that at the point of admission it is my clinical judgment that the patient will require inpatient hospital care spanning beyond 2 midnights from the point of admission due to high intensity of service, high risk for further deterioration and high frequency of surveillance required.*   For questions or updates, please contact [facility] Please consult [email or web address] for contact info under     Signed, Livingston Diones, MD  01/06/2022 3:06 AM

## 2022-01-06 NOTE — ED Notes (Signed)
Breakfast Ordered 

## 2022-01-06 NOTE — ED Notes (Signed)
Pt wanted his care team to have the phone number to his cardiologist in [place], Dr Hoy Finlay (interventional cardiology at [facility]) [phone]

## 2022-01-06 NOTE — Progress Notes (Signed)
° °  Echocardiogram 2D Echocardiogram has been performed.  [name] 01/06/2022, 10:39 AM

## 2022-01-06 NOTE — ED Notes (Signed)
Echo at bedside

## 2022-01-07 ENCOUNTER — Encounter (HOSPITAL_COMMUNITY)
Admission: EM | Disposition: A | Discharge status: Home / Self Care | Payer: Self-pay | Source: Home / Self Care | Attending: Internal Medicine

## 2022-01-07 DIAGNOSIS — I2510 Atherosclerotic heart disease of native coronary artery without angina pectoris: Secondary | ICD-10-CM

## 2022-01-07 DIAGNOSIS — I255 Ischemic cardiomyopathy: Secondary | ICD-10-CM | POA: Diagnosis not present

## 2022-01-07 DIAGNOSIS — R55 Syncope and collapse: Secondary | ICD-10-CM

## 2022-01-07 DIAGNOSIS — I472 Ventricular tachycardia, unspecified: Secondary | ICD-10-CM | POA: Diagnosis not present

## 2022-01-07 HISTORY — PX: LEFT HEART CATH AND CORS/GRAFTS ANGIOGRAPHY: CATH118250

## 2022-01-07 LAB — BASIC METABOLIC PANEL
Anion gap: 9 (ref 5–15)
BUN: 12 mg/dL (ref 8–23)
CO2: 27 mmol/L (ref 22–32)
Calcium: 8.8 mg/dL — ABNORMAL LOW (ref 8.9–10.3)
Chloride: 103 mmol/L (ref 98–111)
Creatinine, Ser: 1.15 mg/dL (ref 0.61–1.24)
GFR, Estimated: >60 mL/min (ref >60)
Glucose, Bld: 96 mg/dL (ref 70–99)
Potassium: 4.2 mmol/L (ref 3.5–5.1)
Sodium: 139 mmol/L (ref 135–145)

## 2022-01-07 LAB — MAGNESIUM: Magnesium: 1.9 mg/dL (ref 1.7–2.4)

## 2022-01-07 SURGERY — LEFT HEART CATH AND CORS/GRAFTS ANGIOGRAPHY
Anesthesia: LOCAL

## 2022-01-07 MED ORDER — IOHEXOL 350 MG/ML SOLN
INTRAVENOUS | Status: DC | PRN
  Administered 2022-01-07: 80 mL

## 2022-01-07 MED ORDER — SPIRONOLACTONE 12.5 MG HALF TABLET
12.5000 mg | ORAL_TABLET | Freq: Every day | ORAL | Status: DC
Start: 2022-01-07 — End: 2022-01-10
  Administered 2022-01-07 – 2022-01-10 (×4): 12.5 mg via ORAL
  Filled 2022-01-07 (×4): qty 1

## 2022-01-07 MED ORDER — SODIUM CHLORIDE 0.9% FLUSH
3.0000 mL | Freq: Two times a day (BID) | INTRAVENOUS | Status: DC
  Administered 2022-01-07 – 2022-01-09 (×3): 3 mL via INTRAVENOUS

## 2022-01-07 MED ORDER — SODIUM CHLORIDE 0.9 % IV SOLN: INTRAVENOUS | Status: DC

## 2022-01-07 MED ORDER — FENTANYL CITRATE (PF) 100 MCG/2ML IJ SOLN
INTRAMUSCULAR | Status: AC
  Filled 2022-01-07: qty 2

## 2022-01-07 MED ORDER — HEPARIN (PORCINE) IN NACL 1000-0.9 UT/500ML-% IV SOLN
INTRAVENOUS | Status: DC | PRN
  Administered 2022-01-07 (×2): 500 mL

## 2022-01-07 MED ORDER — HEPARIN SODIUM (PORCINE) 1000 UNIT/ML IJ SOLN
INTRAMUSCULAR | Status: AC
  Filled 2022-01-07: qty 10

## 2022-01-07 MED ORDER — SODIUM CHLORIDE 0.9 % IV SOLN: 250.0000 mL | INTRAVENOUS | Status: DC | PRN

## 2022-01-07 MED ORDER — MAGNESIUM SULFATE 2 GM/50ML IV SOLN
2.0000 g | Freq: Once | INTRAVENOUS | Status: AC
  Administered 2022-01-07: 2 g via INTRAVENOUS
  Filled 2022-01-07: qty 50

## 2022-01-07 MED ORDER — SODIUM CHLORIDE 0.9 % IV BOLUS
250.0000 mL | Freq: Once | INTRAVENOUS | Status: AC
  Administered 2022-01-07: 250 mL via INTRAVENOUS

## 2022-01-07 MED ORDER — ACETAMINOPHEN 325 MG PO TABS
650.0000 mg | ORAL_TABLET | ORAL | Status: DC | PRN
  Administered 2022-01-07 – 2022-01-08 (×2): 650 mg via ORAL
  Filled 2022-01-07: qty 2

## 2022-01-07 MED ORDER — LIDOCAINE HCL (PF) 1 % IJ SOLN
INTRAMUSCULAR | Status: DC | PRN
  Administered 2022-01-07: 3 mL
  Administered 2022-01-07: 2 mL

## 2022-01-07 MED ORDER — MIDAZOLAM HCL 2 MG/2ML IJ SOLN
INTRAMUSCULAR | Status: AC
  Filled 2022-01-07: qty 2

## 2022-01-07 MED ORDER — AMIODARONE HCL 200 MG PO TABS
400.0000 mg | ORAL_TABLET | Freq: Two times a day (BID) | ORAL | Status: DC
  Administered 2022-01-07 – 2022-01-10 (×7): 400 mg via ORAL
  Filled 2022-01-07 (×7): qty 2

## 2022-01-07 MED ORDER — HEPARIN (PORCINE) IN NACL 1000-0.9 UT/500ML-% IV SOLN
INTRAVENOUS | Status: AC
  Filled 2022-01-07: qty 1000

## 2022-01-07 MED ORDER — ONDANSETRON HCL 4 MG/2ML IJ SOLN: 4.0000 mg | Freq: Four times a day (QID) | INTRAMUSCULAR | Status: DC | PRN

## 2022-01-07 MED ORDER — FENTANYL CITRATE (PF) 100 MCG/2ML IJ SOLN
INTRAMUSCULAR | Status: DC | PRN
  Administered 2022-01-07: 25 ug via INTRAVENOUS

## 2022-01-07 MED ORDER — SODIUM CHLORIDE 0.9 % IV SOLN
INTRAVENOUS | Status: AC | PRN
  Administered 2022-01-07: 10 mL/h via INTRAVENOUS

## 2022-01-07 MED ORDER — LIDOCAINE HCL (PF) 1 % IJ SOLN
INTRAMUSCULAR | Status: AC
  Filled 2022-01-07: qty 30

## 2022-01-07 MED ORDER — SODIUM CHLORIDE 0.9% FLUSH: 3.0000 mL | INTRAVENOUS | Status: DC | PRN

## 2022-01-07 MED ORDER — HEPARIN SODIUM (PORCINE) 1000 UNIT/ML IJ SOLN
INTRAMUSCULAR | Status: DC | PRN
  Administered 2022-01-07: 6000 [IU] via INTRAVENOUS

## 2022-01-07 MED ORDER — SODIUM CHLORIDE 0.9 % IV SOLN: INTRAVENOUS | Status: AC

## 2022-01-07 MED ORDER — ENOXAPARIN SODIUM 30 MG/0.3ML IJ SOSY
30.0000 mg | PREFILLED_SYRINGE | INTRAMUSCULAR | Status: DC
  Filled 2022-01-07: qty 0.3

## 2022-01-07 MED ORDER — VERAPAMIL HCL 2.5 MG/ML IV SOLN
INTRAVENOUS | Status: AC
  Filled 2022-01-07: qty 2

## 2022-01-07 MED ORDER — MIDAZOLAM HCL 2 MG/2ML IJ SOLN
INTRAMUSCULAR | Status: DC | PRN
  Administered 2022-01-07: 1 mg via INTRAVENOUS

## 2022-01-07 MED ORDER — VERAPAMIL HCL 2.5 MG/ML IV SOLN
INTRAVENOUS | Status: DC | PRN
  Administered 2022-01-07: 10 mL via INTRA_ARTERIAL

## 2022-01-07 MED ORDER — LABETALOL HCL 5 MG/ML IV SOLN: 10.0000 mg | INTRAVENOUS | Status: AC | PRN

## 2022-01-07 MED ORDER — ISOSORBIDE MONONITRATE ER 30 MG PO TB24
30.0000 mg | ORAL_TABLET | Freq: Every day | ORAL | Status: DC
  Administered 2022-01-07 – 2022-01-10 (×4): 30 mg via ORAL
  Filled 2022-01-07 (×4): qty 1

## 2022-01-07 MED ORDER — HYDRALAZINE HCL 20 MG/ML IJ SOLN: 10.0000 mg | INTRAMUSCULAR | Status: AC | PRN

## 2022-01-07 MED ORDER — SODIUM CHLORIDE 0.9% FLUSH
3.0000 mL | Freq: Two times a day (BID) | INTRAVENOUS | Status: DC
  Administered 2022-01-08 – 2022-01-09 (×4): 3 mL via INTRAVENOUS

## 2022-01-07 SURGICAL SUPPLY — 10 items
CATH 5FR JL3.5 JR4 ANG PIG MP (CATHETERS) ×1 IMPLANT
DEVICE RAD TR BAND REGULAR (VASCULAR PRODUCTS) ×1 IMPLANT
GLIDESHEATH SLEND A-KIT 6F 22G (SHEATH) ×1 IMPLANT
GUIDEWIRE INQWIRE 1.5J.035X260 (WIRE) IMPLANT
INQWIRE 1.5J .035X260CM (WIRE) ×2
KIT HEART LEFT (KITS) ×2 IMPLANT
PACK CARDIAC CATHETERIZATION (CUSTOM PROCEDURE TRAY) ×2 IMPLANT
SHEATH PROBE COVER 6X72 (BAG) ×1 IMPLANT
TRANSDUCER W/STOPCOCK (MISCELLANEOUS) ×2 IMPLANT
TUBING CIL FLEX 10 FLL-RA (TUBING) ×2 IMPLANT

## 2022-01-07 NOTE — CV Procedure (Signed)
Occluded vein graft to the right coronary.  Mid occlusion of the right coronary native vessel. Total occlusion of the mid LAD and first diagonal. Widely patent circumflex artery.  Collaterals to the distal RCA are noted Widely patent left main. Normal LVEDP at 11 mmHg. Patent LIMA to the LAD with 95% stenosis in the mid LAD retrograde/proximal to the internal mammary insertion site leaving the mid LAD segment potentially ischemic.  This is not reachable and not a technically feasible site for PCI due to angulation.

## 2022-01-07 NOTE — Progress Notes (Addendum)
No results for input(s): CKTOTAL, CKMB, CKMBINDEX, TROPONINI in the last 72 hours.   Telemetry    NSR 60-70s with very frequent runs of AIVR at ~ 105 bpm (personally reviewed)  Radiology    DG Chest 2 View  Result Date: 01/05/2022 CLINICAL DATA:  Shortness of breath EXAM: CHEST - 2 VIEW COMPARISON:  12/15/2021 FINDINGS: Left AICD remains in place, unchanged. Prior CABG. Heart is normal size. Lungs clear. No effusions or acute bony abnormality. IMPRESSION: No active cardiopulmonary disease. Electronically Signed   By: Rolm Baptise M.D.   On: 01/05/2022 20:30   ECHOCARDIOGRAM COMPLETE  Result Date: 01/06/2022    ECHOCARDIOGRAM REPORT   Patient Name:   Bryan Miller Date of Exam: 01/06/2022 Medical Rec #:  [id number]         Height:       74.0 in Accession #:    [id number]        Weight:       264.6 lb Date of Birth:  01-31-1951           BSA:          2.448 m Patient Age:    71 years          BP:           108/73 mmHg Patient Gender: M                 HR:           58 bpm. Exam Location:  Inpatient Procedure: 2D Echo, Cardiac Doppler and Color Doppler Indications:    VENTRICULAR TACHYCARDIA  History:        Patient has no prior history of Echocardiogram examinations.                 CAD; Arrythmias:LBBB and Tachycardia. DILATED AOV ROOT.  Sonographer:    Beryle Beams Referring PhysLF:[id number] CHRISTOPHER A [place]  1. EF cannot be estimated due to poor acoustical windows and inability to visualize endocardial borders. Left ventricular endocardial border not optimally defined to evaluate regional wall motion. The left ventricular internal cavity size was moderately  dilated.  2. Right ventricular systolic function was not well visualized. The right ventricular size is normal. Tricuspid regurgitation signal is inadequate for assessing PA pressure.  3. Left atrial size was severely dilated.  4. The mitral valve is normal in structure. No evidence of mitral valve regurgitation. No evidence of mitral stenosis.  5. The aortic valve is normal in structure. Aortic valve regurgitation is not visualized. No aortic stenosis is present.  6. Aortic dilatation noted. There is mild dilatation of the aortic root, measuring 38 mm. Conclusion(s)/Recommendation(s): Recommend repeat limited study with definity contrast. FINDINGS  Left Ventricle: Left ventricular ejection fraction, by estimation, is EF cannot be estimated due to poor acoustical windows and inability to visualize endocardial walls%. The left ventricle has normal function. Left ventricular endocardial border not optimally defined to evaluate regional wall motion. The left ventricular internal cavity size was moderately dilated. There is no left ventricular hypertrophy. Left ventricular diastolic parameters are consistent with Grade II diastolic dysfunction (pseudonormalization). Normal left  ventricular filling pressure. Right Ventricle: IVC NOT SEEN. The right ventricular size is normal. No increase in right ventricular wall thickness. Right ventricular systolic function was not well visualized. Tricuspid regurgitation signal is inadequate for assessing PA pressure. Left Atrium: Left atrial size was severely dilated. Right Atrium: Right atrial size was normal in size. Pericardium: There is no evidence  No results for input(s): CKTOTAL, CKMB, CKMBINDEX, TROPONINI in the last 72 hours.   Telemetry    NSR 60-70s with very frequent runs of AIVR at ~ 105 bpm (personally reviewed)  Radiology    DG Chest 2 View  Result Date: 01/05/2022 CLINICAL DATA:  Shortness of breath EXAM: CHEST - 2 VIEW COMPARISON:  12/15/2021 FINDINGS: Left AICD remains in place, unchanged. Prior CABG. Heart is normal size. Lungs clear. No effusions or acute bony abnormality. IMPRESSION: No active cardiopulmonary disease. Electronically Signed   By: Rolm Baptise M.D.   On: 01/05/2022 20:30   ECHOCARDIOGRAM COMPLETE  Result Date: 01/06/2022    ECHOCARDIOGRAM REPORT   Patient Name:   Bryan Miller Date of Exam: 01/06/2022 Medical Rec #:  [id number]         Height:       74.0 in Accession #:    [id number]        Weight:       264.6 lb Date of Birth:  01-31-1951           BSA:          2.448 m Patient Age:    71 years          BP:           108/73 mmHg Patient Gender: M                 HR:           58 bpm. Exam Location:  Inpatient Procedure: 2D Echo, Cardiac Doppler and Color Doppler Indications:    VENTRICULAR TACHYCARDIA  History:        Patient has no prior history of Echocardiogram examinations.                 CAD; Arrythmias:LBBB and Tachycardia. DILATED AOV ROOT.  Sonographer:    Beryle Beams Referring PhysLF:[id number] CHRISTOPHER A [place]  1. EF cannot be estimated due to poor acoustical windows and inability to visualize endocardial borders. Left ventricular endocardial border not optimally defined to evaluate regional wall motion. The left ventricular internal cavity size was moderately  dilated.  2. Right ventricular systolic function was not well visualized. The right ventricular size is normal. Tricuspid regurgitation signal is inadequate for assessing PA pressure.  3. Left atrial size was severely dilated.  4. The mitral valve is normal in structure. No evidence of mitral valve regurgitation. No evidence of mitral stenosis.  5. The aortic valve is normal in structure. Aortic valve regurgitation is not visualized. No aortic stenosis is present.  6. Aortic dilatation noted. There is mild dilatation of the aortic root, measuring 38 mm. Conclusion(s)/Recommendation(s): Recommend repeat limited study with definity contrast. FINDINGS  Left Ventricle: Left ventricular ejection fraction, by estimation, is EF cannot be estimated due to poor acoustical windows and inability to visualize endocardial walls%. The left ventricle has normal function. Left ventricular endocardial border not optimally defined to evaluate regional wall motion. The left ventricular internal cavity size was moderately dilated. There is no left ventricular hypertrophy. Left ventricular diastolic parameters are consistent with Grade II diastolic dysfunction (pseudonormalization). Normal left  ventricular filling pressure. Right Ventricle: IVC NOT SEEN. The right ventricular size is normal. No increase in right ventricular wall thickness. Right ventricular systolic function was not well visualized. Tricuspid regurgitation signal is inadequate for assessing PA pressure. Left Atrium: Left atrial size was severely dilated. Right Atrium: Right atrial size was normal in size. Pericardium: There is no evidence  Root diam: 3.80 cm Ao Asc diam:  3.00 cm MITRAL VALVE               TRICUSPID VALVE MV Area (PHT): 2.46 cm    TV Peak grad:   53.3 mmHg MV Peak grad:  87.6 mmHg   TV Vmax:        3.65 m/s MV Vmax:       4.68 m/s MV Decel Time: 309 msec    SHUNTS MV E velocity: 53.60 cm/s  Systemic VTI:  0.10 m MV A velocity: 39.60 cm/s  Systemic Diam: 2.40 cm MV E/A ratio:  1.35 Fransico Him MD Electronically signed by Fransico Him MD Signature Date/Time: 01/06/2022/11:05:44 AM    Final    ECHOCARDIOGRAM LIMITED  Result Date: 01/06/2022    ECHOCARDIOGRAM LIMITED REPORT   Patient Name:   Bryan Miller Date of Exam: 01/06/2022 Medical Rec #:  [id number]         Height:       74.0 in Accession #:    [id number]        Weight:       264.6 lb Date of Birth:  1951-02-27          BSA:          2.448 m Patient Age:    71 years          BP:           107/67 mmHg Patient Gender: M                 HR:           60 bpm. Exam Location:  Inpatient Procedure: Limited Echo and Intracardiac Opacification Agent Indications:    I47.2 Ventricular tachycardia  History:        Patient has prior history of Echocardiogram examinations, most                 recent 01/06/2021.  Sonographer:    Beryle Beams Referring Phys: [id number] MICHAEL ANDREW TILLERY   Sonographer Comments: Limited with definity for LV EF IMPRESSIONS  1. Left ventricular ejection fraction, by estimation, is 40 to 45%. The left ventricle has mildly decreased function. The left ventricle demonstrates global hypokinesis. FINDINGS  Fransico Him MD Electronically signed by Fransico Him MD Signature Date/Time: 01/06/2022/2:28:24 PM    Final     Patient Profile     Bryan Miller is a 71 y.o. male with hx of ICM s/p Bsci CRT-D, CAD s/p 2v CABG (LIMA-->LAD, SVG-->RCA (occluded)), CKD III, and prior VT who is being seen 01/06/2022 for the evaluation of ventricular tachycardia at the request of Dr. 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 Transition to po amiodarone in setting of thyroid dysfunction  Pt continues to have very frequent runs of AIVR. Will plan for LHC today to r/u ischemic component. Consider addition of Ranolazine +/- nitrates pending results.   Continue synthroid 88 mcg for now. Will need close PCP follow up.   Have reviewed [place] driving restrictions  For questions or updates, please contact [facility] Please consult [email or web address] for contact info under Cardiology/STEMI.  Signed, Shirley Friar, PA-C  01/07/2022, 7:58 AM    (As above)  Continues with runs of slow VT with first beat of  Root diam: 3.80 cm Ao Asc diam:  3.00 cm MITRAL VALVE               TRICUSPID VALVE MV Area (PHT): 2.46 cm    TV Peak grad:   53.3 mmHg MV Peak grad:  87.6 mmHg   TV Vmax:        3.65 m/s MV Vmax:       4.68 m/s MV Decel Time: 309 msec    SHUNTS MV E velocity: 53.60 cm/s  Systemic VTI:  0.10 m MV A velocity: 39.60 cm/s  Systemic Diam: 2.40 cm MV E/A ratio:  1.35 Fransico Him MD Electronically signed by Fransico Him MD Signature Date/Time: 01/06/2022/11:05:44 AM    Final    ECHOCARDIOGRAM LIMITED  Result Date: 01/06/2022    ECHOCARDIOGRAM LIMITED REPORT   Patient Name:   Bryan Miller Date of Exam: 01/06/2022 Medical Rec #:  [id number]         Height:       74.0 in Accession #:    [id number]        Weight:       264.6 lb Date of Birth:  1951-02-27          BSA:          2.448 m Patient Age:    71 years          BP:           107/67 mmHg Patient Gender: M                 HR:           60 bpm. Exam Location:  Inpatient Procedure: Limited Echo and Intracardiac Opacification Agent Indications:    I47.2 Ventricular tachycardia  History:        Patient has prior history of Echocardiogram examinations, most                 recent 01/06/2021.  Sonographer:    Beryle Beams Referring Phys: [id number] MICHAEL ANDREW TILLERY   Sonographer Comments: Limited with definity for LV EF IMPRESSIONS  1. Left ventricular ejection fraction, by estimation, is 40 to 45%. The left ventricle has mildly decreased function. The left ventricle demonstrates global hypokinesis. FINDINGS  Fransico Him MD Electronically signed by Fransico Him MD Signature Date/Time: 01/06/2022/2:28:24 PM    Final     Patient Profile     Bryan Miller is a 71 y.o. male with hx of ICM s/p Bsci CRT-D, CAD s/p 2v CABG (LIMA-->LAD, SVG-->RCA (occluded)), CKD III, and prior VT who is being seen 01/06/2022 for the evaluation of ventricular tachycardia at the request of Dr. 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 Transition to po amiodarone in setting of thyroid dysfunction  Pt continues to have very frequent runs of AIVR. Will plan for LHC today to r/u ischemic component. Consider addition of Ranolazine +/- nitrates pending results.   Continue synthroid 88 mcg for now. Will need close PCP follow up.   Have reviewed [place] driving restrictions  For questions or updates, please contact [facility] Please consult [email or web address] for contact info under Cardiology/STEMI.  Signed, Shirley Friar, PA-C  01/07/2022, 7:58 AM    (As above)  Continues with runs of slow VT with first beat of  Root diam: 3.80 cm Ao Asc diam:  3.00 cm MITRAL VALVE               TRICUSPID VALVE MV Area (PHT): 2.46 cm    TV Peak grad:   53.3 mmHg MV Peak grad:  87.6 mmHg   TV Vmax:        3.65 m/s MV Vmax:       4.68 m/s MV Decel Time: 309 msec    SHUNTS MV E velocity: 53.60 cm/s  Systemic VTI:  0.10 m MV A velocity: 39.60 cm/s  Systemic Diam: 2.40 cm MV E/A ratio:  1.35 Fransico Him MD Electronically signed by Fransico Him MD Signature Date/Time: 01/06/2022/11:05:44 AM    Final    ECHOCARDIOGRAM LIMITED  Result Date: 01/06/2022    ECHOCARDIOGRAM LIMITED REPORT   Patient Name:   Bryan Miller Date of Exam: 01/06/2022 Medical Rec #:  [id number]         Height:       74.0 in Accession #:    [id number]        Weight:       264.6 lb Date of Birth:  1951-02-27          BSA:          2.448 m Patient Age:    71 years          BP:           107/67 mmHg Patient Gender: M                 HR:           60 bpm. Exam Location:  Inpatient Procedure: Limited Echo and Intracardiac Opacification Agent Indications:    I47.2 Ventricular tachycardia  History:        Patient has prior history of Echocardiogram examinations, most                 recent 01/06/2021.  Sonographer:    Beryle Beams Referring Phys: [id number] MICHAEL ANDREW TILLERY   Sonographer Comments: Limited with definity for LV EF IMPRESSIONS  1. Left ventricular ejection fraction, by estimation, is 40 to 45%. The left ventricle has mildly decreased function. The left ventricle demonstrates global hypokinesis. FINDINGS  Fransico Him MD Electronically signed by Fransico Him MD Signature Date/Time: 01/06/2022/2:28:24 PM    Final     Patient Profile     Bryan Miller is a 71 y.o. male with hx of ICM s/p Bsci CRT-D, CAD s/p 2v CABG (LIMA-->LAD, SVG-->RCA (occluded)), CKD III, and prior VT who is being seen 01/06/2022 for the evaluation of ventricular tachycardia at the request of Dr. 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 Transition to po amiodarone in setting of thyroid dysfunction  Pt continues to have very frequent runs of AIVR. Will plan for LHC today to r/u ischemic component. Consider addition of Ranolazine +/- nitrates pending results.   Continue synthroid 88 mcg for now. Will need close PCP follow up.   Have reviewed [place] driving restrictions  For questions or updates, please contact [facility] Please consult [email or web address] for contact info under Cardiology/STEMI.  Signed, Shirley Friar, PA-C  01/07/2022, 7:58 AM    (As above)  Continues with runs of slow VT with first beat of

## 2022-01-07 NOTE — Interval H&P Note (Signed)
Cath Lab Visit (complete for each Cath Lab visit)  Clinical Evaluation Leading to the Procedure:   ACS: No.  Non-ACS:    Anginal Classification: CCS I  Anti-ischemic medical therapy: No Therapy  Non-Invasive Test Results: No non-invasive testing performed  Prior CABG: Previous CABG      History and Physical Interval Note:  01/07/2022 1:12 PM  Bryan Miller  has presented today for surgery, with the diagnosis of VT.  The various methods of treatment have been discussed with the patient and family. After consideration of risks, benefits and other options for treatment, the patient has consented to  Procedure(s): LEFT HEART CATH AND CORS/GRAFTS ANGIOGRAPHY (N/A) as a surgical intervention.  The patient's history has been reviewed, patient examined, no change in status, stable for surgery.  I have reviewed the patient's chart and labs.  Questions were answered to the patient's satisfaction.     Lyn Records III

## 2022-01-07 NOTE — Progress Notes (Signed)
Patient's BP= 85/57.  Spoke to Laverda Page, NP, order for 250 cc NS bolus received.   Post bolus BP=95/75

## 2022-01-08 ENCOUNTER — Encounter (HOSPITAL_COMMUNITY): Payer: Self-pay | Admitting: Interventional Cardiology

## 2022-01-08 ENCOUNTER — Other Ambulatory Visit: Payer: Self-pay

## 2022-01-08 DIAGNOSIS — I2510 Atherosclerotic heart disease of native coronary artery without angina pectoris: Secondary | ICD-10-CM | POA: Diagnosis not present

## 2022-01-08 LAB — MAGNESIUM: Magnesium: 2 mg/dL (ref 1.7–2.4)

## 2022-01-08 LAB — BASIC METABOLIC PANEL
Anion gap: 8 (ref 5–15)
BUN: 14 mg/dL (ref 8–23)
CO2: 24 mmol/L (ref 22–32)
Calcium: 8.7 mg/dL — ABNORMAL LOW (ref 8.9–10.3)
Chloride: 105 mmol/L (ref 98–111)
Creatinine, Ser: 1.13 mg/dL (ref 0.61–1.24)
GFR, Estimated: >60 mL/min (ref >60)
Glucose, Bld: 102 mg/dL — ABNORMAL HIGH (ref 70–99)
Potassium: 4.3 mmol/L (ref 3.5–5.1)
Sodium: 137 mmol/L (ref 135–145)

## 2022-01-08 MED ORDER — RANOLAZINE ER 500 MG PO TB12
500.0000 mg | ORAL_TABLET | Freq: Two times a day (BID) | ORAL | Status: DC
Start: 2022-01-08 — End: 2022-01-10
  Administered 2022-01-08 – 2022-01-10 (×5): 500 mg via ORAL
  Filled 2022-01-08 (×5): qty 1

## 2022-01-08 MED ORDER — METOPROLOL SUCCINATE ER 25 MG PO TB24
25.0000 mg | ORAL_TABLET | Freq: Every day | ORAL | Status: DC
  Administered 2022-01-09: 25 mg via ORAL
  Filled 2022-01-08: qty 1

## 2022-01-08 NOTE — Progress Notes (Signed)
Patient BP 91/63, HR 106.  Denies dizziness/CP.  PA [facility]illery notified and will change orders to give metoprolol at HS, okay to give imdur, ranexa and aldactone.

## 2022-01-08 NOTE — Progress Notes (Addendum)
Telemetry    NSR 60-70s / AIVR 100-105 with a nearly 50% or greater burden at times (personally reviewed)  Radiology    CARDIAC CATHETERIZATION  Result Date: 01/07/2022 CONCLUSIONS: Severe native vessel coronary disease with total occlusion of the proximal and mid RCA, mid LAD, and moderate diffuse disease in the first diagonal.  The dominant obtuse marginal branch contains 50% stenosis. Patent LIMA to mid/distal LAD is widely patent but with diffuse disease both antegrade and retrograde to graft insertion site. Total occlusion of saphenous vein graft to distal RCA. Normal LVEDP, 11 mmHg. RECOMMENDATIONS: The mid and distal LAD segments could be causing ischemia induced arrhythmia but are not approachable with PCI.  Medical  therapy/ablation recommended for recurrent VT.   ECHOCARDIOGRAM COMPLETE  Result Date: 01/06/2022    ECHOCARDIOGRAM REPORT   Patient Name:   Bryan Miller Date of Exam: 01/06/2022 Medical Rec #:  [id number]         Height:       74.0 in Accession #:    [id number]        Weight:       264.6 lb Date of Birth:  [birth year]          BSA:          2.448 m Patient Age:    71 years          BP:           108/73 mmHg Patient Gender: M                 HR:           58 bpm. Exam Location:  Inpatient Procedure: 2D Echo, Cardiac Doppler and Color Doppler Indications:    VENTRICULAR TACHYCARDIA  History:        Patient has no prior history of Echocardiogram examinations.                 CAD; Arrythmias:LBBB and Tachycardia. DILATED AOV ROOT.  Sonographer:    Beryle Beams Referring PhysLF:[id number] CHRISTOPHER A [place]  1. EF cannot be estimated due to poor acoustical windows and inability to visualize endocardial borders. Left ventricular endocardial border not optimally defined to evaluate regional wall motion. The left ventricular internal cavity size was moderately  dilated.  2. Right ventricular systolic function was not well visualized. The right ventricular size is normal. Tricuspid regurgitation signal is inadequate for assessing PA pressure.  3. Left atrial size was severely dilated.  4. The mitral valve is normal in structure. No evidence of mitral valve regurgitation. No evidence of mitral stenosis.  5. The aortic valve is normal in structure. Aortic valve regurgitation is not visualized. No aortic stenosis is present.  6. Aortic dilatation noted. There is mild dilatation of the aortic root, measuring 38 mm. Conclusion(s)/Recommendation(s): Recommend repeat limited study with definity contrast. FINDINGS  Left Ventricle: Left ventricular ejection fraction, by estimation, is EF cannot be estimated due to poor acoustical windows and inability to visualize endocardial walls%. The left ventricle has normal  function. Left ventricular endocardial border not optimally defined to evaluate regional wall motion. The left ventricular internal cavity size was moderately dilated. There is no left ventricular hypertrophy. Left ventricular diastolic parameters are consistent with Grade II diastolic dysfunction (pseudonormalization). Normal left ventricular filling pressure. Right Ventricle: IVC NOT SEEN. The right ventricular size is normal. No increase in right ventricular wall thickness. Right ventricular systolic function was not well visualized. Tricuspid regurgitation signal is inadequate for assessing  Telemetry    NSR 60-70s / AIVR 100-105 with a nearly 50% or greater burden at times (personally reviewed)  Radiology    CARDIAC CATHETERIZATION  Result Date: 01/07/2022 CONCLUSIONS: Severe native vessel coronary disease with total occlusion of the proximal and mid RCA, mid LAD, and moderate diffuse disease in the first diagonal.  The dominant obtuse marginal branch contains 50% stenosis. Patent LIMA to mid/distal LAD is widely patent but with diffuse disease both antegrade and retrograde to graft insertion site. Total occlusion of saphenous vein graft to distal RCA. Normal LVEDP, 11 mmHg. RECOMMENDATIONS: The mid and distal LAD segments could be causing ischemia induced arrhythmia but are not approachable with PCI.  Medical  therapy/ablation recommended for recurrent VT.   ECHOCARDIOGRAM COMPLETE  Result Date: 01/06/2022    ECHOCARDIOGRAM REPORT   Patient Name:   Bryan Miller Date of Exam: 01/06/2022 Medical Rec #:  [id number]         Height:       74.0 in Accession #:    [id number]        Weight:       264.6 lb Date of Birth:  [birth year]          BSA:          2.448 m Patient Age:    71 years          BP:           108/73 mmHg Patient Gender: M                 HR:           58 bpm. Exam Location:  Inpatient Procedure: 2D Echo, Cardiac Doppler and Color Doppler Indications:    VENTRICULAR TACHYCARDIA  History:        Patient has no prior history of Echocardiogram examinations.                 CAD; Arrythmias:LBBB and Tachycardia. DILATED AOV ROOT.  Sonographer:    Beryle Beams Referring PhysLF:[id number] CHRISTOPHER A [place]  1. EF cannot be estimated due to poor acoustical windows and inability to visualize endocardial borders. Left ventricular endocardial border not optimally defined to evaluate regional wall motion. The left ventricular internal cavity size was moderately  dilated.  2. Right ventricular systolic function was not well visualized. The right ventricular size is normal. Tricuspid regurgitation signal is inadequate for assessing PA pressure.  3. Left atrial size was severely dilated.  4. The mitral valve is normal in structure. No evidence of mitral valve regurgitation. No evidence of mitral stenosis.  5. The aortic valve is normal in structure. Aortic valve regurgitation is not visualized. No aortic stenosis is present.  6. Aortic dilatation noted. There is mild dilatation of the aortic root, measuring 38 mm. Conclusion(s)/Recommendation(s): Recommend repeat limited study with definity contrast. FINDINGS  Left Ventricle: Left ventricular ejection fraction, by estimation, is EF cannot be estimated due to poor acoustical windows and inability to visualize endocardial walls%. The left ventricle has normal  function. Left ventricular endocardial border not optimally defined to evaluate regional wall motion. The left ventricular internal cavity size was moderately dilated. There is no left ventricular hypertrophy. Left ventricular diastolic parameters are consistent with Grade II diastolic dysfunction (pseudonormalization). Normal left ventricular filling pressure. Right Ventricle: IVC NOT SEEN. The right ventricular size is normal. No increase in right ventricular wall thickness. Right ventricular systolic function was not well visualized. Tricuspid regurgitation signal is inadequate for assessing  Telemetry    NSR 60-70s / AIVR 100-105 with a nearly 50% or greater burden at times (personally reviewed)  Radiology    CARDIAC CATHETERIZATION  Result Date: 01/07/2022 CONCLUSIONS: Severe native vessel coronary disease with total occlusion of the proximal and mid RCA, mid LAD, and moderate diffuse disease in the first diagonal.  The dominant obtuse marginal branch contains 50% stenosis. Patent LIMA to mid/distal LAD is widely patent but with diffuse disease both antegrade and retrograde to graft insertion site. Total occlusion of saphenous vein graft to distal RCA. Normal LVEDP, 11 mmHg. RECOMMENDATIONS: The mid and distal LAD segments could be causing ischemia induced arrhythmia but are not approachable with PCI.  Medical  therapy/ablation recommended for recurrent VT.   ECHOCARDIOGRAM COMPLETE  Result Date: 01/06/2022    ECHOCARDIOGRAM REPORT   Patient Name:   Bryan Miller Date of Exam: 01/06/2022 Medical Rec #:  [id number]         Height:       74.0 in Accession #:    [id number]        Weight:       264.6 lb Date of Birth:  [birth year]          BSA:          2.448 m Patient Age:    71 years          BP:           108/73 mmHg Patient Gender: M                 HR:           58 bpm. Exam Location:  Inpatient Procedure: 2D Echo, Cardiac Doppler and Color Doppler Indications:    VENTRICULAR TACHYCARDIA  History:        Patient has no prior history of Echocardiogram examinations.                 CAD; Arrythmias:LBBB and Tachycardia. DILATED AOV ROOT.  Sonographer:    Beryle Beams Referring PhysLF:[id number] CHRISTOPHER A [place]  1. EF cannot be estimated due to poor acoustical windows and inability to visualize endocardial borders. Left ventricular endocardial border not optimally defined to evaluate regional wall motion. The left ventricular internal cavity size was moderately  dilated.  2. Right ventricular systolic function was not well visualized. The right ventricular size is normal. Tricuspid regurgitation signal is inadequate for assessing PA pressure.  3. Left atrial size was severely dilated.  4. The mitral valve is normal in structure. No evidence of mitral valve regurgitation. No evidence of mitral stenosis.  5. The aortic valve is normal in structure. Aortic valve regurgitation is not visualized. No aortic stenosis is present.  6. Aortic dilatation noted. There is mild dilatation of the aortic root, measuring 38 mm. Conclusion(s)/Recommendation(s): Recommend repeat limited study with definity contrast. FINDINGS  Left Ventricle: Left ventricular ejection fraction, by estimation, is EF cannot be estimated due to poor acoustical windows and inability to visualize endocardial walls%. The left ventricle has normal  function. Left ventricular endocardial border not optimally defined to evaluate regional wall motion. The left ventricular internal cavity size was moderately dilated. There is no left ventricular hypertrophy. Left ventricular diastolic parameters are consistent with Grade II diastolic dysfunction (pseudonormalization). Normal left ventricular filling pressure. Right Ventricle: IVC NOT SEEN. The right ventricular size is normal. No increase in right ventricular wall thickness. Right ventricular systolic function was not well visualized. Tricuspid regurgitation signal is inadequate for assessing  26.600 cm/s LVOT VTI:          0.098 m LVOT/AV VTI ratio: 0.52  AORTA Ao Root diam: 3.80 cm Ao Asc diam:  3.00 cm MITRAL VALVE               TRICUSPID VALVE MV Area (PHT): 2.46 cm    TV Peak grad:   53.3 mmHg MV Peak grad:  87.6 mmHg   TV Vmax:        3.65 m/s MV Vmax:       4.68 m/s MV Decel Time: 309 msec    SHUNTS MV E velocity: 53.60 cm/s  Systemic VTI:  0.10 m MV A velocity: 39.60 cm/s  Systemic Diam: 2.40 cm MV E/A ratio:  1.35 Armanda Magic MD Electronically signed by Armanda Magic MD Signature Date/Time: 01/06/2022/11:05:44 AM    Final    ECHOCARDIOGRAM LIMITED  Result Date: 01/06/2022    ECHOCARDIOGRAM LIMITED REPORT   Patient Name:   Bryan Miller Date of Exam: 01/06/2022 Medical Rec #:  [id number]         Height:       74.0 in Accession #:    [id number]        Weight:       264.6 lb Date of Birth:  [birth year]          BSA:          2.448 m Patient Age:    70 years          BP:           107/67 mmHg Patient Gender: M                 HR:           60 bpm. Exam  Location:  Inpatient Procedure: Limited Echo and Intracardiac Opacification Agent Indications:    I47.2 Ventricular tachycardia  History:        Patient has prior history of Echocardiogram examinations, most                 recent 01/06/2021.  Sonographer:    [name] Referring Phys: [id number] MICHAEL ANDREW TILLERY  Sonographer Comments: Limited with definity for LV EF IMPRESSIONS  1. Left ventricular ejection fraction, by estimation, is 40 to 45%. The left ventricle has mildly decreased function. The left ventricle demonstrates global hypokinesis. FINDINGS  Armanda Magic MD Electronically signed by Armanda Magic MD Signature Date/Time: 01/06/2022/2:28:24 PM    Final     Patient Profile     Bryan Miller is a 71 y.o. male with hx of ICM s/p Bsci CRT-D, CAD s/p 2v CABG (LIMA-->LAD, SVG-->RCA (occluded)), CKD III, and prior VT who is being seen 01/06/2022 for the evaluation of ventricular tachycardia at the request of Dr. 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  Not an interventional lesion.   Discussed with patient and Dr. Graciela Husbands at length. Review of device transmission reveals that his AIVR/HR in the 100s is new, and was not present on recent histograms. With continued AIVR burden suggesting a potentially ischemic process, will add Ranolazine and continue to monitor as an inpatient.  26.600 cm/s LVOT VTI:          0.098 m LVOT/AV VTI ratio: 0.52  AORTA Ao Root diam: 3.80 cm Ao Asc diam:  3.00 cm MITRAL VALVE               TRICUSPID VALVE MV Area (PHT): 2.46 cm    TV Peak grad:   53.3 mmHg MV Peak grad:  87.6 mmHg   TV Vmax:        3.65 m/s MV Vmax:       4.68 m/s MV Decel Time: 309 msec    SHUNTS MV E velocity: 53.60 cm/s  Systemic VTI:  0.10 m MV A velocity: 39.60 cm/s  Systemic Diam: 2.40 cm MV E/A ratio:  1.35 Armanda Magic MD Electronically signed by Armanda Magic MD Signature Date/Time: 01/06/2022/11:05:44 AM    Final    ECHOCARDIOGRAM LIMITED  Result Date: 01/06/2022    ECHOCARDIOGRAM LIMITED REPORT   Patient Name:   Bryan Miller Date of Exam: 01/06/2022 Medical Rec #:  [id number]         Height:       74.0 in Accession #:    [id number]        Weight:       264.6 lb Date of Birth:  [birth year]          BSA:          2.448 m Patient Age:    70 years          BP:           107/67 mmHg Patient Gender: M                 HR:           60 bpm. Exam  Location:  Inpatient Procedure: Limited Echo and Intracardiac Opacification Agent Indications:    I47.2 Ventricular tachycardia  History:        Patient has prior history of Echocardiogram examinations, most                 recent 01/06/2021.  Sonographer:    [name] Referring Phys: [id number] MICHAEL ANDREW TILLERY  Sonographer Comments: Limited with definity for LV EF IMPRESSIONS  1. Left ventricular ejection fraction, by estimation, is 40 to 45%. The left ventricle has mildly decreased function. The left ventricle demonstrates global hypokinesis. FINDINGS  Armanda Magic MD Electronically signed by Armanda Magic MD Signature Date/Time: 01/06/2022/2:28:24 PM    Final     Patient Profile     Bryan Miller is a 71 y.o. male with hx of ICM s/p Bsci CRT-D, CAD s/p 2v CABG (LIMA-->LAD, SVG-->RCA (occluded)), CKD III, and prior VT who is being seen 01/06/2022 for the evaluation of ventricular tachycardia at the request of Dr. 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  Not an interventional lesion.   Discussed with patient and Dr. Graciela Husbands at length. Review of device transmission reveals that his AIVR/HR in the 100s is new, and was not present on recent histograms. With continued AIVR burden suggesting a potentially ischemic process, will add Ranolazine and continue to monitor as an inpatient.

## 2022-01-08 NOTE — TOC Initial Note (Signed)
Transition of Care [facility]) - Initial/Assessment Note    Patient Details  Name: Bryan Miller MRN: [id number] Date of Birth: Dec 27, 1951  [facility]) CM/SW Contact:    Gala Lewandowsky, RN Phone Number: 01/08/2022, 1:55 PM  Clinical Narrative: Case Manager received a consult for the Heart Failure Screen. Case Manager spoke with the patient and prior to admission, patient was independent from home with his spouse. Patient states he does not have any durable medical equipment (DME). Patient was still driving and working as a Runner, broadcasting/film/video at [employer]. Patient gets to all of his appointments without any issues and his PCP is Dr. Marina Goodell in [place], [facility]. Patient uses [facility] [place]. No home needs identified at this time. Case Manager will continue to follow for additional transition of care needs.                          Expected Discharge Plan: Home/Self Care Barriers to Discharge: Continued Medical Work up   Patient Goals and CMS Choice Patient states their goals for this hospitalization and ongoing recovery are:: to return home.   Choice offered to / list presented to : NA  Expected Discharge Plan and Services Expected Discharge Plan: Home/Self Care In-house Referral: NA Discharge Planning Services: CM Consult Post Acute Care Choice: NA                     DME Agency: NA     Prior Living Arrangements/Services   Lives with:: Spouse Patient language and need for interpreter reviewed:: Yes Do you feel safe going back to the place where you live?: Yes      Need for Family Participation in Patient Care: Yes (Comment) Care giver support system in place?: Yes (comment)   Criminal Activity/Legal Involvement Pertinent to Current Situation/Hospitalization: No - Comment as needed  Activities of Daily Living      Permission Sought/Granted Permission sought to share information with : Family Supports, Optician, dispensing, Case Manager                Emotional Assessment Appearance:: Appears stated age Attitude/Demeanor/Rapport: Engaged Affect (typically observed): Appropriate Orientation: : Oriented to  Time, Oriented to Place, Oriented to Self, Oriented to Situation Alcohol / Substance Use: Not Applicable Psych Involvement: No (comment)  Admission diagnosis:  Ventricular tachycardia [I47.20] Nonsustained ventricular tachycardia [I47.29] Near syncope [R55] Patient Active Problem List   Diagnosis Date Noted   Near syncope    Ventricular tachycardia 01/06/2022   Bronchopneumonia 12/22/2021   Morbid obesity (HCC) 10/26/2021   BMI 35.0-35.9,adult 06/26/2021   Sinusitis 05/29/2021   Ischemic dilated cardiomyopathy (HCC) 02/20/2021   Hyperlipidemia 08/15/2020   Ventricular tachycardia, monomorphic 09/14/2019   Left bundle branch block 09/14/2019   Subclinical hypothyroidism 04/24/2019   Dilated aortic root (HCC) 12/05/2018   Presence of cardiac defibrillator 10/19/2017   Advance directive on file 10/19/2017   Old myocardial infarction 06/26/2011   Personal history of other diseases of the digestive system 06/26/2011   Atherosclerotic heart disease of native coronary artery without angina pectoris 06/01/2011   Prediabetes 05/05/2010   PCP:  Abigail Miyamoto, MD Pharmacy:   [facility] [phone] - [place], [place] - [address] [facility] [address] [place] [address] Phone: [phone] Fax: [phone]    Readmission Risk Interventions No flowsheet data found.

## 2022-01-08 NOTE — Plan of Care (Signed)
  Problem: Activity: Goal: Risk for activity intolerance will decrease Outcome: Progressing   Problem: Activity: Goal: Risk for activity intolerance will decrease Outcome: Progressing   

## 2022-01-08 NOTE — Progress Notes (Signed)
Mobility Specialist Progress Note    01/08/22 1603  Mobility  Activity Ambulated in hall  Level of Assistance Standby assist, set-up cues, supervision of patient - no hands on  Assistive Device None  Distance Ambulated (ft) 1000 ft  Mobility Ambulated independently in hallway  Mobility Response Tolerated well  Mobility performed by Mobility specialist  $Mobility charge 1 Mobility   Pt received standing at sink and agreeable. No complaints on walk. Maintained conversation. Returned to room with RN present.   [facility] Mobility Specialist  M.S. 2C and 6E: [phone] M.S. 4E: [phone]

## 2022-01-09 DIAGNOSIS — I472 Ventricular tachycardia, unspecified: Secondary | ICD-10-CM | POA: Diagnosis not present

## 2022-01-09 DIAGNOSIS — I2510 Atherosclerotic heart disease of native coronary artery without angina pectoris: Secondary | ICD-10-CM | POA: Diagnosis not present

## 2022-01-09 DIAGNOSIS — R55 Syncope and collapse: Secondary | ICD-10-CM | POA: Diagnosis not present

## 2022-01-09 DIAGNOSIS — I255 Ischemic cardiomyopathy: Secondary | ICD-10-CM | POA: Diagnosis not present

## 2022-01-09 DIAGNOSIS — I5022 Chronic systolic (congestive) heart failure: Secondary | ICD-10-CM

## 2022-01-09 DIAGNOSIS — I420 Dilated cardiomyopathy: Secondary | ICD-10-CM

## 2022-01-09 LAB — BASIC METABOLIC PANEL
Anion gap: 7 (ref 5–15)
BUN: 18 mg/dL (ref 8–23)
CO2: 24 mmol/L (ref 22–32)
Calcium: 8.7 mg/dL — ABNORMAL LOW (ref 8.9–10.3)
Chloride: 106 mmol/L (ref 98–111)
Creatinine, Ser: 1.05 mg/dL (ref 0.61–1.24)
GFR, Estimated: >60 mL/min (ref >60)
Glucose, Bld: 103 mg/dL — ABNORMAL HIGH (ref 70–99)
Potassium: 4.3 mmol/L (ref 3.5–5.1)
Sodium: 137 mmol/L (ref 135–145)

## 2022-01-09 LAB — MAGNESIUM: Magnesium: 2 mg/dL (ref 1.7–2.4)

## 2022-01-09 NOTE — Progress Notes (Signed)
Electrophysiology Rounding Note  Patient Name: Bryan Miller Date of Encounter: 01/09/2022  Primary Physician: Abigail Miyamoto, MD Primary Cardiologist: [facility] in [place] (Dr Hoy Finlay (interventional cardiology at [facility]) [phone]) Electrophysiologist: [facility] in [place] (? Dr. Christie Beckers)   Subjective  Ambulated yday No sob or chest pain Episode this am of presyncope (See Below)   Inpatient Medications    Scheduled Meds:  amiodarone  400 mg Oral BID   aspirin EC  81 mg Oral Daily   atorvastatin  40 mg Oral Daily   clopidogrel  75 mg Oral Daily   enoxaparin (LOVENOX) injection  40 mg Subcutaneous Q24H   isosorbide mononitrate  30 mg Oral Daily   levothyroxine  88 mcg Oral Q0600   metoprolol succinate  25 mg Oral QHS   ranolazine  500 mg Oral BID   sodium chloride flush  3 mL Intravenous Q12H   sodium chloride flush  3 mL Intravenous Q12H   sodium chloride flush  3 mL Intravenous Q12H   spironolactone  12.5 mg Oral Daily   Continuous Infusions:  sodium chloride 20 mL (01/07/22 0749)   sodium chloride     PRN Meds: sodium chloride, sodium chloride, acetaminophen, acetaminophen, ondansetron (ZOFRAN) IV, ondansetron (ZOFRAN) IV, sodium chloride flush, sodium chloride flush   Vital Signs    Vitals:   01/08/22 1543 01/08/22 2042 01/09/22 0119 01/09/22 0456  BP: 117/73 109/65 117/74 110/66  Pulse: 67 60 83 71  Resp: 20 19 18 19   Temp: 97.9 F (36.6 C) 97.9 F (36.6 C) 97.9 F (36.6 C) 97.9 F (36.6 C)  TempSrc: Oral Oral Oral Oral  SpO2: 100% 97% 95% 95%  Weight:    122.7 kg  Height:        Intake/Output Summary (Last 24 hours) at 01/09/2022 1037 Last data filed at 01/09/2022 0856 Gross per 24 hour  Intake 720 ml  Output 1925 ml  Net -1205 ml    Filed Weights   01/07/22 0420 01/08/22 0550 01/09/22 0456  Weight: 122.6 kg 122.4 kg 122.7 kg    Physical Exam    Well developed and nourished in no acute distress HENT  normal Neck supple Clear Regular rate and rhythm, no murmurs or gallops Abd-soft with active BS No Clubbing cyanosis edema Skin-warm and dry A & Oriented  Grossly normal sensory and motor function     Labs    CBC No results for input(s): WBC, NEUTROABS, HGB, HCT, MCV, PLT in the last 72 hours.  Basic Metabolic Panel Recent Labs    01/11/22 0409 01/09/22 0309  NA 137 137  K 4.3 4.3  CL 105 106  CO2 24 24  GLUCOSE 102* 103*  BUN 14 18  CREATININE 1.13 1.05  CALCIUM 8.7* 8.7*  MG 2.0 2.0       Telemetry  NSR with slow  VT now very infrequent Episode of presyncope (about the same time) as VTNS about 150  Radiology    CARDIAC CATHETERIZATION  Result Date: 01/07/2022 CONCLUSIONS: Severe native vessel coronary disease with total occlusion of the proximal and mid RCA, mid LAD, and moderate diffuse disease in the first diagonal.  The dominant obtuse marginal branch contains 50% stenosis. Patent LIMA to mid/distal LAD is widely patent but with diffuse disease both antegrade and retrograde to graft insertion site. Total occlusion of saphenous vein graft to distal RCA. Normal LVEDP, 11 mmHg. RECOMMENDATIONS: The mid and distal LAD segments could be causing ischemia induced arrhythmia  but are not approachable with PCI.  Medical therapy/ablation recommended for recurrent VT.    Patient Profile     ROSHON Miller is a 71 y.o. male with hx of ICM s/p Bsci CRT-D, CAD s/p 2v CABG (LIMA-->LAD, SVG-->RCA (occluded)), CKD III, and prior VT who is being seen 01/06/2022 for the evaluation of ventricular tachycardia at the request of Dr. 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 Continue aldactone 12.5 mg.  And will resume furosemide but will change to 40 qod to start tomorrow

## 2022-01-10 ENCOUNTER — Encounter (HOSPITAL_COMMUNITY): Payer: Self-pay | Admitting: Internal Medicine

## 2022-01-10 ENCOUNTER — Other Ambulatory Visit: Payer: Self-pay | Admitting: Physician Assistant

## 2022-01-10 DIAGNOSIS — E0590 Thyrotoxicosis, unspecified without thyrotoxic crisis or storm: Secondary | ICD-10-CM

## 2022-01-10 DIAGNOSIS — N1831 Chronic kidney disease, stage 3a: Secondary | ICD-10-CM

## 2022-01-10 LAB — BASIC METABOLIC PANEL
Anion gap: 7 (ref 5–15)
BUN: 20 mg/dL (ref 8–23)
CO2: 24 mmol/L (ref 22–32)
Calcium: 9.2 mg/dL (ref 8.9–10.3)
Chloride: 106 mmol/L (ref 98–111)
Creatinine, Ser: 1.34 mg/dL — ABNORMAL HIGH (ref 0.61–1.24)
GFR, Estimated: 57 mL/min — ABNORMAL LOW (ref >60)
Glucose, Bld: 104 mg/dL — ABNORMAL HIGH (ref 70–99)
Potassium: 4.6 mmol/L (ref 3.5–5.1)
Sodium: 137 mmol/L (ref 135–145)

## 2022-01-10 LAB — MAGNESIUM: Magnesium: 2 mg/dL (ref 1.7–2.4)

## 2022-01-10 MED ORDER — LISINOPRIL 5 MG PO TABS
2.5000 mg | ORAL_TABLET | Freq: Every day | ORAL | Status: DC
  Administered 2022-01-10: 2.5 mg via ORAL
  Filled 2022-01-10: qty 1

## 2022-01-10 MED ORDER — LEVOTHYROXINE SODIUM 88 MCG PO TABS: 88.0000 ug | ORAL_TABLET | Freq: Every day | ORAL | Status: DC

## 2022-01-10 MED ORDER — LEVOTHYROXINE SODIUM 88 MCG PO TABS
88.0000 ug | ORAL_TABLET | Freq: Every day | ORAL | Status: DC
Start: 2022-01-11 — End: 2022-01-10

## 2022-01-10 MED ORDER — LEVOTHYROXINE SODIUM 88 MCG PO TABS: 88.0000 ug | ORAL_TABLET | Freq: Every day | ORAL | 1 refills | Status: DC

## 2022-01-10 MED ORDER — AMIODARONE HCL 200 MG PO TABS
ORAL_TABLET | ORAL | 0 refills | Status: DC
Start: 2022-01-10 — End: 2022-02-08

## 2022-01-10 MED ORDER — METOPROLOL SUCCINATE ER 25 MG PO TB24: 25.0000 mg | ORAL_TABLET | Freq: Every day | ORAL | 1 refills | Status: DC

## 2022-01-10 MED ORDER — ISOSORBIDE MONONITRATE ER 30 MG PO TB24: 30.0000 mg | ORAL_TABLET | Freq: Every day | ORAL | 1 refills | Status: DC

## 2022-01-10 MED ORDER — FUROSEMIDE 40 MG PO TABS: 40.0000 mg | ORAL_TABLET | ORAL | 1 refills | Status: DC

## 2022-01-10 MED ORDER — FUROSEMIDE 40 MG PO TABS
40.0000 mg | ORAL_TABLET | ORAL | Status: DC
  Administered 2022-01-10: 40 mg via ORAL
  Filled 2022-01-10: qty 1

## 2022-01-10 MED ORDER — RANOLAZINE ER 500 MG PO TB12
500.0000 mg | ORAL_TABLET | Freq: Two times a day (BID) | ORAL | 1 refills | Status: DC
Start: 2022-01-10 — End: 2022-02-08

## 2022-01-10 NOTE — Discharge Summary (Signed)
Discharge Summary    Patient ID: Bryan Miller MRN: [id number]; DOB: [birth year]  Admit date: 01/05/2022 Discharge date: 01/10/2022  PCP:  Lillard Anes, MD   [facility] Primary Cardiologist: [facility] in [place] (Dr Brunetta Jeans (interventional cardiology at [facility] heart and vascular) [phone]) Electrophysiologist: [facility] in [place] (Dr. Margarito Courser)  Click here to update MD or APP on Care Team, Refresh:1}     Discharge Diagnoses    Principal Problem:   Ventricular tachycardia Active Problems:   Presence of cardiac defibrillator   Atherosclerotic heart disease of native coronary artery without angina pectoris   Ischemic dilated cardiomyopathy (HCC)   Dilated aortic root (HCC)   Left bundle branch block   Hypothyroidism   Morbid obesity ([place])   Near syncope   Hyperthyroidism   Chronic kidney disease, stage 3a ([place])   Diagnostic Studies/Procedures    Cardiac Cath 01/07/22 CONCLUSIONS: Severe native vessel coronary disease with total occlusion of the proximal and mid RCA, mid LAD, and moderate diffuse disease in the first diagonal.  The dominant obtuse marginal branch contains 50% stenosis. Patent LIMA to mid/distal LAD is widely patent but with diffuse disease both antegrade and retrograde to graft insertion site. Total occlusion of saphenous vein graft to distal RCA. Normal LVEDP, 11 mmHg.   RECOMMENDATIONS:   The mid and distal LAD segments could be causing ischemia induced arrhythmia but are not approachable with PCI.   Medical therapy/ablation recommended for recurrent VT.  2D echo 01/06/22  1. EF cannot be estimated due to poor acoustical windows and inability to  visualize endocardial borders. Left ventricular endocardial border not  optimally defined to evaluate regional wall motion. The left ventricular  internal cavity size was moderately   dilated.   2. Right ventricular systolic function was not well visualized. The right  ventricular  size is normal. Tricuspid regurgitation signal is inadequate  for assessing PA pressure.   3. Left atrial size was severely dilated.   4. The mitral valve is normal in structure. No evidence of mitral valve  regurgitation. No evidence of mitral stenosis.   5. The aortic valve is normal in structure. Aortic valve regurgitation is  not visualized. No aortic stenosis is present.   6. Aortic dilatation noted. There is mild dilatation of the aortic root,  measuring 38 mm.   Conclusion(s)/Recommendation(s): Recommend repeat limited study with  definity contrast.   Limited echo 01/06/22 IMPRESSIONS   1. Left ventricular ejection fraction, by estimation, is 40 to 45%. The  left ventricle has mildly decreased function. The left ventricle  demonstrates global hypokinesis.   FINDINGS   Fransico Him MD  Electronically signed by Fransico Him MD  Signature Date/Time: 01/06/2022/2:28:24 PM  _____________   History of Present Illness     Bryan Miller is a 71 y.o. male with CAD s/p 2V CABG 20 years ago (LIMA-LAD, occluded SVG-RCA), LBBB, ICM s/p BSci CRT-D, CKD stage 3, prior VT, HLD, pre-DM, hypothyroidism who presented to the hospital with VT.  Per H&P, he had CABGx 2 20 years ago.  Postoperatively he had systolic CHF and has been on GDMT since, at times limited by BP. CRT was placed in [place] approx 5 years ago for primary prevention, though also states has been on sotalol for VT suppression. He was switched to amiodarone several years ago by his best knowledge after VT/VF treated by device. He was seen in [place] approx 5 years ago with CP and vein graft had occluded. Per outside cath  Discharge Summary    Patient ID: Bryan Miller MRN: [id number]; DOB: [birth year]  Admit date: 01/05/2022 Discharge date: 01/10/2022  PCP:  Lillard Anes, MD   [place] Prov[facility] Primary Cardiologist: [facility] in [place] (Dr Brunetta Jeans (interventional cardiology at [facility] heart and vascular) [phone]) Electrophysiologist: [facility] in [place] (Dr. Margarito Courser)  Click here to update MD or APP on Care Team, Refresh:1}     Discharge Diagnoses    Principal Problem:   Ventricular tachycardia Active Problems:   Presence of cardiac defibrillator   Atherosclerotic heart disease of native coronary artery without angina pectoris   Ischemic dilated cardiomyopathy (HCC)   Dilated aortic root (HCC)   Left bundle branch block   Hypothyroidism   Morbid obesity ([place])   Near syncope   Hyperthyroidism   Chronic kidney disease, stage 3a ([place])   Diagnostic Studies/Procedures    Cardiac Cath 01/07/22 CONCLUSIONS: Severe native vessel coronary disease with total occlusion of the proximal and mid RCA, mid LAD, and moderate diffuse disease in the first diagonal.  The dominant obtuse marginal branch contains 50% stenosis. Patent LIMA to mid/distal LAD is widely patent but with diffuse disease both antegrade and retrograde to graft insertion site. Total occlusion of saphenous vein graft to distal RCA. Normal LVEDP, 11 mmHg.   RECOMMENDATIONS:   The mid and distal LAD segments could be causing ischemia induced arrhythmia but are not approachable with PCI.   Medical therapy/ablation recommended for recurrent VT.  2D echo 01/06/22  1. EF cannot be estimated due to poor acoustical windows and inability to  visualize endocardial borders. Left ventricular endocardial border not  optimally defined to evaluate regional wall motion. The left ventricular  internal cavity size was moderately   dilated.   2. Right ventricular systolic function was not well visualized. The right  ventricular  size is normal. Tricuspid regurgitation signal is inadequate  for assessing PA pressure.   3. Left atrial size was severely dilated.   4. The mitral valve is normal in structure. No evidence of mitral valve  regurgitation. No evidence of mitral stenosis.   5. The aortic valve is normal in structure. Aortic valve regurgitation is  not visualized. No aortic stenosis is present.   6. Aortic dilatation noted. There is mild dilatation of the aortic root,  measuring 38 mm.   Conclusion(s)/Recommendation(s): Recommend repeat limited study with  definity contrast.   Limited echo 01/06/22 IMPRESSIONS   1. Left ventricular ejection fraction, by estimation, is 40 to 45%. The  left ventricle has mildly decreased function. The left ventricle  demonstrates global hypokinesis.   FINDINGS   Fransico Him MD  Electronically signed by Fransico Him MD  Signature Date/Time: 01/06/2022/2:28:24 PM  _____________   History of Present Illness     Bryan Miller is a 71 y.o. male with CAD s/p 2V CABG 20 years ago (LIMA-LAD, occluded SVG-RCA), LBBB, ICM s/p BSci CRT-D, CKD stage 3, prior VT, HLD, pre-DM, hypothyroidism who presented to the hospital with VT.  Per H&P, he had CABGx 2 20 years ago.  Postoperatively he had systolic CHF and has been on GDMT since, at times limited by BP. CRT was placed in [place] approx 5 years ago for primary prevention, though also states has been on sotalol for VT suppression. He was switched to amiodarone several years ago by his best knowledge after VT/VF treated by device. He was seen in [place] approx 5 years ago with CP and vein graft had occluded. Per outside cath  Discharge Summary    Patient ID: Bryan Miller MRN: [id number]; DOB: [birth year]  Admit date: 01/05/2022 Discharge date: 01/10/2022  PCP:  Lillard Anes, MD   [place] Prov[facility] Primary Cardiologist: [facility] in [place] (Dr Brunetta Jeans (interventional cardiology at [facility] heart and vascular) [phone]) Electrophysiologist: [facility] in [place] (Dr. Margarito Courser)  Click here to update MD or APP on Care Team, Refresh:1}     Discharge Diagnoses    Principal Problem:   Ventricular tachycardia Active Problems:   Presence of cardiac defibrillator   Atherosclerotic heart disease of native coronary artery without angina pectoris   Ischemic dilated cardiomyopathy (HCC)   Dilated aortic root (HCC)   Left bundle branch block   Hypothyroidism   Morbid obesity ([place])   Near syncope   Hyperthyroidism   Chronic kidney disease, stage 3a ([place])   Diagnostic Studies/Procedures    Cardiac Cath 01/07/22 CONCLUSIONS: Severe native vessel coronary disease with total occlusion of the proximal and mid RCA, mid LAD, and moderate diffuse disease in the first diagonal.  The dominant obtuse marginal branch contains 50% stenosis. Patent LIMA to mid/distal LAD is widely patent but with diffuse disease both antegrade and retrograde to graft insertion site. Total occlusion of saphenous vein graft to distal RCA. Normal LVEDP, 11 mmHg.   RECOMMENDATIONS:   The mid and distal LAD segments could be causing ischemia induced arrhythmia but are not approachable with PCI.   Medical therapy/ablation recommended for recurrent VT.  2D echo 01/06/22  1. EF cannot be estimated due to poor acoustical windows and inability to  visualize endocardial borders. Left ventricular endocardial border not  optimally defined to evaluate regional wall motion. The left ventricular  internal cavity size was moderately   dilated.   2. Right ventricular systolic function was not well visualized. The right  ventricular  size is normal. Tricuspid regurgitation signal is inadequate  for assessing PA pressure.   3. Left atrial size was severely dilated.   4. The mitral valve is normal in structure. No evidence of mitral valve  regurgitation. No evidence of mitral stenosis.   5. The aortic valve is normal in structure. Aortic valve regurgitation is  not visualized. No aortic stenosis is present.   6. Aortic dilatation noted. There is mild dilatation of the aortic root,  measuring 38 mm.   Conclusion(s)/Recommendation(s): Recommend repeat limited study with  definity contrast.   Limited echo 01/06/22 IMPRESSIONS   1. Left ventricular ejection fraction, by estimation, is 40 to 45%. The  left ventricle has mildly decreased function. The left ventricle  demonstrates global hypokinesis.   FINDINGS   Fransico Him MD  Electronically signed by Fransico Him MD  Signature Date/Time: 01/06/2022/2:28:24 PM  _____________   History of Present Illness     Bryan Miller is a 71 y.o. male with CAD s/p 2V CABG 20 years ago (LIMA-LAD, occluded SVG-RCA), LBBB, ICM s/p BSci CRT-D, CKD stage 3, prior VT, HLD, pre-DM, hypothyroidism who presented to the hospital with VT.  Per H&P, he had CABGx 2 20 years ago.  Postoperatively he had systolic CHF and has been on GDMT since, at times limited by BP. CRT was placed in [place] approx 5 years ago for primary prevention, though also states has been on sotalol for VT suppression. He was switched to amiodarone several years ago by his best knowledge after VT/VF treated by device. He was seen in [place] approx 5 years ago with CP and vein graft had occluded. Per outside cath  Final    ECHOCARDIOGRAM LIMITED  Result Date: 01/06/2022    ECHOCARDIOGRAM LIMITED REPORT   Patient Name:   Bryan Miller Date of Exam: 01/06/2022 Medical Rec #:  [id number]         Height:       74.0 in Accession #:    [id number]        Weight:       264.6 lb Date of Birth:  10-12-1951          BSA:          2.448 m Patient Age:    64 years          BP:           107/67 mmHg Patient Gender: M                 HR:           60 bpm. Exam Location:  Inpatient Procedure: Limited Echo and Intracardiac Opacification Agent Indications:    I47.2 Ventricular tachycardia  History:        Patient has prior history of Echocardiogram examinations, most                 recent 01/06/2021.  Sonographer:    Beryle Beams Referring Phys: [id number] MICHAEL ANDREW TILLERY  Sonographer Comments: Limited with definity for LV EF IMPRESSIONS  1. Left ventricular ejection fraction, by estimation, is 40 to 45%. The left ventricle has mildly decreased function. The left ventricle demonstrates global hypokinesis. FINDINGS  Fransico Him MD Electronically signed by Fransico Him MD Signature Date/Time: 01/06/2022/2:28:24 PM    Final    Disposition   Pt is being discharged home today in good condition.  Follow-up Plans & Appointments     Follow-up Information     Cardiology Team @ [facility] Follow up.   Why: Please call your primary cardiology team tomorrow morning to arrange an early post-hospital appointment.        [facility] Follow up.   Specialty: Cardiology Why: If you have any questions regarding your hospital stay, you may call this phone number for [place]. Contact information: [address], [address] [place]        Lillard Anes, MD Follow up.   Specialty: Family Medicine Why: Your thyroid medicine had to be adjusted due to decreased TSH and elevated free T4 - please contact primary care  provider to discuss timing of recheck. Contact information: [address] [place] [address] [phone]                Discharge Instructions     Diet - low sodium heart healthy   Complete by: As directed    Discharge instructions   Complete by: As directed    Please take note of multiple medication changes as outlined.  Patients with heart disease should avoid stimulant-type medicines. This includes cold/allergy medicines that contain pseudoephedrine or phenylephrine. This is why we stopped your Alka Seltzer Plus Cold. Sometimes on the bottle it will say "-D" to indicate the decongestant, which you'll need to avoid. Please make sure to pay attention to labels.  Amoxicillin-clavulanate (Augmentin), guaifenesin-codeine (Cheratussin) syrup, and prednisone were removed from your medicine list as you indicated you were no longer taking these prior to admission.  If you develop any symptoms of lightheadedness, feeling faint, passing out or ICD shocks, call 911/seek medical attention immediately.   Increase activity  Final    ECHOCARDIOGRAM LIMITED  Result Date: 01/06/2022    ECHOCARDIOGRAM LIMITED REPORT   Patient Name:   Bryan Miller Date of Exam: 01/06/2022 Medical Rec #:  [id number]         Height:       74.0 in Accession #:    [id number]        Weight:       264.6 lb Date of Birth:  10-12-1951          BSA:          2.448 m Patient Age:    64 years          BP:           107/67 mmHg Patient Gender: M                 HR:           60 bpm. Exam Location:  Inpatient Procedure: Limited Echo and Intracardiac Opacification Agent Indications:    I47.2 Ventricular tachycardia  History:        Patient has prior history of Echocardiogram examinations, most                 recent 01/06/2021.  Sonographer:    Beryle Beams Referring Phys: [id number] MICHAEL ANDREW TILLERY  Sonographer Comments: Limited with definity for LV EF IMPRESSIONS  1. Left ventricular ejection fraction, by estimation, is 40 to 45%. The left ventricle has mildly decreased function. The left ventricle demonstrates global hypokinesis. FINDINGS  Fransico Him MD Electronically signed by Fransico Him MD Signature Date/Time: 01/06/2022/2:28:24 PM    Final    Disposition   Pt is being discharged home today in good condition.  Follow-up Plans & Appointments     Follow-up Information     Cardiology Team @ [facility] Follow up.   Why: Please call your primary cardiology team tomorrow morning to arrange an early post-hospital appointment.        [facility] Follow up.   Specialty: Cardiology Why: If you have any questions regarding your hospital stay, you may call this phone number for [place]. Contact information: [address], [address] [place]        Lillard Anes, MD Follow up.   Specialty: Family Medicine Why: Your thyroid medicine had to be adjusted due to decreased TSH and elevated free T4 - please contact primary care  provider to discuss timing of recheck. Contact information: [address] [place] [address] [phone]                Discharge Instructions     Diet - low sodium heart healthy   Complete by: As directed    Discharge instructions   Complete by: As directed    Please take note of multiple medication changes as outlined.  Patients with heart disease should avoid stimulant-type medicines. This includes cold/allergy medicines that contain pseudoephedrine or phenylephrine. This is why we stopped your Alka Seltzer Plus Cold. Sometimes on the bottle it will say "-D" to indicate the decongestant, which you'll need to avoid. Please make sure to pay attention to labels.  Amoxicillin-clavulanate (Augmentin), guaifenesin-codeine (Cheratussin) syrup, and prednisone were removed from your medicine list as you indicated you were no longer taking these prior to admission.  If you develop any symptoms of lightheadedness, feeling faint, passing out or ICD shocks, call 911/seek medical attention immediately.   Increase activity  Final    ECHOCARDIOGRAM LIMITED  Result Date: 01/06/2022    ECHOCARDIOGRAM LIMITED REPORT   Patient Name:   Bryan Miller Date of Exam: 01/06/2022 Medical Rec #:  [id number]         Height:       74.0 in Accession #:    [id number]        Weight:       264.6 lb Date of Birth:  10-12-1951          BSA:          2.448 m Patient Age:    64 years          BP:           107/67 mmHg Patient Gender: M                 HR:           60 bpm. Exam Location:  Inpatient Procedure: Limited Echo and Intracardiac Opacification Agent Indications:    I47.2 Ventricular tachycardia  History:        Patient has prior history of Echocardiogram examinations, most                 recent 01/06/2021.  Sonographer:    Beryle Beams Referring Phys: [id number] MICHAEL ANDREW TILLERY  Sonographer Comments: Limited with definity for LV EF IMPRESSIONS  1. Left ventricular ejection fraction, by estimation, is 40 to 45%. The left ventricle has mildly decreased function. The left ventricle demonstrates global hypokinesis. FINDINGS  Fransico Him MD Electronically signed by Fransico Him MD Signature Date/Time: 01/06/2022/2:28:24 PM    Final    Disposition   Pt is being discharged home today in good condition.  Follow-up Plans & Appointments     Follow-up Information     Cardiology Team @ [facility] Follow up.   Why: Please call your primary cardiology team tomorrow morning to arrange an early post-hospital appointment.        [facility] Follow up.   Specialty: Cardiology Why: If you have any questions regarding your hospital stay, you may call this phone number for [place]. Contact information: [address], [address] [place]        Lillard Anes, MD Follow up.   Specialty: Family Medicine Why: Your thyroid medicine had to be adjusted due to decreased TSH and elevated free T4 - please contact primary care  provider to discuss timing of recheck. Contact information: [address] [place] [address] [phone]                Discharge Instructions     Diet - low sodium heart healthy   Complete by: As directed    Discharge instructions   Complete by: As directed    Please take note of multiple medication changes as outlined.  Patients with heart disease should avoid stimulant-type medicines. This includes cold/allergy medicines that contain pseudoephedrine or phenylephrine. This is why we stopped your Alka Seltzer Plus Cold. Sometimes on the bottle it will say "-D" to indicate the decongestant, which you'll need to avoid. Please make sure to pay attention to labels.  Amoxicillin-clavulanate (Augmentin), guaifenesin-codeine (Cheratussin) syrup, and prednisone were removed from your medicine list as you indicated you were no longer taking these prior to admission.  If you develop any symptoms of lightheadedness, feeling faint, passing out or ICD shocks, call 911/seek medical attention immediately.   Increase activity  Final    ECHOCARDIOGRAM LIMITED  Result Date: 01/06/2022    ECHOCARDIOGRAM LIMITED REPORT   Patient Name:   Bryan Miller Date of Exam: 01/06/2022 Medical Rec #:  [id number]         Height:       74.0 in Accession #:    [id number]        Weight:       264.6 lb Date of Birth:  10-12-1951          BSA:          2.448 m Patient Age:    64 years          BP:           107/67 mmHg Patient Gender: M                 HR:           60 bpm. Exam Location:  Inpatient Procedure: Limited Echo and Intracardiac Opacification Agent Indications:    I47.2 Ventricular tachycardia  History:        Patient has prior history of Echocardiogram examinations, most                 recent 01/06/2021.  Sonographer:    Beryle Beams Referring Phys: [id number] MICHAEL ANDREW TILLERY  Sonographer Comments: Limited with definity for LV EF IMPRESSIONS  1. Left ventricular ejection fraction, by estimation, is 40 to 45%. The left ventricle has mildly decreased function. The left ventricle demonstrates global hypokinesis. FINDINGS  Fransico Him MD Electronically signed by Fransico Him MD Signature Date/Time: 01/06/2022/2:28:24 PM    Final    Disposition   Pt is being discharged home today in good condition.  Follow-up Plans & Appointments     Follow-up Information     Cardiology Team @ [facility] Follow up.   Why: Please call your primary cardiology team tomorrow morning to arrange an early post-hospital appointment.        [facility] Follow up.   Specialty: Cardiology Why: If you have any questions regarding your hospital stay, you may call this phone number for [place]. Contact information: [address], [address] [place]        Lillard Anes, MD Follow up.   Specialty: Family Medicine Why: Your thyroid medicine had to be adjusted due to decreased TSH and elevated free T4 - please contact primary care  provider to discuss timing of recheck. Contact information: [address] [place] [address] [phone]                Discharge Instructions     Diet - low sodium heart healthy   Complete by: As directed    Discharge instructions   Complete by: As directed    Please take note of multiple medication changes as outlined.  Patients with heart disease should avoid stimulant-type medicines. This includes cold/allergy medicines that contain pseudoephedrine or phenylephrine. This is why we stopped your Alka Seltzer Plus Cold. Sometimes on the bottle it will say "-D" to indicate the decongestant, which you'll need to avoid. Please make sure to pay attention to labels.  Amoxicillin-clavulanate (Augmentin), guaifenesin-codeine (Cheratussin) syrup, and prednisone were removed from your medicine list as you indicated you were no longer taking these prior to admission.  If you develop any symptoms of lightheadedness, feeling faint, passing out or ICD shocks, call 911/seek medical attention immediately.   Increase activity  Discharge Summary    Patient ID: Bryan Miller MRN: [id number]; DOB: [birth year]  Admit date: 01/05/2022 Discharge date: 01/10/2022  PCP:  Lillard Anes, MD   [place] Prov[facility] Primary Cardiologist: [facility] in [place] (Dr Brunetta Jeans (interventional cardiology at [facility] heart and vascular) [phone]) Electrophysiologist: [facility] in [place] (Dr. Margarito Courser)  Click here to update MD or APP on Care Team, Refresh:1}     Discharge Diagnoses    Principal Problem:   Ventricular tachycardia Active Problems:   Presence of cardiac defibrillator   Atherosclerotic heart disease of native coronary artery without angina pectoris   Ischemic dilated cardiomyopathy (HCC)   Dilated aortic root (HCC)   Left bundle branch block   Hypothyroidism   Morbid obesity ([place])   Near syncope   Hyperthyroidism   Chronic kidney disease, stage 3a ([place])   Diagnostic Studies/Procedures    Cardiac Cath 01/07/22 CONCLUSIONS: Severe native vessel coronary disease with total occlusion of the proximal and mid RCA, mid LAD, and moderate diffuse disease in the first diagonal.  The dominant obtuse marginal branch contains 50% stenosis. Patent LIMA to mid/distal LAD is widely patent but with diffuse disease both antegrade and retrograde to graft insertion site. Total occlusion of saphenous vein graft to distal RCA. Normal LVEDP, 11 mmHg.   RECOMMENDATIONS:   The mid and distal LAD segments could be causing ischemia induced arrhythmia but are not approachable with PCI.   Medical therapy/ablation recommended for recurrent VT.  2D echo 01/06/22  1. EF cannot be estimated due to poor acoustical windows and inability to  visualize endocardial borders. Left ventricular endocardial border not  optimally defined to evaluate regional wall motion. The left ventricular  internal cavity size was moderately   dilated.   2. Right ventricular systolic function was not well visualized. The right  ventricular  size is normal. Tricuspid regurgitation signal is inadequate  for assessing PA pressure.   3. Left atrial size was severely dilated.   4. The mitral valve is normal in structure. No evidence of mitral valve  regurgitation. No evidence of mitral stenosis.   5. The aortic valve is normal in structure. Aortic valve regurgitation is  not visualized. No aortic stenosis is present.   6. Aortic dilatation noted. There is mild dilatation of the aortic root,  measuring 38 mm.   Conclusion(s)/Recommendation(s): Recommend repeat limited study with  definity contrast.   Limited echo 01/06/22 IMPRESSIONS   1. Left ventricular ejection fraction, by estimation, is 40 to 45%. The  left ventricle has mildly decreased function. The left ventricle  demonstrates global hypokinesis.   FINDINGS   Fransico Him MD  Electronically signed by Fransico Him MD  Signature Date/Time: 01/06/2022/2:28:24 PM  _____________   History of Present Illness     Bryan Miller is a 71 y.o. male with CAD s/p 2V CABG 20 years ago (LIMA-LAD, occluded SVG-RCA), LBBB, ICM s/p BSci CRT-D, CKD stage 3, prior VT, HLD, pre-DM, hypothyroidism who presented to the hospital with VT.  Per H&P, he had CABGx 2 20 years ago.  Postoperatively he had systolic CHF and has been on GDMT since, at times limited by BP. CRT was placed in [place] approx 5 years ago for primary prevention, though also states has been on sotalol for VT suppression. He was switched to amiodarone several years ago by his best knowledge after VT/VF treated by device. He was seen in [place] approx 5 years ago with CP and vein graft had occluded. Per outside cath

## 2022-01-22 ENCOUNTER — Other Ambulatory Visit: Payer: Self-pay | Admitting: *Deleted

## 2022-01-22 ENCOUNTER — Other Ambulatory Visit: Payer: Self-pay

## 2022-01-22 ENCOUNTER — Ambulatory Visit (INDEPENDENT_AMBULATORY_CARE_PROVIDER_SITE_OTHER): Payer: Medicare HMO | Admitting: Physician Assistant

## 2022-01-22 ENCOUNTER — Encounter: Payer: Self-pay | Admitting: Physician Assistant

## 2022-01-22 VITALS — BP 93/58 | HR 67 | Ht 74.0 in | Wt 273.8 lb

## 2022-01-22 DIAGNOSIS — I2510 Atherosclerotic heart disease of native coronary artery without angina pectoris: Secondary | ICD-10-CM | POA: Diagnosis not present

## 2022-01-22 DIAGNOSIS — I472 Ventricular tachycardia, unspecified: Secondary | ICD-10-CM

## 2022-01-22 DIAGNOSIS — Z79899 Other long term (current) drug therapy: Secondary | ICD-10-CM | POA: Diagnosis not present

## 2022-01-22 DIAGNOSIS — Z95810 Presence of automatic (implantable) cardiac defibrillator: Secondary | ICD-10-CM

## 2022-01-22 DIAGNOSIS — I255 Ischemic cardiomyopathy: Secondary | ICD-10-CM | POA: Diagnosis not present

## 2022-01-22 DIAGNOSIS — I5022 Chronic systolic (congestive) heart failure: Secondary | ICD-10-CM | POA: Diagnosis not present

## 2022-01-22 MED ORDER — METOPROLOL TARTRATE 25 MG PO TABS: 25.0000 mg | ORAL_TABLET | Freq: Every day | ORAL | 3 refills | Status: DC

## 2022-01-22 NOTE — Progress Notes (Signed)
Cardiology Office Note Date:  01/22/2022  Patient ID:  Bryan Miller [birth year], MRN [id number] PCP:  Abigail Miyamoto, MD  Cardiologist:   [facility] in [place] (Dr Hoy Finlay (interventional cardiology at [facility] heart and vascular) [phone]) Electrophysiologist: new to Dr. Graciela Husbands    Chief Complaint:  post hospital  History of Present Illness: Bryan Miller is a 71 y.o. male with history of ICM s/p Bsci CRT-D, CAD s/p 2v CABG,  CKD III, VT, hypothyroidism, HTN, HLD  Admitted to [facility] 01/05/22 with progressive SOB, fatigue, near syncope, noted to have VT with multiple ATP therapies by his device. PMHx well laid out by notes being treated in [place], [place] and now [facility]).  Historically having been on sotalol and back and forth on/off/on amiodarone  He was started on amio gtt, telemetry noted slow VTs, with reduced BP%. Had cath noting multivessel disease, (cath report below), treated medically. TTE w/LVEF 40-45% Transitioned back to oral amio  Dr. Graciela Husbands recommended: 400mg  BID x 2 weeks at discharge then 400mg  daily until seen by [facility] heart team - it was felt that if he had recurrent VT, mexiletine could be considered, but the patient had no sustained arrhythmias over the last 24 hours on current regimen so was not felt to require mexiletine at this time Lopressor > Toprol ACE, BP not able to tolerate Entresto THS was LOW and his synthroid reduced, instructed to f/u with PMD Discharged 01/10/22 to f/u with his attending cardiology team  TODAY He is accompanied by his wife today. He would like to transfer his cardiac care to [facility] including his device management and monitoring, he lives in [place], and this is an easier ride, very much liked Dr. Graciela Husbands and the care he got. He feels like it is taking a long time to get back to feeling well. Very tired, napping , no energy. BP at home mostly in the 90's, some into low 100s systolic. No CP, no palpitations, no near syncope or  syncope sinc home. 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[employer], does not feel like he is ready to get back to work, just has no energy at all  There is some uncertainty about which metoprolol he is taking at home   Device information BSCi CRT-D implanted 10/17/2017   Past Medical History:  Diagnosis Date   CAD (coronary artery disease)    Chronic kidney disease, stage 3a (HCC)    Chronic systolic CHF (congestive heart failure) (HCC)    Dilated aortic root (HCC) 12/05/2018   Hyperlipidemia 08/15/2020   Hypothyroidism    Left bundle branch block 09/14/2019   Old myocardial infarction 06/26/2011   Prediabetes 05/05/2010   Presence of cardiac defibrillator    S/P CABG (coronary artery bypass graft)    Ventricular tachycardia, monomorphic     Past Surgical History:  Procedure Laterality Date   ABDOMINAL HERNIA REPAIR  2020   bypass  2001   CHOLECYSTECTOMY  1996   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   LEFT HEART CATH AND CORS/GRAFTS ANGIOGRAPHY N/A 01/07/2022   Procedure: LEFT HEART CATH AND CORS/GRAFTS ANGIOGRAPHY;  Surgeon: Lyn Records, MD;  Location: [facility] INVASIVE CV LAB;  Service: Cardiovascular;  Laterality: N/A;    Current Outpatient Medications  Medication Sig Dispense Refill   acetaminophen (TYLENOL) 650 MG CR tablet Take 650 mg by mouth every 8 (eight) hours as needed for pain.     amiodarone (PACERONE) 200 MG tablet Take 2 tablets (  400 mg) by mouth twice daily for 2 weeks (through 01/23/22), then decrease to 2 tablets (400 mg) once daily 100 tablet 0   aspirin 81 MG EC tablet Take 1 tablet by mouth daily.     atorvastatin (LIPITOR) 40 MG tablet Take 1 tablet (40 mg total) by mouth daily. (Patient taking differently: Take 40 mg by mouth at bedtime.) 90 tablet 2   clopidogrel (PLAVIX) 75 MG tablet Take 1 tablet (75 mg total) by mouth daily. 90 tablet 2   furosemide (LASIX) 40 MG tablet Take 1 tablet (40 mg total) by mouth every other day. 15 tablet 1    isosorbide mononitrate (IMDUR) 30 MG 24 hr tablet Take 1 tablet (30 mg total) by mouth daily. 30 tablet 1   levothyroxine (SYNTHROID) 88 MCG tablet Take 1 tablet (88 mcg total) by mouth daily before breakfast. 30 tablet 1   loratadine (CLARITIN) 10 MG tablet Take 10 mg by mouth daily as needed for allergies.     nitroGLYCERIN (NITROSTAT) 0.4 MG SL tablet Place 0.4 mg under the tongue every 5 (five) minutes as needed for chest pain.     OVER THE COUNTER MEDICATION Place 1 spray into both nostrils as needed (nasal congestion). Xytiol     ranolazine (RANEXA) 500 MG 12 hr tablet Take 1 tablet (500 mg total) by mouth 2 (two) times daily. 60 tablet 1   spironolactone (ALDACTONE) 25 MG tablet Take 0.5 tablets by mouth daily.     metoprolol tartrate (LOPRESSOR) 25 MG tablet Take 1 tablet (25 mg total) by mouth daily. 90 tablet 3   No current facility-administered medications for this visit.    Allergies:   Patient has no known allergies.   Social History:  The patient  reports that he quit smoking about 53 years ago. His smoking use included cigarettes. He has never used smokeless tobacco. He reports that he does not currently use alcohol. He reports that he does not use drugs.   Family History:  The patient's family history includes Dementia in his mother; Heart Problems in his father and mother.  ROS:  Please see the history of present illness.    All other systems are reviewed and otherwise negative.   PHYSICAL EXAM:  VS:  BP (!) 93/58    Pulse 67    Ht 6\' 2"  (1.88 m)    Wt 273 lb 12.8 oz (124.2 kg)    SpO2 98%    BMI 35.15 kg/m  BMI: Body mass index is 35.15 kg/m. Well nourished, well developed, in no acute distress HEENT: normocephalic, atraumatic Neck: no JVD, carotid bruits or masses Cardiac:  RRR; no significant murmurs, no rubs, or gallops Lungs:  CTA b/l, no wheezing, rhonchi or rales Abd: soft, nontender MS: no deformity or atrophy Ext: no edema Skin: warm and dry, no rash Neuro:   No gross deficits appreciated Psych: euthymic mood, full affect  ICD site is stable, no tethering or discomfort   EKG:  done today and reviewed by myself SR/ V pacing  Device interrogation done today and reviewed by myself:  Battery and lead measurements are good No new arrhythmias With hand rubbing and isometrics there is some noise on SHOCK EGM, none with arm mobements or pocket manipulations Device did not see the minimal artifact Lead measurements are good  01/07/2022: LHC Occluded vein graft to the right coronary.  Mid occlusion of the right coronary native vessel. Total occlusion of the mid LAD and first diagonal. Widely patent circumflex  artery.  Collaterals to the distal RCA are noted Widely patent left main. Normal LVEDP at 11 mmHg. Patent LIMA to the LAD with 95% stenosis in the mid LAD retrograde/proximal to the internal mammary insertion site leaving the mid LAD segment potentially ischemic.  This is not reachable and not a technically feasible site for PCI due to angulation.   2D echo 01/06/22  1. EF cannot be estimated due to poor acoustical windows and inability to  visualize endocardial borders. Left ventricular endocardial border not  optimally defined to evaluate regional wall motion. The left ventricular  internal cavity size was moderately   dilated.   2. Right ventricular systolic function was not well visualized. The right  ventricular size is normal. Tricuspid regurgitation signal is inadequate  for assessing PA pressure.   3. Left atrial size was severely dilated.   4. The mitral valve is normal in structure. No evidence of mitral valve  regurgitation. No evidence of mitral stenosis.   5. The aortic valve is normal in structure. Aortic valve regurgitation is  not visualized. No aortic stenosis is present.   6. Aortic dilatation noted. There is mild dilatation of the aortic root,  measuring 38 mm.   Conclusion(s)/Recommendation(s): Recommend repeat  limited study with  definity contrast.    Limited echo 01/06/22 IMPRESSIONS   1. Left ventricular ejection fraction, by estimation, is 40 to 45%. The  left ventricle has mildly decreased function. The left ventricle  demonstrates global hypokinesis.    Recent Labs: 10/26/2021: ALT 16 01/05/2022: B Natriuretic Peptide 217.4; Hemoglobin 14.0; Platelets 235 01/06/2022: TSH 0.222 01/10/2022: BUN 20; Creatinine, Ser 1.34; Magnesium 2.0; Potassium 4.6; Sodium 137  01/06/2022: Cholesterol 121; HDL 36; LDL Cholesterol 63; Total CHOL/HDL Ratio 3.4; Triglycerides 111; VLDL 22   Estimated Creatinine Clearance: 70.8 mL/min (A) (by C-G formula based on SCr of 1.34 mg/dL (H)).   Wt Readings from Last 3 Encounters:  01/22/22 273 lb 12.8 oz (124.2 kg)  01/10/22 278 lb (126.1 kg)  12/22/21 272 lb (123.4 kg)     Other studies reviewed: Additional studies/records reviewed today include: summarized above  ASSESSMENT AND PLAN:  ICD Intact function Slight artifact noise on shock EGM/RV, not noted by the device Stable lead measurements Will have device clinic request device monitoring from his prior EP MD, appears to be Dr. Lanae Boast [facility])  CAD Cath noted above No CP On BB, nitrate, Ranexa, statin  Chronic CHF ICM No symptoms or exam findings of volume OL On ASA, plavix, BB, ACE, diuretics  BP on the low side, he is quite tired, having a hard time getting is baseline stamina back after his hospital stay At least for now, will hold his lisinopril and see if this helps his energy if BP get a bit higher He brings a work form to return to work, but he does not think he is ready. He will reach out in a week or so and let us know how he is doing and fill out his paper work. Urged to try and increase slowly his activities and re-conditioning   F/u BMET today, Creat was on the rise at time of discharge, post contrast/meds He needs general/interventional cardiologist Will refer to Dr.  Lynnette Caffey   VT Amiodarone Reminded no driving 84mo Will continue amiodarone titration as planned     Disposition: F/u with remotes once transferred to [place], in clinic with EP in 2 mo, sooner if needed.   Current medicines are reviewed at length with the patient  today.  The patient did not have any concerns regarding medicines.  Norma Fredrickson, PA-C 01/22/2022 5:44 PM     [facility] [address] [place] [address] [phone] (office)  [phone] (fax)

## 2022-01-22 NOTE — Patient Instructions (Addendum)
Medication Instructions:   STOP TAKING LISINOPRIL 2.5 MG   *If you need a refill on your cardiac medications before your next appointment, please call your pharmacy*   Lab Work:  BMET TODAY    If you have labs (blood work) drawn today and your tests are completely normal, you will receive your results only by: [place] (if you have MyChart) OR A paper copy in the mail If you have any lab test that is abnormal or we need to change your treatment, we will call you to review the results.   Testing/Procedures: NONE ORDERED  TODAY    Follow-Up: At [facility], you and your health needs are our priority.  As part of our continuing mission to provide you with exceptional heart care, we have created designated Provider Care Teams.  These Care Teams include your primary Cardiologist (physician) and Advanced Practice Providers (APPs -  Physician Assistants and Nurse Practitioners) who all work together to provide you with the care you need, when you need it.  We recommend signing up for the patient portal called "MyChart".  Sign up information is provided on this After Visit Summary.  MyChart is used to connect with patients for Virtual Visits (Telemedicine).  Patients are able to view lab/test results, encounter notes, upcoming appointments, etc.  Non-urgent messages can be sent to your provider as well.   To learn more about what you can do with MyChart, go to [email or web address].    Your next appointment:   3 month(s)  The format for your next appointment:   In Person  Provider:   Virl Axe, MD{     Other Instructions

## 2022-01-23 LAB — BASIC METABOLIC PANEL
BUN/Creatinine Ratio: 11 (ref 10–24)
BUN: 13 mg/dL (ref 8–27)
CO2: 24 mmol/L (ref 20–29)
Calcium: 9.6 mg/dL (ref 8.6–10.2)
Chloride: 102 mmol/L (ref 96–106)
Creatinine, Ser: 1.15 mg/dL (ref 0.76–1.27)
Glucose: 95 mg/dL (ref 70–99)
Potassium: 4.7 mmol/L (ref 3.5–5.2)
Sodium: 140 mmol/L (ref 134–144)
eGFR: 68 mL/min/{1.73_m2} (ref >59)

## 2022-01-25 ENCOUNTER — Telehealth (HOSPITAL_COMMUNITY): Payer: Self-pay | Admitting: *Deleted

## 2022-01-25 MED ORDER — METOPROLOL SUCCINATE ER 25 MG PO TB24: 25.0000 mg | ORAL_TABLET | Freq: Every day | ORAL | 3 refills | Status: DC

## 2022-01-25 NOTE — Telephone Encounter (Signed)
Lvm for patient to call back about results and recommendations

## 2022-01-25 NOTE — Telephone Encounter (Signed)
-----   Message from Sheilah Pigeon, [place] sent at 01/25/2022 12:33 PM EST ----- BMET looks good.  Creat back wnl.   For his amiodarone He is should be down to 400mg  daily (as of yesterday), I would like for him to stay on 400mg  daily for 2 weeks Then got please have him reduce to 200mg  daily please also He was going to let know how his BP was doing/how he was feeling, off the lisinopril.

## 2022-01-25 NOTE — Addendum Note (Signed)
Addended by: Claude Manges on: 01/25/2022 11:37 AM   Modules accepted: Orders

## 2022-01-27 ENCOUNTER — Encounter: Payer: Self-pay | Admitting: Legal Medicine

## 2022-01-27 ENCOUNTER — Ambulatory Visit (INDEPENDENT_AMBULATORY_CARE_PROVIDER_SITE_OTHER): Payer: Medicare HMO | Admitting: Legal Medicine

## 2022-01-27 ENCOUNTER — Other Ambulatory Visit: Payer: Self-pay

## 2022-01-27 ENCOUNTER — Ambulatory Visit: Payer: Medicare HMO | Admitting: Legal Medicine

## 2022-01-27 VITALS — BP 100/60 | HR 69 | Temp 98.2°F | Resp 15 | Ht 74.0 in | Wt 276.0 lb

## 2022-01-27 DIAGNOSIS — I420 Dilated cardiomyopathy: Secondary | ICD-10-CM

## 2022-01-27 DIAGNOSIS — R7303 Prediabetes: Secondary | ICD-10-CM | POA: Diagnosis not present

## 2022-01-27 DIAGNOSIS — I77810 Thoracic aortic ectasia: Secondary | ICD-10-CM

## 2022-01-27 DIAGNOSIS — I472 Ventricular tachycardia, unspecified: Secondary | ICD-10-CM

## 2022-01-27 DIAGNOSIS — Z6835 Body mass index (BMI) 35.0-35.9, adult: Secondary | ICD-10-CM | POA: Diagnosis not present

## 2022-01-27 DIAGNOSIS — I2510 Atherosclerotic heart disease of native coronary artery without angina pectoris: Secondary | ICD-10-CM | POA: Diagnosis not present

## 2022-01-27 DIAGNOSIS — I255 Ischemic cardiomyopathy: Secondary | ICD-10-CM

## 2022-01-27 DIAGNOSIS — E782 Mixed hyperlipidemia: Secondary | ICD-10-CM

## 2022-01-27 DIAGNOSIS — E038 Other specified hypothyroidism: Secondary | ICD-10-CM

## 2022-01-27 DIAGNOSIS — N1831 Chronic kidney disease, stage 3a: Secondary | ICD-10-CM

## 2022-01-27 DIAGNOSIS — I4729 Other ventricular tachycardia: Secondary | ICD-10-CM

## 2022-01-27 MED ORDER — CEPHALEXIN 500 MG PO CAPS: 500.0000 mg | ORAL_CAPSULE | Freq: Four times a day (QID) | ORAL | 0 refills | Status: DC

## 2022-01-27 NOTE — Progress Notes (Signed)
Subjective:  Patient ID: Bryan Miller, male    DOB: 23-Sep-1951  Age: 71 y.o. MRN: [id number]  Chief Complaint  Patient presents with   Hyperlipidemia   Hypertension   Hypothyroidism    HPI Patient was admitted to [facility] on 01/05/2022 and discharged 01/10/2022. He was diagnosed with Ventricular Tachycardia. He had a cardiac cath on 01/07/2022 and it showed Severe native vessel coronary disease with total occlusion of the proximal and mid RCA, mid LAD, and moderate diffuse disease in the first diagonal.  The dominant obtuse marginal branch contains 50% stenosis. Patent LIMA to mid/distal LAD is widely patent but with diffuse disease both antegrade and retrograde to graft insertion site. Total occlusion of saphenous vein graft to distal RCA. Normal LVEDP, 11 mmHg. Copied from cath:   They checked the presence of cardiac defibrillator.  Hyperlipidemia: Patient is taking atorvastatin 40 mg daily. Patient presents with hyperlipidemia.  Compliance with treatment has been good; patient takes medicines as directed, maintains low cholesterol diet, follows up as directed, and maintains exercise regimen.  Patient is using atorvastatin without problems.   Ischemic dilated Cardiomyopathy:  He takes Metoprolol 25 mg daily, Spironolactone 25 mg 1/2 tablet daily, aspirin 81 mg daily,Isosorbide 30 mg daily.  Ventricular tachycardia: Patient started amiodarone 200 mg twice a day. nO further tschycardia on event monitor  Hypothyroidism: Taking levothyroxine 88 mcg daily. Current Outpatient Medications on File Prior to Visit  Medication Sig Dispense Refill   acetaminophen (TYLENOL) 650 MG CR tablet Take 650 mg by mouth every 8 (eight) hours as needed for pain.     amiodarone (PACERONE) 200 MG tablet Take 2 tablets (400 mg) by mouth twice daily for 2 weeks (through 01/23/22), then decrease to 2 tablets (400 mg) once daily 100 tablet 0   aspirin 81 MG EC tablet Take 1 tablet by mouth daily.      atorvastatin (LIPITOR) 40 MG tablet Take 1 tablet (40 mg total) by mouth daily. (Patient taking differently: Take 40 mg by mouth at bedtime.) 90 tablet 2   clopidogrel (PLAVIX) 75 MG tablet Take 1 tablet (75 mg total) by mouth daily. 90 tablet 2   furosemide (LASIX) 40 MG tablet Take 1 tablet (40 mg total) by mouth every other day. 15 tablet 1   isosorbide mononitrate (IMDUR) 30 MG 24 hr tablet Take 1 tablet (30 mg total) by mouth daily. 30 tablet 1   levothyroxine (SYNTHROID) 88 MCG tablet Take 1 tablet (88 mcg total) by mouth daily before breakfast. 30 tablet 1   loratadine (CLARITIN) 10 MG tablet Take 10 mg by mouth daily as needed for allergies.     metoprolol succinate (TOPROL-XL) 25 MG 24 hr tablet Take 1 tablet (25 mg total) by mouth daily. Take with or immediately following a meal. 90 tablet 3   nitroGLYCERIN (NITROSTAT) 0.4 MG SL tablet Place 0.4 mg under the tongue every 5 (five) minutes as needed for chest pain.     OVER THE COUNTER MEDICATION Place 1 spray into both nostrils as needed (nasal congestion). Xytiol     ranolazine (RANEXA) 500 MG 12 hr tablet Take 1 tablet (500 mg total) by mouth 2 (two) times daily. 60 tablet 1   spironolactone (ALDACTONE) 25 MG tablet Take 0.5 tablets by mouth daily.     No current facility-administered medications on file prior to visit.   Past Medical History:  Diagnosis Date   CAD (coronary artery disease)    Chronic kidney disease, stage 3a (  HCC)    Chronic systolic CHF (congestive heart failure) (HCC)    Dilated aortic root (HCC) 12/05/2018   Hyperlipidemia 08/15/2020   Hypothyroidism    Left bundle branch block 09/14/2019   Old myocardial infarction 06/26/2011   Prediabetes 05/05/2010   Presence of cardiac defibrillator    S/P CABG (coronary artery bypass graft)    Ventricular tachycardia, monomorphic    Past Surgical History:  Procedure Laterality Date   ABDOMINAL HERNIA REPAIR  2020   bypass  2001   CHOLECYSTECTOMY  1996    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   LEFT HEART CATH AND CORS/GRAFTS ANGIOGRAPHY N/A 01/07/2022   Procedure: LEFT HEART CATH AND CORS/GRAFTS ANGIOGRAPHY;  Surgeon: Lyn Records, MD;  Location: [facility];  Service: Cardiovascular;  Laterality: N/A;    Family History  Problem Relation Age of Onset   Dementia Mother    Heart Problems Mother    Heart Problems Father    Social History   Socioeconomic History   Marital status: Married    Spouse name: Not on file   Number of children: 3   Years of education: Not on file   Highest education level: Not on file  Occupational History   Not on file  Tobacco Use   Smoking status: Former    Types: Cigarettes    Quit date: 1970    Years since quitting: 53.1   Smokeless tobacco: Never  Vaping Use   Vaping Use: Never used  Substance and Sexual Activity   Alcohol use: Not Currently   Drug use: Never   Sexual activity: Yes    Partners: Female  Other Topics Concern   Not on file  Social History Narrative   Retired Runner, broadcasting/film/video.  Lives with wife.    Social Determinants of Health   Financial Resource Strain: Not on file  Food Insecurity: Not on file  Transportation Needs: Not on file  Physical Activity: Not on file  Stress: Not on file  Social Connections: Not on file    Review of Systems  Constitutional:  Negative for chills, fatigue, fever and unexpected weight change.  HENT:  Negative for congestion, ear pain, sinus pain and sore throat.   Eyes:  Negative for visual disturbance.  Respiratory:  Positive for shortness of breath. Negative for cough.   Cardiovascular:  Negative for chest pain and palpitations.  Gastrointestinal:  Negative for abdominal pain, blood in stool, constipation, diarrhea, nausea and vomiting.  Endocrine: Negative for polydipsia.  Genitourinary:  Negative for dysuria.  Musculoskeletal:  Negative for back pain.  Skin:  Negative for rash.  Neurological:  Negative for headaches.   Psychiatric/Behavioral: Negative.      Objective:  BP 100/60    Pulse 69    Temp 98.2 F (36.8 C)    Resp 15    Ht 6\' 2"  (1.88 m)    Wt 276 lb (125.2 kg)    SpO2 98%    BMI 35.44 kg/m   BP/Weight 01/27/2022 01/22/2022 01/10/2022  Systolic BP 100 93 104  Diastolic BP 60 58 92  Wt. (Lbs) 276 273.8 278  BMI 35.44 35.15 35.69    Physical Exam Vitals reviewed.  Constitutional:      General: He is not in acute distress.    Appearance: Normal appearance. He is obese.  HENT:     Head: Normocephalic.     Right Ear: Tympanic membrane, ear canal and external ear normal.     Left Ear: Tympanic membrane,  ear canal and external ear normal.     Nose: Nose normal.     Mouth/Throat:     Mouth: Mucous membranes are moist.     Pharynx: Oropharynx is clear. No posterior oropharyngeal erythema.  Eyes:     Extraocular Movements: Extraocular movements intact.     Conjunctiva/sclera: Conjunctivae normal.     Pupils: Pupils are equal, round, and reactive to light.  Neck:     Vascular: No carotid bruit.  Cardiovascular:     Rate and Rhythm: Normal rate and regular rhythm.     Pulses: Normal pulses.     Heart sounds: Normal heart sounds. No murmur heard.   No gallop.  Pulmonary:     Effort: Pulmonary effort is normal. No respiratory distress.     Breath sounds: Normal breath sounds. No wheezing.  Abdominal:     General: Bowel sounds are normal. There is no distension.     Palpations: Abdomen is soft. There is no mass.     Tenderness: There is no abdominal tenderness.  Musculoskeletal:        General: Normal range of motion.     Cervical back: Normal range of motion. No tenderness.     Right lower leg: Edema (2+) present.     Left lower leg: Edema (2+) present.  Skin:    General: Skin is warm.     Capillary Refill: Capillary refill takes 2 to 3 seconds.     Findings: Erythema present.     Comments: Patient has small area of folliculitis in groin after shaving in hospital  Neurological:      General: No focal deficit present.     Mental Status: He is alert and oriented to person, place, and time. Mental status is at baseline.     Gait: Gait normal.     Deep Tendon Reflexes: Reflexes normal.  Psychiatric:        Mood and Affect: Mood normal.        Behavior: Behavior normal.        Thought Content: Thought content normal.    Diabetic Foot Exam - Simple   Simple Foot Form Diabetic Foot exam was performed with the following findings: Yes 01/27/2022  8:55 AM  Visual Inspection No deformities, no ulcerations, no other skin breakdown bilaterally: Yes Sensation Testing Intact to touch and monofilament testing bilaterally: Yes Pulse Check Posterior Tibialis and Dorsalis pulse intact bilaterally: Yes Comments 2 + edema in both feet.      Lab Results  Component Value Date   WBC 8.2 01/05/2022   HGB 14.0 01/05/2022   HCT 43.9 01/05/2022   PLT 235 01/05/2022   GLUCOSE 95 01/22/2022   CHOL 121 01/06/2022   TRIG 111 01/06/2022   HDL 36 (L) 01/06/2022   LDLCALC 63 01/06/2022   ALT 16 10/26/2021   AST 19 10/26/2021   NA 140 01/22/2022   K 4.7 01/22/2022   CL 102 01/22/2022   CREATININE 1.15 01/22/2022   BUN 13 01/22/2022   CO2 24 01/22/2022   TSH 0.222 (L) 01/06/2022   HGBA1C 5.8 (H) 01/06/2022      Assessment & Plan:   Problem List Items Addressed This Visit       Cardiovascular and Mediastinum   Atherosclerotic heart disease of native coronary artery without angina pectoris An individual plan was formulated based on patient history and exam, labs and evidence based data. Patient has not had recent angina or nitroglycerin use. continue present treatment.  Ischemic dilated cardiomyopathy (HCC)   Relevant Orders   Comprehensive metabolic panel   CBC with Differential/Platelet Patient has dilated cardiomyopathy with no active failure    Ventricular tachycardia, monomorphic Recently admitted for this, controlled with amiodarone    Thoracic aortic ectasia  [facility]) Being followed by cardiology         Endocrine   Subclinical hypothyroidism   Relevant Orders   TSH Continue to follow TSH, patient is on levothyroxine     Genitourinary   Chronic kidney disease, stage 3a (HCC) Kidney disease is stable     Other   Hyperlipidemia   Relevant Orders   Lipid panel AN INDIVIDUAL CARE PLAN for hyperlipidemia/ cholesterol was established and reinforced today.  The patient's status was assessed using clinical findings on exam, lab and other diagnostic tests. The patient's disease status was assessed based on evidence-based guidelines and found to be well controlled. MEDICATIONS were reviewed. SELF MANAGEMENT GOALS have been discussed and patient's success at attaining the goal of low cholesterol was assessed. RECOMMENDATION given include regular exercise 3 days a week and low cholesterol/low fat diet. CLINICAL SUMMARY including written plan to identify barriers unique to the patient due to social or economic  reasons was discussed.     Prediabetes - Primary   Relevant Orders   Hemoglobin A1c Patient has prediabetes and is stable    BMI 35.0-35.9,adult An individualize plan was formulated for obesity using patient history and physical exam to encourage weight loss.  An evidence based program was formulated.  Patient is to cut portion size with meals and to plan physical exercise 3 days a week at least 20 minutes.  Weight watchers and other programs are helpful.  Planned amount of weight loss 10 lbs. With hypertension and hypercholesterolemia patient meets criteria for morbid obesity  .Morbid obesity We discussed weight loss 30 minute visit with review of cardiac records Meds ordered this encounter  Medications   cephALEXin (KEFLEX) 500 MG capsule    Sig: Take 1 capsule (500 mg total) by mouth 4 (four) times daily.    Dispense:  20 capsule    Refill:  0    Orders Placed This Encounter  Procedures   Comprehensive metabolic panel    Hemoglobin A1c   Lipid panel   CBC with Differential/Platelet   TSH     Follow-up: Return in about 3 months (around 04/26/2022) for fasting.  An After Visit Summary was printed and given to the patient.  Brent Bulla, MD [facility] [phone]

## 2022-01-28 LAB — COMPREHENSIVE METABOLIC PANEL
ALT: 22 IU/L (ref 0–44)
AST: 24 IU/L (ref 0–40)
Albumin/Globulin Ratio: 1.7 (ref 1.2–2.2)
Albumin: 4 g/dL (ref 3.7–4.7)
Alkaline Phosphatase: 92 IU/L (ref 44–121)
BUN/Creatinine Ratio: 11 (ref 10–24)
BUN: 12 mg/dL (ref 8–27)
Bilirubin Total: 0.6 mg/dL (ref 0.0–1.2)
CO2: 23 mmol/L (ref 20–29)
Calcium: 9.3 mg/dL (ref 8.6–10.2)
Chloride: 103 mmol/L (ref 96–106)
Creatinine, Ser: 1.08 mg/dL (ref 0.76–1.27)
Globulin, Total: 2.4 g/dL (ref 1.5–4.5)
Glucose: 94 mg/dL (ref 70–99)
Potassium: 4.6 mmol/L (ref 3.5–5.2)
Sodium: 140 mmol/L (ref 134–144)
Total Protein: 6.4 g/dL (ref 6.0–8.5)
eGFR: 73 mL/min/{1.73_m2} (ref >59)

## 2022-01-28 LAB — CBC WITH DIFFERENTIAL/PLATELET
Basophils Absolute: 0 10*3/uL (ref 0.0–0.2)
Basos: 0 %
EOS (ABSOLUTE): 0.3 10*3/uL (ref 0.0–0.4)
Eos: 4 %
Hematocrit: 43.1 % (ref 37.5–51.0)
Hemoglobin: 14.7 g/dL (ref 13.0–17.7)
Immature Grans (Abs): 0 10*3/uL (ref 0.0–0.1)
Immature Granulocytes: 0 %
Lymphocytes Absolute: 2.2 10*3/uL (ref 0.7–3.1)
Lymphs: 24 %
MCH: 32.2 pg (ref 26.6–33.0)
MCHC: 34.1 g/dL (ref 31.5–35.7)
MCV: 94 fL (ref 79–97)
Monocytes Absolute: 1 10*3/uL — ABNORMAL HIGH (ref 0.1–0.9)
Monocytes: 11 %
Neutrophils Absolute: 5.5 10*3/uL (ref 1.4–7.0)
Neutrophils: 61 %
Platelets: 269 10*3/uL (ref 150–450)
RBC: 4.57 x10E6/uL (ref 4.14–5.80)
RDW: 12.8 % (ref 11.6–15.4)
WBC: 9.1 10*3/uL (ref 3.4–10.8)

## 2022-01-28 LAB — HEMOGLOBIN A1C
Est. average glucose Bld gHb Est-mCnc: 126 mg/dL
Hgb A1c MFr Bld: 6 % — ABNORMAL HIGH (ref 4.8–5.6)

## 2022-01-28 LAB — LIPID PANEL
Chol/HDL Ratio: 3.1 ratio (ref 0.0–5.0)
Cholesterol, Total: 137 mg/dL (ref 100–199)
HDL: 44 mg/dL (ref >39)
LDL Chol Calc (NIH): 58 mg/dL (ref 0–99)
Triglycerides: 216 mg/dL — ABNORMAL HIGH (ref 0–149)
VLDL Cholesterol Cal: 35 mg/dL (ref 5–40)

## 2022-01-28 LAB — TSH: TSH: 0.116 u[IU]/mL — ABNORMAL LOW (ref 0.450–4.500)

## 2022-01-28 LAB — CARDIOVASCULAR RISK ASSESSMENT

## 2022-01-28 NOTE — Progress Notes (Signed)
A1c 6.0, triglycerides high 216, CBC normal, TSH low .116 thyroid recently lowered, kidney and liver tests normal lp

## 2022-01-29 ENCOUNTER — Telehealth: Payer: Self-pay | Admitting: Physician Assistant

## 2022-01-29 NOTE — Telephone Encounter (Signed)
°  Pt calling to f/u his mychart message about his work note. He said he needs it today because he has to go to work on [employer]

## 2022-01-29 NOTE — Telephone Encounter (Signed)
Attempted to call with no answer.  Sent MyChart message requesting some additional information.  Need to determine if Pt drives for his job.

## 2022-01-29 NOTE — Telephone Encounter (Signed)
Spoke with Pt.  Will send letter via fax to [facility] HR at [phone].  Pt was appreciative.  Plans to follow with [facility] going forward.  Understands no driving for 6 months.

## 2022-02-06 ENCOUNTER — Other Ambulatory Visit: Payer: Self-pay | Admitting: Physician Assistant

## 2022-02-22 ENCOUNTER — Ambulatory Visit: Payer: Medicare HMO | Admitting: Internal Medicine

## 2022-02-22 ENCOUNTER — Encounter: Payer: Self-pay | Admitting: Internal Medicine

## 2022-02-22 ENCOUNTER — Other Ambulatory Visit: Payer: Self-pay

## 2022-02-22 VITALS — BP 90/60 | HR 68 | Ht 74.0 in | Wt 276.0 lb

## 2022-02-22 DIAGNOSIS — I472 Ventricular tachycardia, unspecified: Secondary | ICD-10-CM

## 2022-02-22 DIAGNOSIS — E782 Mixed hyperlipidemia: Secondary | ICD-10-CM

## 2022-02-22 DIAGNOSIS — Z951 Presence of aortocoronary bypass graft: Secondary | ICD-10-CM | POA: Diagnosis not present

## 2022-02-22 DIAGNOSIS — I509 Heart failure, unspecified: Secondary | ICD-10-CM | POA: Diagnosis not present

## 2022-02-22 DIAGNOSIS — I255 Ischemic cardiomyopathy: Secondary | ICD-10-CM | POA: Diagnosis not present

## 2022-02-22 DIAGNOSIS — I10 Essential (primary) hypertension: Secondary | ICD-10-CM | POA: Diagnosis not present

## 2022-02-22 MED ORDER — LOSARTAN POTASSIUM 25 MG PO TABS: 12.5000 mg | ORAL_TABLET | Freq: Every day | ORAL | 3 refills | Status: DC

## 2022-02-22 MED ORDER — EMPAGLIFLOZIN 10 MG PO TABS: 10.0000 mg | ORAL_TABLET | Freq: Every day | ORAL | 3 refills | Status: DC

## 2022-02-22 NOTE — Patient Instructions (Signed)
Medication Instructions:  STOP ISOSORBIDE START JARDIANCE 10 MG EVERY DAY lOSARTAN 12.5 MG AT BEDTIME METOPROLOL 25 MG AT BEDTIME SPIRONIOLACTONE 12.5 MG AT BEDTIME  *If you need a refill on your cardiac medications before your next appointment, please call your pharmacy*   Lab Work: BMET  1 WEEK If you have labs (blood work) drawn today and your tests are completely normal, you will receive your results only by: MyChart Message (if you have MyChart) OR A paper copy in the mail If you have any lab test that is abnormal or we need to change your treatment, we will call you to review the results.   Testing/Procedures: NONE   Follow-Up: At [facility], you and your health needs are our priority.  As part of our continuing mission to provide you with exceptional heart care, we have created designated Provider Care Teams.  These Care Teams include your primary Cardiologist (physician) and Advanced Practice Providers (APPs -  Physician Assistants and Nurse Practitioners) who all work together to provide you with the care you need, when you need it.  We recommend signing up for the patient portal called "MyChart".  Sign up information is provided on this After Visit Summary.  MyChart is used to connect with patients for Virtual Visits (Telemedicine).  Patients are able to view lab/test results, encounter notes, upcoming appointments, etc.  Non-urgent messages can be sent to your provider as well.   To learn more about what you can do with MyChart, go to [email or web address].    Your next appointment:   2 month(s)  The format for your next appointment:   In Person  Provider:   DR Lynnette Caffey  If primary card or EP is not listed click here to update    :1}    Other Instructions NONE

## 2022-02-22 NOTE — Progress Notes (Signed)
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  We will stop Imdur and start losartan 12.5 mg at bedtime.  I will have him take his spironolactone at bedtime.  We will start losartan 12.5 mg at bedtime.  I will check a BMP in 1 week.  Follow-up in 2 months or earlier if needed..  2.  Continue Plavix monotherapy, statin, beta-blocker, and aggressive blood pressure control.  3.  Hypertension  4.  LDL recently was at goal.  We will check LP(a) in the future.  5.  Seen by EP recently and on amiodarone taper.  We will continue Toprol XL.            Dispo:  No follow-ups on file.     Medication Adjustments/Labs and Tests Ordered: Current medicines are reviewed at length with the patient today.  Concerns regarding medicines are outlined above.   Tests Ordered: No orders of the defined types were placed in this encounter.   Medication Changes: No orders of the defined types were placed in this encounter.   History of Present Illness:    FOCUSED CARDIOVASCULAR PROBLEM LIST:   1.  Coronary artery disease status post CABG 2003 consisting of a LIMA to LAD, and vein graft to RCA; coronary angiography January 2023 demonstrated vein graft to RCA was occluded 2.  Ischemic cardiomyopathy with ejection fraction approximately 40% 3.  Hypertension 4.  Hyperlipidemia 5.  Hypothyroidism   The patient is a 71 y.o. male with the indicated  medical history here to establish general cardiovascular care.  He was recently hospitalized in January due to ventricular tachycardia.  He was treated medically with amiodarone taper.  He was seen in the EP clinic in late January and was doing well.  He has noted that his weight is up a few pounds since discharge.  He is noted some mild peripheral edema particularly in the evening.  He denies any chest pain, palpitations, paroxysmal atrial dyspnea, orthopnea.  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  He is not driving.  He continues to get headaches in response to Imdur.  He does have some nausea in the morning that seems transient.  He has no early satiety.  He has been completely compliant with his medications.        Previous Medical History: Past Medical History:  Diagnosis Date   CAD (coronary artery disease)    Chronic kidney disease, stage 3a (HCC)    Chronic systolic CHF (congestive heart failure) (HCC)    Dilated aortic root (HCC) 12/05/2018   Hyperlipidemia 08/15/2020   Hypothyroidism    Left bundle branch block 09/14/2019   Old myocardial infarction 06/26/2011   Prediabetes 05/05/2010   Presence of cardiac defibrillator    S/P CABG (coronary artery bypass graft)    Ventricular tachycardia, monomorphic      Current Medications: Current Meds  Medication Sig   acetaminophen (TYLENOL) 650 MG  CR tablet Take 650 mg by mouth every 8 (eight) hours as needed for pain.   amiodarone (PACERONE) 200 MG tablet TAKE 2 TABLETS BY MOUTH TWICE DAILY FOR 2 WEEKS, THEN 2 TABLETS EVERY DAY   aspirin 81 MG EC tablet Take 1 tablet by mouth daily.   atorvastatin (LIPITOR) 40 MG tablet Take 1 tablet (40 mg total) by mouth daily.   cephALEXin (KEFLEX) 500 MG capsule Take 1 capsule (500 mg total) by mouth 4 (four) times daily.   clopidogrel (PLAVIX) 75 MG tablet Take 1 tablet (75 mg total) by mouth daily.   furosemide (LASIX) 40 MG tablet Take 1 tablet (40 mg total) by  mouth every other day. Please upcoming appt. In Feb. With Cardiologist in order to receive further refills. Thank You.   isosorbide mononitrate (IMDUR) 30 MG 24 hr tablet Take 1 tablet (30 mg total) by mouth daily. Please upcoming appt. In Feb. With Cardiologist in order to receive further refills. Thank You.   levothyroxine (SYNTHROID) 88 MCG tablet Take 1 tablet (88 mcg total) by mouth daily before breakfast.   loratadine (CLARITIN) 10 MG tablet Take 10 mg by mouth daily as needed for allergies.   metoprolol succinate (TOPROL-XL) 25 MG 24 hr tablet TAKE 1 TABLET(25 MG) BY MOUTH AT BEDTIME   nitroGLYCERIN (NITROSTAT) 0.4 MG SL tablet Place 0.4 mg under the tongue every 5 (five) minutes as needed for chest pain.   OVER THE COUNTER MEDICATION Place 1 spray into both nostrils as needed (nasal congestion). Xytiol   ranolazine (RANEXA) 500 MG 12 hr tablet Take 1 tablet (500 mg total) by mouth 2 (two) times daily. Please upcoming appt. In Feb. With Cardiologist in order to receive further refills. Thank You.   spironolactone (ALDACTONE) 25 MG tablet Take 0.5 tablets by mouth daily.     Allergies:    Patient has no known allergies.   Social History:   Social History   Tobacco Use   Smoking status: Former    Types: Cigarettes    Quit date: 1970    Years since quitting: 53.1   Smokeless tobacco: Never  Vaping Use   Vaping Use: Never used  Substance Use Topics   Alcohol use: Not Currently   Drug use: Never     Family Hx: Family History  Problem Relation Age of Onset   Dementia Mother    Heart Problems Mother    Heart Problems Father      Review of Systems:   Please see the history of present illness.    All other systems reviewed and are negative.     EKGs/Labs/Other Test Reviewed:    EKG: V-paced  Prior CV studies:  TTE 1/23  1. Left ventricular ejection fraction, by estimation, is 40 to 45%. The  left ventricle has mildly decreased function. The left ventricle   demonstrates global hypokinesis.   Cath 1/23 Severe native vessel coronary disease with total occlusion of the proximal and mid RCA, mid LAD, and moderate diffuse disease in the first diagonal.  The dominant obtuse marginal branch contains 50% stenosis. Patent LIMA to mid/distal LAD is widely patent but with diffuse disease both antegrade and retrograde to graft insertion site. Total occlusion of saphenous vein graft to distal RCA. Normal LVEDP, 11 mmHg.    Imaging studies that I have independently reviewed today: TTE, cath Recent Labs: 01/05/2022: B Natriuretic Peptide 217.4 01/10/2022: Magnesium 2.0 01/27/2022: ALT 22; BUN 12; Creatinine, Ser 1.08; Hemoglobin 14.7; Platelets 269; Potassium 4.6; Sodium  140; TSH 0.116   Recent Lipid Panel Lab Results  Component Value Date/Time   CHOL 137 01/27/2022 09:13 AM   TRIG 216 (H) 01/27/2022 09:13 AM   HDL 44 01/27/2022 09:13 AM   LDLCALC 58 01/27/2022 09:13 AM    Risk Assessment/Calculations:          Physical Exam:    VS:  BP 90/60 (BP Location: Left Arm, Patient Position: Sitting, Cuff Size: Normal)    Pulse 68    Ht 6\' 2"  (1.88 m)    Wt 276 lb (125.2 kg)    BMI 35.44 kg/m    Wt Readings from Last 3 Encounters:  02/22/22 276 lb (125.2 kg)  01/27/22 276 lb (125.2 kg)  01/22/22 273 lb 12.8 oz (124.2 kg)    GENERAL:  No apparent distress, AOx3 HEENT:  No carotid bruits, +2 carotid impulses, no scleral icterus; JVD not elevated CAR: RRR no murmurs, gallops, rubs, or thrills RES:  Clear to auscultation bilaterally ABD:  Soft, nontender, nondistended, positive bowel sounds x 4 VASC:  +2 radial pulses, +2 carotid pulses, palpable pedal pulses NEURO:  CN 2-12 grossly intact; motor and sensory grossly intact PSYCH:  No active depression or anxiety EXT:  +1 edema, without ecchymosis, or cyanosis  Signed, Orbie Pyo, MD  02/22/2022 3:02 PM    [facility] [address] [place], [place], [place]  [address] Phone:  [phone]; Fax: [phone]   Note:  This document was prepared using Dragon voice recognition software and may include unintentional dictation errors.

## 2022-02-22 NOTE — Addendum Note (Signed)
Addended by: Devra Dopp E on: 02/22/2022 03:37 PM   Modules accepted: Orders

## 2022-02-23 ENCOUNTER — Telehealth: Payer: Self-pay | Admitting: Internal Medicine

## 2022-02-23 NOTE — Telephone Encounter (Signed)
New Message:      Wife called and wanted to know if there is another medicine that patient can rake in the place of Jardiance. She  says it is too expensive.  Pt c/o medication issue:  1. Name of Medication: Jardiance  2. How are you currently taking this medication (dosage and times per day)?   3. Are you having a reaction (difficulty breathing--STAT)?   4. What is your medication issue? Too expensive

## 2022-02-24 NOTE — Telephone Encounter (Signed)
Leaving pt a 14 day copay card and 4 boxes of Jardiance 10 mg tablets, a month supply, at the front desk for pt to pick up.   Lot# [id number]  Exp: 6/24. Thanks  ?

## 2022-02-24 NOTE — Telephone Encounter (Signed)
Spoke w Bryan Miller.  The [place] Pepper is $45 for 30 day supply.  She picked up one month.  The patient was not provided samples or 14 day trial card so I will ask for these to be placed at front desk for them.  We discussed the possibility of patient assistance and she will download forms for this as well.    ? ?In addition, she will check with insurance to see if Marcelline Deist has a lower copay. ?

## 2022-02-24 NOTE — Telephone Encounter (Signed)
Left message for patient or Bryan Miller to call back. ?

## 2022-03-03 ENCOUNTER — Telehealth: Payer: Self-pay

## 2022-03-03 NOTE — Telephone Encounter (Signed)
**Note De-Identified Aziya Arena Obfuscation** We received a Ranolazine PA request from [facility]. ?I did do this PA and received the following message from Covermymeds: ? ?Stephano Elisabeth Most Key: [id number] ?Outcome: Available without authorization. ?Drug: Ranolazine ER 500MG  er tablets ?Form: PA Form ?

## 2022-03-12 ENCOUNTER — Other Ambulatory Visit: Payer: Self-pay | Admitting: Physician Assistant

## 2022-03-15 ENCOUNTER — Telehealth: Payer: Self-pay | Admitting: Internal Medicine

## 2022-03-15 NOTE — Telephone Encounter (Signed)
New Message: ? ? ? ?She wants to know if the patient is taking the right Metoprolol and if it time released or not?   The correct one would you please call in for him please. ? ? ? ? ?*STAT* If patient is at the pharmacy, call can be transferred to refill team. ? ? ?1. Which medications need to be refilled? (please list name of each medication and dose if known) Metoprolol ?? ? ?2. Which pharmacy/location (including street and city if local pharmacy) is medication to be sent to? [facility] [place] ? ?3. Do they need a 30 day or 90 day supply? 90 days and refills ? ?

## 2022-03-15 NOTE — Telephone Encounter (Signed)
Spoke with pt's wife, DPR and advised Metoprolol Succinate is the long acting form of Metoprolol and Rx was sent to pharmacy on 02/08/2022 #90 with 3 RF by Ronie Spies.  Pt should have refills available.  Pt's wife verbalizes understanding and thanked Charity fundraiser for the call. ?

## 2022-03-17 ENCOUNTER — Other Ambulatory Visit: Payer: Self-pay | Admitting: Physician Assistant

## 2022-03-17 ENCOUNTER — Telehealth: Payer: Self-pay

## 2022-03-17 DIAGNOSIS — E038 Other specified hypothyroidism: Secondary | ICD-10-CM

## 2022-03-17 NOTE — Telephone Encounter (Signed)
Pt's pharmacy is requesting a refill on levothyroxine. Would Ronie Spies, PA, like to refill this medication? Please address ?

## 2022-03-17 NOTE — Telephone Encounter (Signed)
Attempted phone call to pt to discuss return to work papers received from [employer].  Left voicemail message to contact RN at [phone]. ?

## 2022-04-15 ENCOUNTER — Ambulatory Visit: Payer: Medicare HMO | Admitting: Internal Medicine

## 2022-04-15 ENCOUNTER — Encounter: Payer: Self-pay | Admitting: Internal Medicine

## 2022-04-15 VITALS — BP 90/60 | HR 61 | Ht 74.0 in | Wt 273.0 lb

## 2022-04-15 DIAGNOSIS — I5022 Chronic systolic (congestive) heart failure: Secondary | ICD-10-CM | POA: Diagnosis not present

## 2022-04-15 DIAGNOSIS — I472 Ventricular tachycardia, unspecified: Secondary | ICD-10-CM | POA: Diagnosis not present

## 2022-04-15 DIAGNOSIS — Z95810 Presence of automatic (implantable) cardiac defibrillator: Secondary | ICD-10-CM

## 2022-04-15 NOTE — Progress Notes (Signed)
? ? ? ? ?Patient Care Team: ?Lillard Anes, MD as PCP - General (Family Medicine) ? ? ?HPI ? ?Bryan Miller is a 71 y.o. male seen in follow-up for ventricular tachycardia in the context of ischemic heart disease and prior bypass surgery with remotely implanted CRT-D ? ?Amio started and ranolazine added for AIVR  ? ?Feeling better with modest dyspnea on exertion but much improved.  Some lightheadedness with standing.  No chest pain.  No palpitations.  His wife says he is much better.  TSH was low in February and his levothyroxine was down titrated ? ?DATE TEST EF   ?9/21 Echo   35-40 %   ?1/23 Echo  40-45%   ?1/23 LHC  LIMA-LAD p; SVG-RCA-100; OM 50%  ? ?Date Cr K Hgb TSH LFTs  ?2/23 1.08 4.6 14.7 0.116 (levothyroxine dose decreased) 22  ?        ? ? ? ?Records and Results Reviewed  ? ?Past Medical History:  ?Diagnosis Date  ? CAD (coronary artery disease)   ? Chronic kidney disease, stage 3a ([place])   ? Chronic systolic CHF (congestive heart failure) ([place])   ? Dilated aortic root ([place]) 12/05/2018  ? Hyperlipidemia 08/15/2020  ? Hypothyroidism   ? Left bundle branch block 09/14/2019  ? Old myocardial infarction 06/26/2011  ? Prediabetes 05/05/2010  ? Presence of cardiac defibrillator   ? S/P CABG (coronary artery bypass graft)   ? Ventricular tachycardia, monomorphic ([place])   ? ? ?Past Surgical History:  ?Procedure Laterality Date  ? ABDOMINAL HERNIA REPAIR  2020  ? bypass  2001  ? CHOLECYSTECTOMY  1996  ? 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  ? LEFT HEART CATH AND CORS/GRAFTS ANGIOGRAPHY N/A 01/07/2022  ? Procedure: LEFT HEART CATH AND CORS/GRAFTS ANGIOGRAPHY;  Surgeon: Belva Crome, MD;  Location: [facility];  Service: Cardiovascular;  Laterality: N/A;  ? ? ?Current Meds  ?Medication Sig  ? acetaminophen (TYLENOL) 650 MG CR tablet Take 650 mg by mouth every 8 (eight) hours as needed for pain.  ? amiodarone (PACERONE) 200 MG tablet TAKE 2 TABLETS BY MOUTH TWICE DAILY FOR 2 WEEKS, THEN 2  TABLETS EVERY DAY  ? aspirin 81 MG EC tablet Take 1 tablet by mouth daily.  ? atorvastatin (LIPITOR) 40 MG tablet Take 1 tablet (40 mg total) by mouth daily.  ? cephALEXin (KEFLEX) 500 MG capsule Take 1 capsule (500 mg total) by mouth 4 (four) times daily.  ? clopidogrel (PLAVIX) 75 MG tablet Take 1 tablet (75 mg total) by mouth daily.  ? empagliflozin (JARDIANCE) 10 MG TABS tablet Take 1 tablet (10 mg total) by mouth daily before breakfast.  ? furosemide (LASIX) 40 MG tablet Take 1 tablet (40 mg total) by mouth every other day. Please upcoming appt. In Feb. With Cardiologist in order to receive further refills. Thank You.  ? levothyroxine (SYNTHROID) 88 MCG tablet Take 1 tablet (88 mcg total) by mouth daily before breakfast.  ? loratadine (CLARITIN) 10 MG tablet Take 10 mg by mouth daily as needed for allergies.  ? losartan (COZAAR) 25 MG tablet Take 0.5 tablets (12.5 mg total) by mouth at bedtime.  ? metoprolol succinate (TOPROL-XL) 25 MG 24 hr tablet TAKE 1 TABLET(25 MG) BY MOUTH AT BEDTIME  ? nitroGLYCERIN (NITROSTAT) 0.4 MG SL tablet Place 0.4 mg under the tongue every 5 (five) minutes as needed for chest pain.  ? OVER THE COUNTER MEDICATION Place 1 spray into both nostrils as needed (  nasal congestion). Xytiol  ? ranolazine (RANEXA) 500 MG 12 hr tablet Take 1 tablet (500 mg total) by mouth 2 (two) times daily. Please upcoming appt. In Feb. With Cardiologist in order to receive further refills. Thank You.  ? spironolactone (ALDACTONE) 25 MG tablet Take 0.5 tablets by mouth at bedtime.  ? ? ?No Known Allergies ? ? ? ?Review of Systems negative except from HPI and PMH ? ?Physical Exam ?BP 90/60 (BP Location: Left Arm, Patient Position: Sitting, Cuff Size: Normal)   Pulse 61   Ht 6\' 2"  (1.88 m)   Wt 273 lb (123.8 kg)   SpO2 96%   BMI 35.05 kg/m?  ?Well developed and well nourished in no acute distress ?HENT normal ?E scleral and icterus clear ?Neck Supple ?JVP flat; carotids brisk and full ?Clear to  ausculation ?Regular rate and rhythm, no murmurs gallops or rub ?Soft with active bowel sounds ?No clubbing cyanosis  Edema ?Alert and oriented, grossly normal motor and sensory function ?Skin Warm and Dry ? ?ECG sinus with P synchronous pacing at 61 ?Intervals 08/11/1954 ?Upright QRS lead V1 and negative QRS lead I ? ?CrCl cannot be calculated (Patient's most recent lab result is older than the maximum 21 days allowed.). ? ? ?Assessment and  Plan ? ?Ventricular tachycardia-monomorphic-recurrent treated with ATP ?  ?Presyncope with the above ?  ?Coronary artery disease with prior bypass ?  ?Aborted cardiac arrest ?  ?CRT-D-[employer]  ?  ?Systolic heart failure-chronic-systolic ? ?High Risk Medication Surveillance-amiodarone ? ?Mildly volume overloaded.  We will increase his furosemide from 20 daily to 40 daily x3 days and then 40 every other day.  He is supposed to get labs next week so we will follow-up on his amiodarone and his hypothyroidism. ? ?With his cardiomyopathy we will continue Toprol 25 and losartan 12.5 spironolactone 12.5.  His blood pressure is low but he is not having significant orthostatic lightheadedness. ? ?No interval ventricular tachycardia no histogram evidence of AIVR.  We will continue the amiodarone 200 mg a day and ranolazine 500 twice daily ? ? ?Current medicines are reviewed at length with the patient today .  The patient does not  have concerns regarding medicines. ? ?

## 2022-04-15 NOTE — Patient Instructions (Signed)
Medication Instructions:  ?Your physician has recommended you make the following change in your medication:  ? ?Take Furosemide 40mg  - 1 tablet by mouth daily x 3 days then decrease to Furosemide 40mg  - 1 tablet by mouth every other day. ?*If you need a refill on your cardiac medications before your next appointment, please call your pharmacy* ? ? ?Lab Work: ?TSH and Liver Panel with at your PCP visit. ?If you have labs (blood work) drawn today and your tests are completely normal, you will receive your results only by: ?MyChart Message (if you have MyChart) OR ?A paper copy in the mail ?If you have any lab test that is abnormal or we need to change your treatment, we will call you to review the results. ? ? ?Testing/Procedures: ?None ordered. ? ? ? ?Follow-Up: ?At [facility], you and your health needs are our priority.  As part of our continuing mission to provide you with exceptional heart care, we have created designated Provider Care Teams.  These Care Teams include your primary Cardiologist (physician) and Advanced Practice Providers (APPs -  Physician Assistants and Nurse Practitioners) who all work together to provide you with the care you need, when you need it. ? ?We recommend signing up for the patient portal called "MyChart".  Sign up information is provided on this After Visit Summary.  MyChart is used to connect with patients for Virtual Visits (Telemedicine).  Patients are able to view lab/test results, encounter notes, upcoming appointments, etc.  Non-urgent messages can be sent to your provider as well.   ?To learn more about what you can do with MyChart, go to .   ? ?Your next appointment:   ?6 months with Dr [facility] ? ?Important Information About Sugar ? ? ? ? ?  ?

## 2022-04-16 ENCOUNTER — Ambulatory Visit: Payer: Medicare HMO | Admitting: Internal Medicine

## 2022-04-25 ENCOUNTER — Other Ambulatory Visit: Payer: Self-pay | Admitting: Legal Medicine

## 2022-04-27 ENCOUNTER — Ambulatory Visit (INDEPENDENT_AMBULATORY_CARE_PROVIDER_SITE_OTHER): Payer: Medicare HMO

## 2022-04-27 DIAGNOSIS — I472 Ventricular tachycardia, unspecified: Secondary | ICD-10-CM | POA: Diagnosis not present

## 2022-04-27 NOTE — Progress Notes (Signed)
? ?Subjective:  ?Patient ID: Bryan Miller, male    DOB: [birth year]  Age: 71 y.o. MRN: [id number] ? ?Chief Complaint  ?Patient presents with  ? Prediabetes  ? Hypothyroidism  ? Hyperlipidemia  ? ? ?HPI: chronic visit ? ?Patient presents with hyperlipidemia.  Compliance with treatment has been good; patient takes medicines as directed, maintains low cholesterol diet, follows up as directed, and maintains exercise regimen.  Patient is using Atorvastatin 40 mg daily without problems.   ? ?Hypothyroidism: Patient takes Levothyroxine 88 mcg daily. ?Patient has HYPOTHYROIDISM.  Diagnosed 20 years ago.  Patient has stable thyroid readings.  Patient is having no symptoms.  Last TSH was normal.  continue dosage of thyroid medicine.  ? ?Prediabetes: He takes Jardiance 10 mg daily. BS good ? ?He sees Dr. Graciela Husbands, no v-tachy, he had bypass surgery and ischemic heart disease ? ? ?Current Outpatient Medications on File Prior to Visit  ?Medication Sig Dispense Refill  ? acetaminophen (TYLENOL) 650 MG CR tablet Take 650 mg by mouth every 8 (eight) hours as needed for pain.    ? amiodarone (PACERONE) 200 MG tablet TAKE 2 TABLETS BY MOUTH TWICE DAILY FOR 2 WEEKS, THEN 2 TABLETS EVERY DAY 300 tablet 0  ? aspirin 81 MG EC tablet Take 1 tablet by mouth daily.    ? atorvastatin (LIPITOR) 40 MG tablet TAKE 1 TABLET(40 MG) BY MOUTH DAILY 90 tablet 2  ? clopidogrel (PLAVIX) 75 MG tablet Take 1 tablet (75 mg total) by mouth daily. 90 tablet 2  ? empagliflozin (JARDIANCE) 10 MG TABS tablet Take 1 tablet (10 mg total) by mouth daily before breakfast. 90 tablet 3  ? furosemide (LASIX) 40 MG tablet Take 1 tablet (40 mg total) by mouth every other day. Please upcoming appt. In Feb. With Cardiologist in order to receive further refills. Thank You. 45 tablet 0  ? loratadine (CLARITIN) 10 MG tablet Take 10 mg by mouth daily as needed for allergies.    ? losartan (COZAAR) 25 MG tablet Take 0.5 tablets (12.5 mg total) by mouth at bedtime. 45 tablet 3   ? metoprolol succinate (TOPROL-XL) 25 MG 24 hr tablet TAKE 1 TABLET(25 MG) BY MOUTH AT BEDTIME 90 tablet 3  ? nitroGLYCERIN (NITROSTAT) 0.4 MG SL tablet Place 0.4 mg under the tongue every 5 (five) minutes as needed for chest pain.    ? OVER THE COUNTER MEDICATION Place 1 spray into both nostrils as needed (nasal congestion). Xytiol    ? ranolazine (RANEXA) 500 MG 12 hr tablet Take 1 tablet (500 mg total) by mouth 2 (two) times daily. Please upcoming appt. In Feb. With Cardiologist in order to receive further refills. Thank You. 180 tablet 0  ? spironolactone (ALDACTONE) 25 MG tablet Take 0.5 tablets by mouth at bedtime.    ? ?No current facility-administered medications on file prior to visit.  ? ?Past Medical History:  ?Diagnosis Date  ? CAD (coronary artery disease)   ? Chronic kidney disease, stage 3a (H[facility])   ? Chronic systolic CHF (congestive heart failure) ([facility])   ? Dilated aortic root ([facility]) 12/05/2018  ? Hyperlipidemia 08/15/2020  ? Hypothyroidism   ? Left bundle branch block 09/14/2019  ? Old myocardial infarction 06/26/2011  ? Prediabetes 05/05/2010  ? Presence of cardiac defibrillator   ? S/P CABG (coronary artery bypass graft)   ? Ventricular tachycardia, monomorphic ([facility])   ? ?Past Surgical History:  ?Procedure Laterality Date  ? ABDOMINAL HERNIA REPAIR  2020  ? bypass  2001  ? CHOLECYSTECTOMY  1996  ? 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  ? LEFT HEART CATH AND CORS/GRAFTS ANGIOGRAPHY N/A 01/07/2022  ? Procedure: LEFT HEART CATH AND CORS/GRAFTS ANGIOGRAPHY;  Surgeon: Lyn Records, MD;  Location: [facility];  Service: Cardiovascular;  Laterality: N/A;  ?  ?Family History  ?Problem Relation Age of Onset  ? Dementia Mother   ? Heart Problems Mother   ? Heart Problems Father   ? ?Social History  ? ?Socioeconomic History  ? Marital status: Married  ?  Spouse name: Not on file  ? Number of children: 3  ? Years of education: Not on file  ? Highest education level: Not on file  ?Occupational  History  ? Not on file  ?Tobacco Use  ? Smoking status: Former  ?  Types: Cigarettes  ?  Quit date: 1970  ?  Years since quitting: 53.3  ? Smokeless tobacco: Never  ?Vaping Use  ? Vaping Use: Never used  ?Substance and Sexual Activity  ? Alcohol use: Not Currently  ? Drug use: Never  ? Sexual activity: Yes  ?  Partners: Female  ?Other Topics Concern  ? Not on file  ?Social History Narrative  ? Retired Runner, broadcasting/film/video.  Lives with wife.   ? ?Social Determinants of Health  ? ?Financial Resource Strain: Not on file  ?Food Insecurity: Not on file  ?Transportation Needs: Not on file  ?Physical Activity: Not on file  ?Stress: Not on file  ?Social Connections: Not on file  ? ? ?Review of Systems  ?Constitutional:  Negative for chills, fatigue, fever and unexpected weight change.  ?HENT:  Negative for congestion, ear pain, sinus pain and sore throat.   ?Eyes:  Negative for visual disturbance.  ?Respiratory:  Negative for cough and shortness of breath.   ?Cardiovascular:  Negative for chest pain and palpitations.  ?Gastrointestinal:  Negative for abdominal pain, blood in stool, constipation, diarrhea, nausea and vomiting.  ?Endocrine: Negative for polydipsia.  ?Genitourinary:  Negative for dysuria.  ?Musculoskeletal:  Negative for back pain.  ?Skin:  Negative for rash.  ?Neurological: Negative.  Negative for headaches.  ?Hematological: Negative.   ?Psychiatric/Behavioral: Negative.    ? ? ?Objective:  ?BP 94/60   Pulse 60   Temp 97.7 ?F (36.5 ?C)   Ht 6\' 2"  (1.88 m)   Wt 268 lb (121.6 kg)   SpO2 93%   BMI 34.41 kg/m?  ? ? ?  04/28/2022  ?  9:46 AM 04/15/2022  ?  1:45 PM 02/22/2022  ?  2:29 PM  ?BP/Weight  ?Systolic BP 94 90 90  ?Diastolic BP 60 60 60  ?Wt. (Lbs) 268 273 276  ?BMI 34.41 kg/m2 35.05 kg/m2 35.44 kg/m2  ? ? ?Physical Exam ?Vitals reviewed.  ?Constitutional:   ?   General: He is not in acute distress. ?   Appearance: Normal appearance.  ?HENT:  ?   Head: Normocephalic and atraumatic.  ?   Right Ear: Tympanic membrane  normal.  ?   Left Ear: Tympanic membrane normal.  ?   Nose: Nose normal.  ?   Mouth/Throat:  ?   Mouth: Mucous membranes are moist.  ?Eyes:  ?   Extraocular Movements: Extraocular movements intact.  ?   Conjunctiva/sclera: Conjunctivae normal.  ?   Pupils: Pupils are equal, round, and reactive to light.  ?Cardiovascular:  ?   Rate and Rhythm: Normal rate and regular rhythm.  ?   Pulses: Normal pulses.  ?  Heart sounds: Normal heart sounds. No murmur heard. ?  No gallop.  ?Pulmonary:  ?   Effort: Pulmonary effort is normal. No respiratory distress.  ?   Breath sounds: Normal breath sounds. No wheezing.  ?Abdominal:  ?   General: Abdomen is flat. Bowel sounds are normal. There is no distension.  ?   Palpations: Abdomen is soft.  ?   Tenderness: There is no abdominal tenderness.  ?Musculoskeletal:     ?   General: Normal range of motion.  ?   Cervical back: Normal range of motion.  ?   Right lower leg: No edema.  ?   Left lower leg: No edema.  ?Skin: ?   General: Skin is warm.  ?   Capillary Refill: Capillary refill takes less than 2 seconds.  ?Neurological:  ?   General: No focal deficit present.  ?   Mental Status: He is alert and oriented to person, place, and time. Mental status is at baseline.  ?   Gait: Gait normal.  ?   Deep Tendon Reflexes: Reflexes normal.  ?Psychiatric:     ?   Mood and Affect: Mood normal.     ?   Thought Content: Thought content normal.     ?   Judgment: Judgment normal.  ? ? ? ?  ? ?Lab Results  ?Component Value Date  ? WBC 9.1 01/27/2022  ? HGB 14.7 01/27/2022  ? HCT 43.1 01/27/2022  ? PLT 269 01/27/2022  ? GLUCOSE 94 01/27/2022  ? CHOL 137 01/27/2022  ? TRIG 216 (H) 01/27/2022  ? HDL 44 01/27/2022  ? LDLCALC 58 01/27/2022  ? ALT 22 01/27/2022  ? AST 24 01/27/2022  ? NA 140 01/27/2022  ? K 4.6 01/27/2022  ? CL 103 01/27/2022  ? CREATININE 1.08 01/27/2022  ? BUN 12 01/27/2022  ? CO2 23 01/27/2022  ? TSH 0.116 (L) 01/27/2022  ? HGBA1C 6.0 (H) 01/27/2022  ? ? ? ? ?Assessment & Plan:   ? ?Problem List Items Addressed This Visit   ? ?  ? Cardiovascular and Mediastinum  ? Atherosclerotic heart disease of native coronary artery without angina pectoris - Primary  ? Ischemic dilated cardiomyopathy (HCC)

## 2022-04-28 ENCOUNTER — Ambulatory Visit (INDEPENDENT_AMBULATORY_CARE_PROVIDER_SITE_OTHER): Payer: Medicare HMO | Admitting: Legal Medicine

## 2022-04-28 ENCOUNTER — Encounter: Payer: Self-pay | Admitting: Legal Medicine

## 2022-04-28 VITALS — BP 94/60 | HR 60 | Temp 97.7°F | Ht 74.0 in | Wt 268.0 lb

## 2022-04-28 DIAGNOSIS — I2510 Atherosclerotic heart disease of native coronary artery without angina pectoris: Secondary | ICD-10-CM | POA: Diagnosis not present

## 2022-04-28 DIAGNOSIS — N1831 Chronic kidney disease, stage 3a: Secondary | ICD-10-CM

## 2022-04-28 DIAGNOSIS — E038 Other specified hypothyroidism: Secondary | ICD-10-CM

## 2022-04-28 DIAGNOSIS — E782 Mixed hyperlipidemia: Secondary | ICD-10-CM

## 2022-04-28 DIAGNOSIS — E0590 Thyrotoxicosis, unspecified without thyrotoxic crisis or storm: Secondary | ICD-10-CM | POA: Diagnosis not present

## 2022-04-28 DIAGNOSIS — I420 Dilated cardiomyopathy: Secondary | ICD-10-CM

## 2022-04-28 DIAGNOSIS — R7303 Prediabetes: Secondary | ICD-10-CM

## 2022-04-28 DIAGNOSIS — I255 Ischemic cardiomyopathy: Secondary | ICD-10-CM | POA: Diagnosis not present

## 2022-04-28 MED ORDER — LEVOTHYROXINE SODIUM 88 MCG PO TABS: 88.0000 ug | ORAL_TABLET | Freq: Every day | ORAL | 1 refills | Status: DC

## 2022-04-29 LAB — COMPREHENSIVE METABOLIC PANEL
ALT: 19 IU/L (ref 0–44)
AST: 21 IU/L (ref 0–40)
Albumin/Globulin Ratio: 1.4 (ref 1.2–2.2)
Albumin: 4.1 g/dL (ref 3.7–4.7)
Alkaline Phosphatase: 109 IU/L (ref 44–121)
BUN/Creatinine Ratio: 12 (ref 10–24)
BUN: 13 mg/dL (ref 8–27)
Bilirubin Total: 0.9 mg/dL (ref 0.0–1.2)
CO2: 25 mmol/L (ref 20–29)
Calcium: 9.5 mg/dL (ref 8.6–10.2)
Chloride: 103 mmol/L (ref 96–106)
Creatinine, Ser: 1.09 mg/dL (ref 0.76–1.27)
Globulin, Total: 2.9 g/dL (ref 1.5–4.5)
Glucose: 89 mg/dL (ref 70–99)
Potassium: 4.5 mmol/L (ref 3.5–5.2)
Sodium: 141 mmol/L (ref 134–144)
Total Protein: 7 g/dL (ref 6.0–8.5)
eGFR: 73 mL/min/{1.73_m2} (ref >59)

## 2022-04-29 LAB — CUP PACEART REMOTE DEVICE CHECK
Battery Remaining Longevity: 84 mo
Battery Remaining Percentage: 100 %
Brady Statistic RA Percent Paced: 22 %
Brady Statistic RV Percent Paced: 99 %
Date Time Interrogation Session: 20230502004100
HighPow Impedance: 70 Ohm
Implantable Pulse Generator Implant Date: 20181022
Lead Channel Impedance Value: 437 Ohm
Lead Channel Impedance Value: 614 Ohm
Lead Channel Impedance Value: 960 Ohm
Lead Channel Pacing Threshold Amplitude: 0.6 V
Lead Channel Pacing Threshold Amplitude: 0.8 V
Lead Channel Pacing Threshold Amplitude: 1.5 V
Lead Channel Pacing Threshold Pulse Width: 0.4 ms
Lead Channel Pacing Threshold Pulse Width: 0.4 ms
Lead Channel Pacing Threshold Pulse Width: 0.4 ms
Lead Channel Setting Pacing Amplitude: 2 V
Lead Channel Setting Pacing Amplitude: 2 V
Lead Channel Setting Pacing Amplitude: 2.6 V
Lead Channel Setting Pacing Pulse Width: 0.4 ms
Lead Channel Setting Pacing Pulse Width: 0.4 ms
Lead Channel Setting Sensing Sensitivity: 0.6 mV
Lead Channel Setting Sensing Sensitivity: 1 mV
Pulse Gen Serial Number: 181086

## 2022-04-29 LAB — LIPID PANEL
Chol/HDL Ratio: 3.4 ratio (ref 0.0–5.0)
Cholesterol, Total: 141 mg/dL (ref 100–199)
HDL: 42 mg/dL (ref >39)
LDL Chol Calc (NIH): 66 mg/dL (ref 0–99)
Triglycerides: 197 mg/dL — ABNORMAL HIGH (ref 0–149)
VLDL Cholesterol Cal: 33 mg/dL (ref 5–40)

## 2022-04-29 LAB — MICROALBUMIN / CREATININE URINE RATIO
Creatinine, Urine: 104.5 mg/dL
Microalb/Creat Ratio: <3 mg/g creat (ref 0–29)
Microalbumin, Urine: <3 ug/mL

## 2022-04-29 LAB — CBC WITH DIFFERENTIAL/PLATELET
Basophils Absolute: 0 10*3/uL (ref 0.0–0.2)
Basos: 1 %
EOS (ABSOLUTE): 0.6 10*3/uL — ABNORMAL HIGH (ref 0.0–0.4)
Eos: 8 %
Hematocrit: 47.3 % (ref 37.5–51.0)
Hemoglobin: 15.6 g/dL (ref 13.0–17.7)
Immature Grans (Abs): 0 10*3/uL (ref 0.0–0.1)
Immature Granulocytes: 0 %
Lymphocytes Absolute: 2.6 10*3/uL (ref 0.7–3.1)
Lymphs: 35 %
MCH: 31.1 pg (ref 26.6–33.0)
MCHC: 33 g/dL (ref 31.5–35.7)
MCV: 94 fL (ref 79–97)
Monocytes Absolute: 0.8 10*3/uL (ref 0.1–0.9)
Monocytes: 10 %
Neutrophils Absolute: 3.4 10*3/uL (ref 1.4–7.0)
Neutrophils: 46 %
Platelets: 222 10*3/uL (ref 150–450)
RBC: 5.01 x10E6/uL (ref 4.14–5.80)
RDW: 12.8 % (ref 11.6–15.4)
WBC: 7.4 10*3/uL (ref 3.4–10.8)

## 2022-04-29 LAB — CARDIOVASCULAR RISK ASSESSMENT

## 2022-04-29 LAB — HEMOGLOBIN A1C
Est. average glucose Bld gHb Est-mCnc: 117 mg/dL
Hgb A1c MFr Bld: 5.7 % — ABNORMAL HIGH (ref 4.8–5.6)

## 2022-04-29 LAB — TSH: TSH: 3.24 u[IU]/mL (ref 0.450–4.500)

## 2022-04-29 NOTE — Progress Notes (Signed)
A1c 5.7, triglycerides 197 high, watch diet, CBC normal, Microalbuminuria normal, Kidney and liver tests normal, TSH 3.24 normal ?lp

## 2022-05-03 ENCOUNTER — Ambulatory Visit: Payer: Medicare HMO | Admitting: Internal Medicine

## 2022-05-03 ENCOUNTER — Encounter: Payer: Self-pay | Admitting: Internal Medicine

## 2022-05-03 VITALS — BP 100/66 | HR 63 | Ht 74.0 in | Wt 273.6 lb

## 2022-05-03 DIAGNOSIS — I10 Essential (primary) hypertension: Secondary | ICD-10-CM | POA: Diagnosis not present

## 2022-05-03 DIAGNOSIS — I255 Ischemic cardiomyopathy: Secondary | ICD-10-CM | POA: Diagnosis not present

## 2022-05-03 DIAGNOSIS — Z951 Presence of aortocoronary bypass graft: Secondary | ICD-10-CM | POA: Diagnosis not present

## 2022-05-03 DIAGNOSIS — I509 Heart failure, unspecified: Secondary | ICD-10-CM | POA: Diagnosis not present

## 2022-05-03 DIAGNOSIS — E782 Mixed hyperlipidemia: Secondary | ICD-10-CM

## 2022-05-03 DIAGNOSIS — Z95810 Presence of automatic (implantable) cardiac defibrillator: Secondary | ICD-10-CM | POA: Diagnosis not present

## 2022-05-03 NOTE — Addendum Note (Signed)
Addended by: Lendon Ka on: 05/03/2022 03:20 PM ? ? Modules accepted: Orders ? ?

## 2022-05-03 NOTE — Progress Notes (Addendum)
d ?Cardiology Office Note:   ? ?Date:  05/03/2022  ? ?ID:  Bryan Miller, DOB 04/23/1951, MRN [id number] ? ?PCP:  Abigail Miyamoto, MD  ? ?[facility] Providers ?Cardiologist:  Alverda Skeans, MD ?Referring MD: Abigail Miyamoto,*  ? ?Reason for visit:  Cardiology follow-up ? ?ASSESSMENT:   ? 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 ?Hx of CABG ? ?Hypertension, unspecified type ? ?Mixed hyperlipidemia ? ?Presence of cardiac defibrillator ? ?PLAN:   ? ?In order of problems listed above: ?1.  Ischemic cardiomyopathy: Continue current medications.  Follow-up in 9 months or earlier if needed.  The patient is euvolemic on exam.  I have instructed him to go ahead and take an extra Lasix pill if he were to need it. ?2.  History of CABG: Continue Plavix monotherapy, metoprolol, and atorvastatin. ?3.  Hypertension: Blood pressures well controlled on his current regimen. ?4.  Hyperlipidemia: Recent LDL was at goal.  We will check lipid panel, LFTs, and LP(a) in 9 months. ?5.  Status post ICD: This is being followed by EP.  Continue amiodarone and ranolazine. ? ? ?Dispo:  Return in about 9 months (around 02/03/2023).  ?  ? ?Medication Adjustments/Labs and Tests Ordered: ?Current medicines are reviewed at length with the patient today.  Concerns regarding medicines are outlined above.  ? ?Tests Ordered: ?No orders of the defined types were placed in this encounter. ? ? ?Medication Changes: ?No orders of the defined types were placed in this encounter. ? ? ?History of Present Illness:   ? ?FOCUSED CARDIOVASCULAR PROBLEM LIST:   ?1.  Coronary artery disease status post CABG 2003 consisting of a LIMA to LAD, and vein graft to RCA; coronary angiography January 2023 demonstrated vein graft to RCA was occluded ?2.  Ischemic cardiomyopathy with ejection fraction approximately 40% status post CRT-D; followed by EP ?3.  Hypertension ?4.  Hyperlipidemia ?5.  Hypothyroidism ? ?February  2023: Patient seen for initial cardiology visit.  This was to establish general cardiovascular care after being hospitalized for ventricular tachycardia.  At that time Jardiance 10 mg daily was started, Imdur was stopped due to headaches, losartan 12.5 mg at bedtime was started.  He saw EP recently and due to mild volume overload he was pulsed dosed 40 mg of Lasix for 3 days and then changed to Lasix 40 mg every other day.  Recent device interrogation was reassuring. ? ?Today: The patient is doing well.  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  He denies any significant shortness of breath, palpitations, paroxysmal nocturnal dyspnea, orthopnea.  He has had no exertional angina.  He denies any severe bleeding.  He does tire out near the end of the day at times.  Fortunately has not required any hospitalizations or emergency room visits.  He is compliant with his medications with no missed doses.  He is otherwise without complaints today. ? ? ?Current Medications: ?Current Meds  ?Medication Sig  ? acetaminophen (TYLENOL) 650 MG CR tablet Take 650 mg by mouth every 8 (eight) hours as needed for pain.  ? amiodarone (PACERONE) 200 MG tablet Take 200 mg by mouth daily.  ? aspirin 81 MG EC tablet Take 1 tablet by mouth daily.  ? atorvastatin (LIPITOR) 40 MG tablet TAKE 1 TABLET(40 MG) BY MOUTH DAILY  ? clopidogrel (PLAVIX) 75 MG tablet Take 1 tablet (75 mg total) by mouth  daily.  ? empagliflozin (JARDIANCE) 10 MG TABS tablet Take 1 tablet (10 mg total) by mouth daily before breakfast.  ? furosemide (LASIX) 40 MG tablet Take 1 tablet (40 mg total) by mouth every other day. Please upcoming appt. In Feb. With Cardiologist in order to receive further refills. Thank You.  ? levothyroxine (SYNTHROID) 88 MCG tablet Take 1 tablet (88 mcg total) by mouth daily before breakfast.  ? loratadine (CLARITIN) 10 MG tablet Take 10 mg by mouth daily as needed  for allergies.  ? losartan (COZAAR) 25 MG tablet Take 0.5 tablets (12.5 mg total) by mouth at bedtime.  ? metoprolol succinate (TOPROL-XL) 25 MG 24 hr tablet TAKE 1 TABLET(25 MG) BY MOUTH AT BEDTIME  ? nitroGLYCERIN (NITROSTAT) 0.4 MG SL tablet Place 0.4 mg under the tongue every 5 (five) minutes as needed for chest pain.  ? OVER THE COUNTER MEDICATION Place 1 spray into both nostrils as needed (nasal congestion). Xytiol  ? ranolazine (RANEXA) 500 MG 12 hr tablet Take 1 tablet (500 mg total) by mouth 2 (two) times daily. Please upcoming appt. In Feb. With Cardiologist in order to receive further refills. Thank You.  ? spironolactone (ALDACTONE) 25 MG tablet Take 0.5 tablets by mouth at bedtime.  ?  ? ?Allergies:    ?Patient has no known allergies.  ? ?Social History:   ?Social History  ? ?Tobacco Use  ? Smoking status: Former  ?  Types: Cigarettes  ?  Quit date: 1970  ?  Years since quitting: 53.3  ? Smokeless tobacco: Never  ?Vaping Use  ? Vaping Use: Never used  ?Substance Use Topics  ? Alcohol use: Not Currently  ? Drug use: Never  ?  ? ?Family Hx: ?Family History  ?Problem Relation Age of Onset  ? Dementia Mother   ? Heart Problems Mother   ? Heart Problems Father   ?  ? ?Review of Systems:   ?Please see the history of present illness.    ?All other systems reviewed and are negative. ?  ? ? ?EKGs/Labs/Other Test Reviewed:   ? ?EKG: April 2023 that I personally reviewed demonstrates A sensing and V pacing ? ?Prior CV studies: ? ?TTE 1/23 ? 1. Left ventricular ejection fraction, by estimation, is 40 to 45%. The  ?left ventricle has mildly decreased function. The left ventricle  ?demonstrates global hypokinesis.  ? ?Cath 1/23 ?Severe native vessel coronary disease with total occlusion of the proximal and mid RCA, mid LAD, and moderate diffuse disease in the first diagonal.  The dominant obtuse marginal branch contains 50% stenosis. ?Patent LIMA to mid/distal LAD is widely patent but with diffuse disease both  antegrade and retrograde to graft insertion site. ?Total occlusion of saphenous vein graft to distal RCA. ?Normal LVEDP, 11 mmHg. ? ? ? ?Imaging studies that I have independently reviewed today: TTE, cath; no CT scans available ? ?Recent Labs: ?01/05/2022: B Natriuretic Peptide 217.4 ?01/10/2022: Magnesium 2.0 ?04/28/2022: ALT 19; BUN 13; Creatinine, Ser 1.09; Hemoglobin 15.6; Platelets 222; Potassium 4.5; Sodium 141; TSH 3.240  ? ?Recent Lipid Panel ?Lab Results  ?Component Value Date/Time  ? CHOL 141 04/28/2022 10:44 AM  ? TRIG 197 (H) 04/28/2022 10:44 AM  ? HDL 42 04/28/2022 10:44 AM  ? LDLCALC 66 04/28/2022 10:44 AM  ? ? ?Risk Assessment/Calculations:   ? ? ?    ? ?Physical Exam:   ? ?VS:  BP 100/66   Pulse 63   Ht 6\' 2"  (1.88 m)   Wt 273  lb 9.6 oz (124.1 kg)   SpO2 98%   BMI 35.13 kg/m?    ?Wt Readings from Last 3 Encounters:  ?05/03/22 273 lb 9.6 oz (124.1 kg)  ?04/28/22 268 lb (121.6 kg)  ?04/15/22 273 lb (123.8 kg)  ?  ?GENERAL:  No apparent distress, AOx3 ?HEENT:  No carotid bruits, +2 carotid impulses, no scleral icterus; JVD not elevated ?CAR: RRR no murmurs, gallops, rubs, or thrills ?RES:  Clear to auscultation bilaterally ?ABD:  Soft, nontender, nondistended, positive bowel sounds x 4 ?VASC:  +2 radial pulses, +2 carotid pulses, palpable pedal pulses ?NEURO:  CN 2-12 grossly intact; motor and sensory grossly intact ?PSYCH:  No active depression or anxiety ?EXT:  +1 edema, without ecchymosis, or cyanosis ? ?Signed, ?Orbie Pyo, MD  ?05/03/2022 3:10 PM    ?[facility] ?[address] [place], [place], [place]  [address] ?Phone: [phone]; Fax: [phone]  ? ?Note:  This document was prepared using Dragon voice recognition software and may include unintentional dictation errors. ?

## 2022-05-03 NOTE — Patient Instructions (Addendum)
Medication Instructions:  ?No changes ?*If you need a refill on your cardiac medications before your next appointment, please call your pharmacy* ? ? ?Lab Work: ?Return in 9 months for lipids/liver/Lp(a)--will have done at [place] couple days before next visit. ? ?Testing/Procedures: ?none ? ? ?Follow-Up: ?At [facility], you and your health needs are our priority.  As part of our continuing mission to provide you with exceptional heart care, we have created designated Provider Care Teams.  These Care Teams include your primary Cardiologist (physician) and Advanced Practice Providers (APPs -  Physician Assistants and Nurse Practitioners) who all work together to provide you with the care you need, when you need it. ? ? ?Your next appointment:   ?9 month(s) ? ?The format for your next appointment:   ?In Person ? ?Provider:   ?Lenna Sciara, MD  ? ? ?Important Information About Sugar ? ? ? ? ?  ?

## 2022-05-12 NOTE — Progress Notes (Signed)
Remote ICD transmission.   

## 2022-05-16 ENCOUNTER — Other Ambulatory Visit: Payer: Self-pay | Admitting: Legal Medicine

## 2022-06-12 ENCOUNTER — Other Ambulatory Visit: Payer: Self-pay | Admitting: Physician Assistant

## 2022-06-24 ENCOUNTER — Encounter: Payer: Self-pay | Admitting: Nurse Practitioner

## 2022-06-24 ENCOUNTER — Ambulatory Visit (INDEPENDENT_AMBULATORY_CARE_PROVIDER_SITE_OTHER): Payer: Medicare HMO | Admitting: Nurse Practitioner

## 2022-06-24 VITALS — BP 110/78 | HR 69 | Temp 97.0°F | Ht 75.0 in | Wt 268.0 lb

## 2022-06-24 DIAGNOSIS — H6123 Impacted cerumen, bilateral: Secondary | ICD-10-CM | POA: Diagnosis not present

## 2022-06-24 NOTE — Patient Instructions (Signed)
Ear wax removed today Use Debrox ear wax drops as needed  Follow-up as needed    Earwax Buildup, Adult The ears produce a substance called earwax that helps keep bacteria out of the ear and protects the skin in the ear canal. Occasionally, earwax can build up in the ear and cause discomfort or hearing loss. What are the causes? This condition is caused by a buildup of earwax. Ear canals are self-cleaning. Ear wax is made in the outer part of the ear canal and generally falls out in small amounts over time. When the self-cleaning mechanism is not working, earwax builds up and can cause decreased hearing and discomfort. Attempting to clean ears with cotton swabs can push the earwax deep into the ear canal and cause decreased hearing and pain. What increases the risk? This condition is more likely to develop in people who: Clean their ears often with cotton swabs. Pick at their ears. Use earplugs or in-ear headphones often, or wear hearing aids. The following factors may also make you more likely to develop this condition: Being male. Being of older age. Naturally producing more earwax. Having narrow ear canals. Having earwax that is overly thick or sticky. Having excess hair in the ear canal. Having eczema. Being dehydrated. What are the signs or symptoms? Symptoms of this condition include: Reduced or muffled hearing. A feeling of fullness in the ear or feeling that the ear is plugged. Fluid coming from the ear. Ear pain or an itchy ear. Ringing in the ear. Coughing. Balance problems. An obvious piece of earwax that can be seen inside the ear canal. How is this diagnosed? This condition may be diagnosed based on: Your symptoms. Your medical history. An ear exam. During the exam, your health care provider will look into your ear with an instrument called an otoscope. You may have tests, including a hearing test. How is this treated? This condition may be treated by: Using ear  drops to soften the earwax. Having the earwax removed by a health care provider. The health care provider may: Flush the ear with water. Use an instrument that has a loop on the end (curette). Use a suction device. Having surgery to remove the wax buildup. This may be done in severe cases. Follow these instructions at home:  Take over-the-counter and prescription medicines only as told by your health care provider. Do not put any objects, including cotton swabs, into your ear. You can clean the opening of your ear canal with a washcloth or facial tissue. Follow instructions from your health care provider about cleaning your ears. Do not overclean your ears. Drink enough fluid to keep your urine pale yellow. This will help to thin the earwax. Keep all follow-up visits as told. If earwax builds up in your ears often or if you use hearing aids, consider seeing your health care provider for routine, preventive ear cleanings. Ask your health care provider how often you should schedule your cleanings. If you have hearing aids, clean them according to instructions from the manufacturer and your health care provider. Contact a health care provider if: You have ear pain. You develop a fever. You have pus or other fluid coming from your ear. You have hearing loss. You have ringing in your ears that does not go away. You feel like the room is spinning (vertigo). Your symptoms do not improve with treatment. Get help right away if: You have bleeding from the affected ear. You have severe ear pain. Summary Earwax can build  up in the ear and cause discomfort or hearing loss. The most common symptoms of this condition include reduced or muffled hearing, a feeling of fullness in the ear, or feeling that the ear is plugged. This condition may be diagnosed based on your symptoms, your medical history, and an ear exam. This condition may be treated by using ear drops to soften the earwax or by having the  earwax removed by a health care provider. Do not put any objects, including cotton swabs, into your ear. You can clean the opening of your ear canal with a washcloth or facial tissue. This information is not intended to replace advice given to you by your health care provider. Make sure you discuss any questions you have with your health care provider. Document Revised: 04/01/2020 Document Reviewed: 04/01/2020 Elsevier Patient Education  2023 Elsevier Inc.   Carbamide Peroxide Ear Solution What is this medication? CARBAMIDE PEROXIDE (CAR bah mide per OX ide) treats earwax buildup. It works by softening and loosening the earwax, which makes it easier to remove. This medicine may be used for other purposes; ask your health care provider or pharmacist if you have questions. COMMON BRAND NAME(S): Auro Ear, Auro Earache Relief, Clinere, Debrox, Ear Drops, Ear Wax Removal, Ear Wax Remover, Earwax Treatment, Murine, Thera-Ear What should I tell my care team before I take this medication? They need to know if you have any of these conditions: Dizziness Ear discharge Ear pain, irritation, or rash Infection Perforated eardrum (hole in eardrum) An unusual or allergic reaction to carbamide peroxide, glycerin, hydrogen peroxide, other medications, foods, dyes, or preservatives Pregnant or trying to get pregnant Breast-feeding How should I use this medication? This medication is only for use in the outer ear canal. Follow the directions carefully. Wash hands before and after use. The solution may be warmed by holding the bottle in the hand for 1 to 2 minutes. Lie with the affected ear facing upward. Place the proper number of drops into the ear canal. After the drops are instilled, remain lying with the affected ear upward for 5 minutes to help the drops stay in the ear canal. A cotton ball may be gently inserted at the ear opening for no longer than 5 to 10 minutes to ensure retention. Repeat, if  necessary, for the opposite ear. Do not touch the tip of the dropper to the ear, fingertips, or other surface. Do not rinse the dropper after use. Keep container tightly closed. Talk to your care team about the use of this medication in children. While this medication may be used in children as young as 12 years for selected conditions, precautions do apply. Overdosage: If you think you have taken too much of this medicine contact a poison control center or emergency room at once. NOTE: This medicine is only for you. Do not share this medicine with others. What if I miss a dose? If you miss a dose, use it as soon as you can. If it is almost time for your next dose, use only that dose. Do not use double or extra doses. What may interact with this medication? Interactions are not expected. Do not use any other ear products without asking your care team. This list may not describe all possible interactions. Give your health care provider a list of all the medicines, herbs, non-prescription drugs, or dietary supplements you use. Also tell them if you smoke, drink alcohol, or use illegal drugs. Some items may interact with your medicine. What should I  watch for while using this medication? This medication is not for long-term use. Do not use for more than 4 days without checking with your care team. Contact your care team if your condition does not start to get better within a few days or if you notice burning, redness, itching, or swelling. What side effects may I notice from receiving this medication? Side effects that you should report to your care team as soon as possible: Allergic reactions--skin rash, itching, hives, swelling of the face, lips, tongue, or throat Worsening ear pain Side effects that usually do not require medical attention (report to your care team if they continue or are bothersome): Dizziness Itching or pain in the ear after use This list may not describe all possible side  effects. Call your doctor for medical advice about side effects. You may report side effects to FDA at 1-800-FDA-1088. Where should I keep my medication? Keep out of the reach of children and pets. Store at room temperature between 15 and 30 degrees C (59 and 86 degrees F) in a tight, light-resistant container. Keep bottle away from excessive heat and direct sunlight. Throw away any unused medication after the expiration date. NOTE: This sheet is a summary. It may not cover all possible information. If you have questions about this medicine, talk to your doctor, pharmacist, or health care provider.  2023 Elsevier/Gold Standard (2022-01-13 00:00:00)

## 2022-06-24 NOTE — Progress Notes (Signed)
Acute Office Visit  Subjective:    Patient ID: Bryan Miller, male    DOB: 11/23/1951, 71 y.o.   MRN: [id number]  CC: Bilateral ear fullness  HPI: Patient is in today for evaluation of bilateral ear fullness and decreased hearing.Denies ear pain, drainage or recent URI. Reports history of excessive cerumen.   Past Medical History:  Diagnosis Date   CAD (coronary artery disease)    Chronic kidney disease, stage 3a (HCC)    Chronic systolic CHF (congestive heart failure) (HCC)    Dilated aortic root (HCC) 12/05/2018   Hyperlipidemia 08/15/2020   Hypothyroidism    Left bundle branch block 09/14/2019   Old myocardial infarction 06/26/2011   Prediabetes 05/05/2010   Presence of cardiac defibrillator    S/P CABG (coronary artery bypass graft)    Ventricular tachycardia, monomorphic (HCC)     Past Surgical History:  Procedure Laterality Date   ABDOMINAL HERNIA REPAIR  2020   bypass  2001   CHOLECYSTECTOMY  1996   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   LEFT HEART CATH AND CORS/GRAFTS ANGIOGRAPHY N/A 01/07/2022   Procedure: LEFT HEART CATH AND CORS/GRAFTS ANGIOGRAPHY;  Surgeon: Lyn Records, MD;  Location: [facility];  Service: Cardiovascular;  Laterality: N/A;    Family History  Problem Relation Age of Onset   Dementia Mother    Heart Problems Mother    Heart Problems Father     Social History   Socioeconomic History   Marital status: Married    Spouse name: Not on file   Number of children: 3   Years of education: Not on file   Highest education level: Not on file  Occupational History   Not on file  Tobacco Use   Smoking status: Former    Types: Cigarettes    Quit date: 1970    Years since quitting: 53.5   Smokeless tobacco: Never  Vaping Use   Vaping Use: Never used  Substance and Sexual Activity   Alcohol use: Not Currently   Drug use: Never   Sexual activity: Yes    Partners: Female  Other Topics Concern   Not on file  Social  History Narrative   Retired Runner, broadcasting/film/video.  Lives with wife.    Social Determinants of Health   Financial Resource Strain: Not on file  Food Insecurity: Not on file  Transportation Needs: Not on file  Physical Activity: Not on file  Stress: Not on file  Social Connections: Not on file  Intimate Partner Violence: Not on file    Outpatient Medications Prior to Visit  Medication Sig Dispense Refill   acetaminophen (TYLENOL) 650 MG CR tablet Take 650 mg by mouth every 8 (eight) hours as needed for pain.     amiodarone (PACERONE) 200 MG tablet Take 200 mg by mouth daily.     aspirin 81 MG EC tablet Take 1 tablet by mouth daily.     atorvastatin (LIPITOR) 40 MG tablet TAKE 1 TABLET(40 MG) BY MOUTH DAILY 90 tablet 2   clopidogrel (PLAVIX) 75 MG tablet TAKE 1 TABLET(75 MG) BY MOUTH DAILY 90 tablet 2   empagliflozin (JARDIANCE) 10 MG TABS tablet Take 1 tablet (10 mg total) by mouth daily before breakfast. 90 tablet 3   furosemide (LASIX) 40 MG tablet TAKE 1 TABLET BY MOUTH EVERY OTHER DAY 90 tablet 3   isosorbide mononitrate (IMDUR) 30 MG 24 hr tablet TAKE 1 TABLET BY MOUTH EVERY DAY 90 tablet 0   levothyroxine (  SYNTHROID) 88 MCG tablet Take 1 tablet (88 mcg total) by mouth daily before breakfast. 30 tablet 1   loratadine (CLARITIN) 10 MG tablet Take 10 mg by mouth daily as needed for allergies.     losartan (COZAAR) 25 MG tablet Take 0.5 tablets (12.5 mg total) by mouth at bedtime. 45 tablet 3   metoprolol succinate (TOPROL-XL) 25 MG 24 hr tablet TAKE 1 TABLET(25 MG) BY MOUTH AT BEDTIME 90 tablet 3   nitroGLYCERIN (NITROSTAT) 0.4 MG SL tablet Place 0.4 mg under the tongue every 5 (five) minutes as needed for chest pain.     OVER THE COUNTER MEDICATION Place 1 spray into both nostrils as needed (nasal congestion). Xytiol     ranolazine (RANEXA) 500 MG 12 hr tablet TAKE 1 TABLET BY MOUTH TWICE DAILY 180 tablet 3   spironolactone (ALDACTONE) 25 MG tablet Take 0.5 tablets by mouth at bedtime.     No  facility-administered medications prior to visit.    No Known Allergies  Review of Systems    See pertinent positives and negatives per HPI.  Objective:    Physical Exam Vitals reviewed.  HENT:     Right Ear: There is impacted cerumen.     Left Ear: There is impacted cerumen.  Neurological:     Mental Status: He is alert.     BP 110/78   Pulse 69   Temp (!) 97 F (36.1 C)   Ht 6\' 3"  (1.905 m)   Wt 268 lb (121.6 kg)   SpO2 98%   BMI 33.50 kg/m   Wt Readings from Last 3 Encounters:  05/03/22 273 lb 9.6 oz (124.1 kg)  04/28/22 268 lb (121.6 kg)  04/15/22 273 lb (123.8 kg)    Health Maintenance Due  Topic Date Due   Zoster Vaccines- Shingrix (1 of 2) Never done   COLONOSCOPY (Pts 45-21yrs Insurance coverage will need to be confirmed)  Never done    Lab Results  Component Value Date   TSH 3.240 04/28/2022   Lab Results  Component Value Date   WBC 7.4 04/28/2022   HGB 15.6 04/28/2022   HCT 47.3 04/28/2022   MCV 94 04/28/2022   PLT 222 04/28/2022   Lab Results  Component Value Date   NA 141 04/28/2022   K 4.5 04/28/2022   CO2 25 04/28/2022   GLUCOSE 89 04/28/2022   BUN 13 04/28/2022   CREATININE 1.09 04/28/2022   BILITOT 0.9 04/28/2022   ALKPHOS 109 04/28/2022   AST 21 04/28/2022   ALT 19 04/28/2022   PROT 7.0 04/28/2022   ALBUMIN 4.1 04/28/2022   CALCIUM 9.5 04/28/2022   ANIONGAP 7 01/10/2022   EGFR 73 04/28/2022   Lab Results  Component Value Date   CHOL 141 04/28/2022   Lab Results  Component Value Date   HDL 42 04/28/2022   Lab Results  Component Value Date   LDLCALC 66 04/28/2022   Lab Results  Component Value Date   TRIG 197 (H) 04/28/2022   Lab Results  Component Value Date   CHOLHDL 3.4 04/28/2022   Lab Results  Component Value Date   HGBA1C 5.7 (H) 04/28/2022       Assessment & Plan:   1. Excessive cerumen in ear canal, bilateral - Ear wax removal    Ear wax removed today Use Debrox ear wax drops as needed   Follow-up as needed    Follow-up: PRN  An After Visit Summary was printed and given to the patient.  I, Janie Morning, NP, have reviewed all documentation for this visit. The documentation on 06/27/22 for the exam, diagnosis, procedures, and orders are all accurate and complete.   Signed, Janie Morning, NP [facility] [phone]

## 2022-06-27 ENCOUNTER — Encounter: Payer: Self-pay | Admitting: Nurse Practitioner

## 2022-07-18 ENCOUNTER — Other Ambulatory Visit: Payer: Self-pay | Admitting: Physician Assistant

## 2022-07-21 ENCOUNTER — Ambulatory Visit (INDEPENDENT_AMBULATORY_CARE_PROVIDER_SITE_OTHER): Payer: Medicare HMO

## 2022-07-21 VITALS — BP 110/68 | HR 74 | Ht 75.0 in | Wt 266.0 lb

## 2022-07-21 DIAGNOSIS — Z1331 Encounter for screening for depression: Secondary | ICD-10-CM

## 2022-07-21 DIAGNOSIS — Z1211 Encounter for screening for malignant neoplasm of colon: Secondary | ICD-10-CM

## 2022-07-21 DIAGNOSIS — Z0000 Encounter for general adult medical examination without abnormal findings: Secondary | ICD-10-CM

## 2022-07-21 NOTE — Progress Notes (Signed)
Subjective:   Bryan Miller is a 71 y.o. male who presents for Medicare Annual/Subsequent preventive examination.  This wellness visit is conducted by a nurse.  The patient's medications were reviewed and reconciled since the patient's last visit.  History details were provided by the patient.  The history appears to be reliable.    Medical History: Patient history and Family history was reviewed  Medications, Allergies, and preventative health maintenance was reviewed and updated.  Cardiac Risk Factors include: advanced age (>52men, >27 women);diabetes mellitus;male gender;obesity (BMI >30kg/m2)     Objective:    Today's Vitals   07/21/22 0906  BP: 110/68  Pulse: 74  Weight: 266 lb (120.7 kg)  Height: 6\' 3"  (1.905 m)  PainSc: 0-No pain   Body mass index is 33.25 kg/m.     01/08/2022    4:00 PM  Advanced Directives  Does Patient Have a Medical Advance Directive? Yes  Type of Advance Directive Living will;Healthcare Power of Attorney  Does patient want to make changes to medical advance directive? No - Patient declined  Copy of Healthcare Power of Attorney in Chart? No - copy requested    Current Medications (verified) Outpatient Encounter Medications as of 07/21/2022  Medication Sig   acetaminophen (TYLENOL) 650 MG CR tablet Take 650 mg by mouth every 8 (eight) hours as needed for pain.   amiodarone (PACERONE) 200 MG tablet Take 1 tablet (200 mg total) by mouth daily.   aspirin 81 MG EC tablet Take 1 tablet by mouth daily.   atorvastatin (LIPITOR) 40 MG tablet TAKE 1 TABLET(40 MG) BY MOUTH DAILY   clopidogrel (PLAVIX) 75 MG tablet TAKE 1 TABLET(75 MG) BY MOUTH DAILY   empagliflozin (JARDIANCE) 10 MG TABS tablet Take 1 tablet (10 mg total) by mouth daily before breakfast.   furosemide (LASIX) 40 MG tablet TAKE 1 TABLET BY MOUTH EVERY OTHER DAY   isosorbide mononitrate (IMDUR) 30 MG 24 hr tablet TAKE 1 TABLET BY MOUTH EVERY DAY   levothyroxine (SYNTHROID) 88 MCG tablet  Take 1 tablet (88 mcg total) by mouth daily before breakfast.   loratadine (CLARITIN) 10 MG tablet Take 10 mg by mouth daily as needed for allergies.   metoprolol succinate (TOPROL-XL) 25 MG 24 hr tablet TAKE 1 TABLET(25 MG) BY MOUTH AT BEDTIME   nitroGLYCERIN (NITROSTAT) 0.4 MG SL tablet Place 0.4 mg under the tongue every 5 (five) minutes as needed for chest pain.   OVER THE COUNTER MEDICATION Place 1 spray into both nostrils as needed (nasal congestion). Xytiol   ranolazine (RANEXA) 500 MG 12 hr tablet TAKE 1 TABLET BY MOUTH TWICE DAILY   spironolactone (ALDACTONE) 25 MG tablet Take 0.5 tablets by mouth at bedtime.   losartan (COZAAR) 25 MG tablet Take 0.5 tablets (12.5 mg total) by mouth at bedtime.   No facility-administered encounter medications on file as of 07/21/2022.    Allergies (verified) Patient has no known allergies.   History: Past Medical History:  Diagnosis Date   CAD (coronary artery disease)    Chronic kidney disease, stage 3a (HCC)    Chronic systolic CHF (congestive heart failure) (HCC)    Dilated aortic root (HCC) 12/05/2018   Hyperlipidemia 08/15/2020   Hypothyroidism    Left bundle branch block 09/14/2019   Old myocardial infarction 06/26/2011   Prediabetes 05/05/2010   Presence of cardiac defibrillator    S/P CABG (coronary artery bypass graft)    Ventricular tachycardia, monomorphic (HCC)    Past Surgical History:  Procedure  Laterality Date   ABDOMINAL HERNIA REPAIR  2020   bypass  2001   CHOLECYSTECTOMY  1996   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   LEFT HEART CATH AND CORS/GRAFTS ANGIOGRAPHY N/A 01/07/2022   Procedure: LEFT HEART CATH AND CORS/GRAFTS ANGIOGRAPHY;  Surgeon: Lyn Records, MD;  Location: [facility];  Service: Cardiovascular;  Laterality: N/A;   Family History  Problem Relation Age of Onset   Dementia Mother    Heart Problems Mother    Heart Problems Father    Social History   Socioeconomic History   Marital  status: Married    Spouse name: Darl Pikes   Number of children: 3   Years of education: Not on file   Highest education level: Not on file  Occupational History   Not on file  Tobacco Use   Smoking status: Former    Types: Cigarettes    Quit date: 1970    Years since quitting: 53.6   Smokeless tobacco: Never  Vaping Use   Vaping Use: Never used  Substance and Sexual Activity   Alcohol use: Not Currently   Drug use: Never   Sexual activity: Yes    Partners: Female  Other Topics Concern   Not on file  Social History Narrative   Retired - currently Armed forces operational officer at [employer].  Lives with wife, moved back to [place] from [place] a couple if years ago.  Has one son and two daughters.   Social Determinants of Health   Financial Resource Strain: Not on file  Food Insecurity: No Food Insecurity (07/21/2022)   Hunger Vital Sign    Worried About Running Out of Food in the Last Year: Never true    Ran Out of Food in the Last Year: Never true  Transportation Needs: No Transportation Needs (07/21/2022)   PRAPARE - Administrator, Civil Service (Medical): No    Lack of Transportation (Non-Medical): No  Physical Activity: Sufficiently Active (07/21/2022)   Exercise Vital Sign    Days of Exercise per Week: 7 days    Minutes of Exercise per Session: 60 min  Stress: No Stress Concern Present (07/21/2022)   [employer] of Occupational Health - Occupational Stress Questionnaire    Feeling of Stress : Not at all  Social Connections: Not on file    Tobacco Counseling Counseling given: No tobacco use since 1970's   Clinical Intake:  Pre-visit preparation completed: Yes Pain : No/denies pain Pain Score: 0-No pain   BMI - recorded: 33.25 Nutritional Status: BMI > 30  Obese Nutritional Risks: None Diabetes: Yes (A1C 5.7) CBG done?: No Did pt. bring in CBG monitor from home?: No How often do you need to have someone help you when you read instructions, pamphlets, or other written  materials from your doctor or pharmacy?: 1 - Never Interpreter Needed?: No Information entered by :: Kim  Activities of Daily Living    07/21/2022   10:54 AM 01/08/2022    3:00 PM  In your present state of health, do you have any difficulty performing the following activities:  Hearing? 0 0  Vision? 0 0  Difficulty concentrating or making decisions? 0 0  Walking or climbing stairs? 0 0  Dressing or bathing? 0 0  Doing errands, shopping? 0 0  Preparing Food and eating ? N   Using the Toilet? N   In the past six months, have you accidently leaked urine? N   Do you have problems with loss of  bowel control? N   Managing your Medications? N   Managing your Finances? N   Housekeeping or managing your Housekeeping? N     Patient Care Team: Abigail Miyamoto, MD as PCP - General (Family Medicine) Orbie Pyo, MD as PCP - Cardiology (Cardiology)     Assessment:   This is a routine wellness examination for Taesean.  Hearing/Vision screen No results found.  Dietary issues and exercise activities discussed: Current Exercise Habits: Home exercise routine, Type of exercise: walking;Other - see comments (swimming, water exercise), Time (Minutes): 60, Frequency (Times/Week): 6, Weekly Exercise (Minutes/Week): 360, Intensity: Moderate, Exercise limited by: None identified   Goals Addressed             This Visit's Progress    Increase physical activity   On track    Swims and walks daily       Depression Screen    07/21/2022   10:52 AM 04/28/2022    9:48 AM 03/18/2021    3:08 PM 03/18/2021    3:03 PM 03/18/2021    2:52 PM  PHQ 2/9 Scores  PHQ - 2 Score 0 0 0 0 0    Fall Risk    07/21/2022   10:54 AM 04/28/2022    9:47 AM 03/18/2021    3:11 PM 03/18/2021    3:07 PM 03/18/2021    3:03 PM  Fall Risk   Falls in the past year? 0 0 0 0 0  Number falls in past yr: 0 0 0 0 0  Injury with Fall? 0 0 0 0 0  Risk for fall due to : No Fall Risks Impaired balance/gait      Follow up Falls evaluation completed Education provided Falls evaluation completed Falls evaluation completed Falls evaluation completed    FALL RISK PREVENTION PERTAINING TO THE HOME:  Any stairs in or around the home? No  If so, are there any without handrails? No  Home free of loose throw rugs in walkways, pet beds, electrical cords, etc? Yes  Adequate lighting in your home to reduce risk of falls? Yes   ASSISTIVE DEVICES UTILIZED TO PREVENT FALLS:  Life alert? No  Use of a cane, walker or w/c? No  Gait steady and fast without use of assistive device  Cognitive Function:        07/21/2022   10:55 AM  6CIT Screen  What Year? 0 points  What month? 0 points  What time? 0 points  Count back from 20 0 points  Months in reverse 0 points  Repeat phrase 0 points  Total Score 0 points    Immunizations Immunization History  Administered Date(s) Administered   Influenza, Seasonal, Injecte, Preservative Fre 01/06/2012   Influenza,inj,Quad PF,6+ Mos 11/30/2012, 10/03/2013, 09/21/2016, 06/18/2019   Influenza,inj,quad, With Preservative 10/10/2015   PPD Test 10/27/2012   Pneumococcal Conjugate-13 10/10/2015   Pneumococcal Polysaccharide-23 05/04/2010, 01/25/2017   Tdap 10/03/2013   Zoster, Live 08/08/2018    TDAP status: Up to date  Flu Vaccine status: Up to date  Pneumococcal vaccine status: Up to date  Covid-19 vaccine status: Declined, Education has been provided regarding the importance of this vaccine but patient still declined. Advised may receive this vaccine at local pharmacy or Health Dept.or vaccine clinic. Aware to provide a copy of the vaccination record if obtained from local pharmacy or Health Dept. Verbalized acceptance and understanding.  Qualifies for Shingles Vaccine? Yes   Zostavax completed No   Shingrix Completed?: No.    Education  has been provided regarding the importance of this vaccine. Patient has been advised to call insurance company to  determine out of pocket expense if they have not yet received this vaccine. Advised may also receive vaccine at local pharmacy or Health Dept. Verbalized acceptance and understanding.  Screening Tests Health Maintenance  Topic Date Due   Zoster Vaccines- Shingrix (1 of 2) Never done   Fecal DNA (Cologuard)  Never done   Hepatitis C Screening  10/26/2022 (Originally 08/31/1969)   INFLUENZA VACCINE  07/27/2022   TETANUS/TDAP  10/04/2023   Pneumonia Vaccine 8+ Years old  Completed   HPV VACCINES  Aged Out   COVID-19 Vaccine  Discontinued    Health Maintenance  Health Maintenance Due  Topic Date Due   Zoster Vaccines- Shingrix (1 of 2) Never done   Fecal DNA (Cologuard)  Never done    Colorectal cancer screening: Cologuard ordered  Lung Cancer Screening: (Low Dose CT Chest recommended if Age 65-80 years, 30 pack-year currently smoking OR have quit w/in 15years.) does not qualify.    Additional Screening:  Vision Screening: Recommended annual ophthalmology exams for early detection of glaucoma and other disorders of the eye. Is the patient up to date with their annual eye exam?  No   Dental Screening: Recommended annual dental exams for proper oral hygiene    Plan:    1- Schedule diabetic eye exam 2- Flu vaccine due in the fall 3- Cologuard ordered  I have personally reviewed and noted the following in the patient's chart:   Medical and social history Use of alcohol, tobacco or illicit drugs  Current medications and supplements including opioid prescriptions. Patient is not currently taking opioid prescriptions. Functional ability and status Nutritional status Physical activity Advanced directives List of other physicians Hospitalizations, surgeries, and ER visits in previous 12 months Vitals Screenings to include cognitive, depression, and falls Referrals and appointments  In addition, I have reviewed and discussed with patient certain preventive protocols, quality  metrics, and best practice recommendations. A written personalized care plan for preventive services as well as general preventive health recommendations were provided to patient.     Jacklynn Bue, LPN   [id number]

## 2022-07-21 NOTE — Patient Instructions (Signed)
Mr. Bryan Miller , Thank you for taking time to come for your Medicare Wellness Visit. I appreciate your ongoing commitment to your health goals. Please review the following plan we discussed and let me know if I can assist you in the future.   Screening recommendations/referrals: Colonoscopy: Cologuard at home screening ordered Recommended yearly ophthalmology/optometry visit for diabetic retinopathy screening, glaucoma screening and checkup Recommended yearly dental visit for hygiene and checkup  Vaccinations: Influenza vaccine: Due in Fall 2023 Pneumococcal vaccine: completed Tdap vaccine: due 2024 Shingles vaccine: due - can get at most local pharmacies    Advanced directives: please bring a copy for your medical record   Preventive Care 65 Years and Older, Male Preventive care refers to lifestyle choices and visits with your health care provider that can promote health and wellness. What does preventive care include? A yearly physical exam. This is also called an annual well check. Dental exams once or twice a year. Routine eye exams. Ask your health care provider how often you should have your eyes checked. Personal lifestyle choices, including: Daily care of your teeth and gums. Regular physical activity. Eating a healthy diet. Avoiding tobacco and drug use. Limiting alcohol use. Practicing safe sex. Taking low doses of aspirin every day. Taking vitamin and mineral supplements as recommended by your health care provider. What happens during an annual well check? The services and screenings done by your health care provider during your annual well check will depend on your age, overall health, lifestyle risk factors, and family history of disease. Counseling  Your health care provider may ask you questions about your: Alcohol use. Tobacco use. Drug use. Emotional well-being. Home and relationship well-being. Sexual activity. Eating habits. History of falls. Memory and  ability to understand (cognition). Work and work Astronomer. Screening  You may have the following tests or measurements: Height, weight, and BMI. Blood pressure. Lipid and cholesterol levels. These may be checked every 5 years, or more frequently if you are over 52 years old. Skin check. Lung cancer screening. You may have this screening every year starting at age 21 if you have a 30-pack-year history of smoking and currently smoke or have quit within the past 15 years. Fecal occult blood test (FOBT) of the stool. You may have this test every year starting at age 68. Flexible sigmoidoscopy or colonoscopy. You may have a sigmoidoscopy every 5 years or a colonoscopy every 10 years starting at age 72. Prostate cancer screening. Recommendations will vary depending on your family history and other risks. Hepatitis C blood test. Hepatitis B blood test. Sexually transmitted disease (STD) testing. Diabetes screening. This is done by checking your blood sugar (glucose) after you have not eaten for a while (fasting). You may have this done every 1-3 years. Abdominal aortic aneurysm (AAA) screening. You may need this if you are a current or former smoker. Osteoporosis. You may be screened starting at age 40 if you are at high risk. Talk with your health care provider about your test results, treatment options, and if necessary, the need for more tests. Vaccines  Your health care provider may recommend certain vaccines, such as: Influenza vaccine. This is recommended every year. Tetanus, diphtheria, and acellular pertussis (Tdap, Td) vaccine. You may need a Td booster every 10 years. Zoster vaccine. You may need this after age 77. Pneumococcal 13-valent conjugate (PCV13) vaccine. One dose is recommended after age 58. Pneumococcal polysaccharide (PPSV23) vaccine. One dose is recommended after age 29. Talk to your health care provider  about which screenings and vaccines you need and how often you need  them. This information is not intended to replace advice given to you by your health care provider. Make sure you discuss any questions you have with your health care provider. Document Released: 01/09/2016 Document Revised: 09/01/2016 Document Reviewed: 10/14/2015 Elsevier Interactive Patient Education  2017 [place] Prevention in the Home Falls can cause injuries. They can happen to people of all ages. There are many things you can do to make your home safe and to help prevent falls. What can I do on the outside of my home? Regularly fix the edges of walkways and driveways and fix any cracks. Remove anything that might make you trip as you walk through a door, such as a raised step or threshold. Trim any bushes or trees on the path to your home. Use bright outdoor lighting. Clear any walking paths of anything that might make someone trip, such as rocks or tools. Regularly check to see if handrails are loose or broken. Make sure that both sides of any steps have handrails. Any raised decks and porches should have guardrails on the edges. Have any leaves, snow, or ice cleared regularly. Use sand or salt on walking paths during winter. Clean up any spills in your garage right away. This includes oil or grease spills. What can I do in the bathroom? Use night lights. Install grab bars by the toilet and in the tub and shower. Do not use towel bars as grab bars. Use non-skid mats or decals in the tub or shower. If you need to sit down in the shower, use a plastic, non-slip stool. Keep the floor dry. Clean up any water that spills on the floor as soon as it happens. Remove soap buildup in the tub or shower regularly. Attach bath mats securely with double-sided non-slip rug tape. Do not have throw rugs and other things on the floor that can make you trip. What can I do in the bedroom? Use night lights. Make sure that you have a light by your bed that is easy to reach. Do not use  any sheets or blankets that are too big for your bed. They should not hang down onto the floor. Have a firm chair that has side arms. You can use this for support while you get dressed. Do not have throw rugs and other things on the floor that can make you trip. What can I do in the kitchen? Clean up any spills right away. Avoid walking on wet floors. Keep items that you use a lot in easy-to-reach places. If you need to reach something above you, use a strong step stool that has a grab bar. Keep electrical cords out of the way. Do not use floor polish or wax that makes floors slippery. If you must use wax, use non-skid floor wax. Do not have throw rugs and other things on the floor that can make you trip. What can I do with my stairs? Do not leave any items on the stairs. Make sure that there are handrails on both sides of the stairs and use them. Fix handrails that are broken or loose. Make sure that handrails are as long as the stairways. Check any carpeting to make sure that it is firmly attached to the stairs. Fix any carpet that is loose or worn. Avoid having throw rugs at the top or bottom of the stairs. If you do have throw rugs, attach them to the floor  with carpet tape. Make sure that you have a light switch at the top of the stairs and the bottom of the stairs. If you do not have them, ask someone to add them for you. What else can I do to help prevent falls? Wear shoes that: Do not have high heels. Have rubber bottoms. Are comfortable and fit you well. Are closed at the toe. Do not wear sandals. If you use a stepladder: Make sure that it is fully opened. Do not climb a closed stepladder. Make sure that both sides of the stepladder are locked into place. Ask someone to hold it for you, if possible. Clearly mark and make sure that you can see: Any grab bars or handrails. First and last steps. Where the edge of each step is. Use tools that help you move around (mobility aids)  if they are needed. These include: Canes. Walkers. Scooters. Crutches. Turn on the lights when you go into a dark area. Replace any light bulbs as soon as they burn out. Set up your furniture so you have a clear path. Avoid moving your furniture around. If any of your floors are uneven, fix them. If there are any pets around you, be aware of where they are. Review your medicines with your doctor. Some medicines can make you feel dizzy. This can increase your chance of falling. Ask your doctor what other things that you can do to help prevent falls. This information is not intended to replace advice given to you by your health care provider. Make sure you discuss any questions you have with your health care provider. Document Released: 10/09/2009 Document Revised: 05/20/2016 Document Reviewed: 01/17/2015 Elsevier Interactive Patient Education  2017 [employer].

## 2022-07-27 ENCOUNTER — Ambulatory Visit (INDEPENDENT_AMBULATORY_CARE_PROVIDER_SITE_OTHER): Payer: Medicare HMO

## 2022-07-27 DIAGNOSIS — I255 Ischemic cardiomyopathy: Secondary | ICD-10-CM | POA: Diagnosis not present

## 2022-07-27 DIAGNOSIS — I420 Dilated cardiomyopathy: Secondary | ICD-10-CM | POA: Diagnosis not present

## 2022-07-27 LAB — CUP PACEART REMOTE DEVICE CHECK
Battery Remaining Longevity: 78 mo
Battery Remaining Percentage: 100 %
Brady Statistic RA Percent Paced: 17 %
Brady Statistic RV Percent Paced: 99 %
Date Time Interrogation Session: 20230801004200
HighPow Impedance: 76 Ohm
Implantable Pulse Generator Implant Date: 20181022
Lead Channel Impedance Value: 435 Ohm
Lead Channel Impedance Value: 631 Ohm
Lead Channel Impedance Value: 971 Ohm
Lead Channel Pacing Threshold Amplitude: 0.6 V
Lead Channel Pacing Threshold Amplitude: 0.8 V
Lead Channel Pacing Threshold Amplitude: 1.5 V
Lead Channel Pacing Threshold Pulse Width: 0.4 ms
Lead Channel Pacing Threshold Pulse Width: 0.4 ms
Lead Channel Pacing Threshold Pulse Width: 0.4 ms
Lead Channel Setting Pacing Amplitude: 2 V
Lead Channel Setting Pacing Amplitude: 2 V
Lead Channel Setting Pacing Amplitude: 2.6 V
Lead Channel Setting Pacing Pulse Width: 0.4 ms
Lead Channel Setting Pacing Pulse Width: 0.4 ms
Lead Channel Setting Sensing Sensitivity: 0.6 mV
Lead Channel Setting Sensing Sensitivity: 1 mV
Pulse Gen Serial Number: 181086

## 2022-07-30 ENCOUNTER — Encounter: Payer: Self-pay | Admitting: Internal Medicine

## 2022-08-05 ENCOUNTER — Encounter: Payer: Self-pay | Admitting: Internal Medicine

## 2022-08-25 NOTE — Progress Notes (Signed)
Remote ICD transmission.   

## 2022-09-01 ENCOUNTER — Ambulatory Visit: Payer: Medicare HMO | Admitting: Legal Medicine

## 2022-09-07 ENCOUNTER — Encounter: Payer: Self-pay | Admitting: Internal Medicine

## 2022-09-10 ENCOUNTER — Other Ambulatory Visit: Payer: Self-pay | Admitting: Physician Assistant

## 2022-09-22 ENCOUNTER — Encounter: Payer: Self-pay | Admitting: Legal Medicine

## 2022-09-22 ENCOUNTER — Ambulatory Visit (INDEPENDENT_AMBULATORY_CARE_PROVIDER_SITE_OTHER): Payer: Medicare HMO | Admitting: Legal Medicine

## 2022-09-22 VITALS — BP 100/60 | HR 66 | Temp 97.6°F | Resp 15 | Ht 75.0 in | Wt 260.0 lb

## 2022-09-22 DIAGNOSIS — I2510 Atherosclerotic heart disease of native coronary artery without angina pectoris: Secondary | ICD-10-CM

## 2022-09-22 DIAGNOSIS — E0590 Thyrotoxicosis, unspecified without thyrotoxic crisis or storm: Secondary | ICD-10-CM | POA: Diagnosis not present

## 2022-09-22 DIAGNOSIS — E782 Mixed hyperlipidemia: Secondary | ICD-10-CM

## 2022-09-22 DIAGNOSIS — N1831 Chronic kidney disease, stage 3a: Secondary | ICD-10-CM

## 2022-09-22 DIAGNOSIS — Z95810 Presence of automatic (implantable) cardiac defibrillator: Secondary | ICD-10-CM

## 2022-09-22 DIAGNOSIS — R7303 Prediabetes: Secondary | ICD-10-CM | POA: Diagnosis not present

## 2022-09-22 DIAGNOSIS — E063 Autoimmune thyroiditis: Secondary | ICD-10-CM | POA: Diagnosis not present

## 2022-09-22 DIAGNOSIS — E038 Other specified hypothyroidism: Secondary | ICD-10-CM | POA: Diagnosis not present

## 2022-09-22 DIAGNOSIS — E039 Hypothyroidism, unspecified: Secondary | ICD-10-CM | POA: Insufficient documentation

## 2022-09-22 DIAGNOSIS — I5022 Chronic systolic (congestive) heart failure: Secondary | ICD-10-CM | POA: Diagnosis not present

## 2022-09-22 NOTE — Progress Notes (Signed)
Subjective:  Patient ID: Bryan Miller, male    DOB: 05-25-1951  Age: 71 y.o. MRN: [id number]  Chief Complaint  Patient presents with   Hyperlipidemia   Prediabetes   Coronary Artery Disease    HPI Patient presents with hyperlipidemia.  Compliance with treatment has been good; patient takes medicines as directed, maintains low cholesterol diet, follows up as directed, and maintains exercise regimen.  Patient is using Atorvastatin 40 mg daily without problems.    Hypothyroidism: Taking Levothyroxine 88 mcg daily.  Prediabetes: not Taking Jardiance 10 mg daily. Donut hole  CHF: He takes spironolactone 25 mg 1/2 tablet daily, Amiodarone 200 mg daily, Isosorbide 30 mg daily.   Current Outpatient Medications on File Prior to Visit  Medication Sig Dispense Refill   acetaminophen (TYLENOL) 650 MG CR tablet Take 650 mg by mouth every 8 (eight) hours as needed for pain.     amiodarone (PACERONE) 200 MG tablet Take 1 tablet (200 mg total) by mouth daily. 90 tablet 3   aspirin 81 MG EC tablet Take 1 tablet by mouth daily.     atorvastatin (LIPITOR) 40 MG tablet TAKE 1 TABLET(40 MG) BY MOUTH DAILY 90 tablet 2   clopidogrel (PLAVIX) 75 MG tablet TAKE 1 TABLET(75 MG) BY MOUTH DAILY 90 tablet 2   furosemide (LASIX) 40 MG tablet TAKE 1 TABLET BY MOUTH EVERY OTHER DAY 90 tablet 3   isosorbide mononitrate (IMDUR) 30 MG 24 hr tablet TAKE 1 TABLET BY MOUTH EVERY DAY 90 tablet 2   levothyroxine (SYNTHROID) 88 MCG tablet Take 1 tablet (88 mcg total) by mouth daily before breakfast. 30 tablet 1   loratadine (CLARITIN) 10 MG tablet Take 10 mg by mouth daily as needed for allergies.     losartan (COZAAR) 25 MG tablet Take 0.5 tablets (12.5 mg total) by mouth at bedtime. 45 tablet 3   metoprolol succinate (TOPROL-XL) 25 MG 24 hr tablet TAKE 1 TABLET(25 MG) BY MOUTH AT BEDTIME 90 tablet 3   nitroGLYCERIN (NITROSTAT) 0.4 MG SL tablet Place 0.4 mg under the tongue every 5 (five) minutes as needed for chest  pain.     OVER THE COUNTER MEDICATION Place 1 spray into both nostrils as needed (nasal congestion). Xytiol     ranolazine (RANEXA) 500 MG 12 hr tablet TAKE 1 TABLET BY MOUTH TWICE DAILY 180 tablet 3   spironolactone (ALDACTONE) 25 MG tablet Take 0.5 tablets by mouth at bedtime.     empagliflozin (JARDIANCE) 10 MG TABS tablet Take 1 tablet (10 mg total) by mouth daily before breakfast. (Patient not taking: Reported on 09/22/2022) 90 tablet 3   No current facility-administered medications on file prior to visit.   Past Medical History:  Diagnosis Date   CAD (coronary artery disease)    Chronic kidney disease, stage 3a (HCC)    Chronic systolic CHF (congestive heart failure) (HCC)    Dilated aortic root (HCC) 12/05/2018   Hyperlipidemia 08/15/2020   Hypothyroidism    Left bundle branch block 09/14/2019   Old myocardial infarction 06/26/2011   Prediabetes 05/05/2010   Presence of cardiac defibrillator    S/P CABG (coronary artery bypass graft)    Ventricular tachycardia, monomorphic (HCC)    Past Surgical History:  Procedure Laterality Date   ABDOMINAL HERNIA REPAIR  2020   bypass  2001   CHOLECYSTECTOMY  1996   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   LEFT HEART CATH AND CORS/GRAFTS ANGIOGRAPHY N/A 01/07/2022   Procedure: LEFT HEART CATH  AND CORS/GRAFTS ANGIOGRAPHY;  Surgeon: Lyn Records, MD;  Location: [facility] INVASIVE CV LAB;  Service: Cardiovascular;  Laterality: N/A;    Family History  Problem Relation Age of Onset   Dementia Mother    Heart Problems Mother    Heart Problems Father    Social History   Socioeconomic History   Marital status: Married    Spouse name: Darl Pikes   Number of children: 3   Years of education: Not on file   Highest education level: Not on file  Occupational History   Not on file  Tobacco Use   Smoking status: Former    Types: Cigarettes    Quit date: 1970    Years since quitting: 53.7   Smokeless tobacco: Never  Vaping Use   Vaping Use:  Never used  Substance and Sexual Activity   Alcohol use: Not Currently   Drug use: Never   Sexual activity: Yes    Partners: Female  Other Topics Concern   Not on file  Social History Narrative   Retired - currently Armed forces operational officer at [employer].  Lives with wife, moved back to [place] from [place] a couple if years ago.  Has one son and two daughters.   Social Determinants of Health   Financial Resource Strain: Not on file  Food Insecurity: No Food Insecurity (07/21/2022)   Hunger Vital Sign    Worried About Running Out of Food in the Last Year: Never true    Ran Out of Food in the Last Year: Never true  Transportation Needs: No Transportation Needs (07/21/2022)   PRAPARE - Administrator, Civil Service (Medical): No    Lack of Transportation (Non-Medical): No  Physical Activity: Sufficiently Active (07/21/2022)   Exercise Vital Sign    Days of Exercise per Week: 7 days    Minutes of Exercise per Session: 60 min  Stress: No Stress Concern Present (07/21/2022)   [employer] of Occupational Health - Occupational Stress Questionnaire    Feeling of Stress : Not at all  Social Connections: Not on file    Review of Systems  Constitutional:  Negative for chills, fatigue, fever and unexpected weight change.  HENT:  Negative for congestion, ear pain, sinus pain and sore throat.   Eyes:  Negative for visual disturbance.  Respiratory:  Negative for cough and shortness of breath.   Cardiovascular:  Negative for chest pain and palpitations.  Gastrointestinal:  Negative for abdominal pain, blood in stool, constipation, diarrhea, nausea and vomiting.  Endocrine: Negative for polydipsia.  Genitourinary:  Negative for dysuria.  Musculoskeletal:  Negative for back pain.  Skin:  Negative for rash.  Neurological:  Negative for headaches.  Psychiatric/Behavioral: Negative.       Objective:  BP 100/60   Pulse 66   Temp 97.6 F (36.4 C)   Resp 15   Ht 6\' 3"  (1.905 m)   Wt 260 lb  (117.9 kg)   SpO2 95%   BMI 32.50 kg/m      09/22/2022    1:49 PM 07/21/2022    9:06 AM 06/24/2022    2:51 PM  BP/Weight  Systolic BP 100 110 110  Diastolic BP 60 68 78  Wt. (Lbs) 260 266 268  BMI 32.5 kg/m2 33.25 kg/m2 33.5 kg/m2    Physical Exam Vitals reviewed.  Constitutional:      Appearance: Normal appearance.  HENT:     Head: Normocephalic and atraumatic.     Right Ear: Tympanic membrane normal.  Left Ear: Tympanic membrane normal.     Mouth/Throat:     Mouth: Mucous membranes are moist.     Pharynx: Oropharynx is clear.  Eyes:     Extraocular Movements: Extraocular movements intact.     Conjunctiva/sclera: Conjunctivae normal.     Pupils: Pupils are equal, round, and reactive to light.  Cardiovascular:     Rate and Rhythm: Normal rate and regular rhythm.  Pulmonary:     Effort: Pulmonary effort is normal. No respiratory distress.     Breath sounds: Normal breath sounds. No wheezing.  Abdominal:     General: Abdomen is flat. Bowel sounds are normal. There is no distension.     Tenderness: There is no abdominal tenderness.  Musculoskeletal:        General: Normal range of motion.     Cervical back: Normal range of motion and neck supple.  Skin:    General: Skin is warm.     Capillary Refill: Capillary refill takes less than 2 seconds.  Neurological:     General: No focal deficit present.     Mental Status: He is alert and oriented to person, place, and time. Mental status is at baseline.     Sensory: Sensory deficit present.     Gait: Gait normal.  Psychiatric:        Mood and Affect: Mood normal.        Thought Content: Thought content normal.        Judgment: Judgment normal.     Diabetic Foot Exam - Simple   Simple Foot Form Diabetic Foot exam was performed with the following findings: Yes 09/22/2022  2:21 PM  Visual Inspection No deformities, no ulcerations, no other skin breakdown bilaterally: Yes Sensation Testing Intact to touch and  monofilament testing bilaterally: Yes Pulse Check Posterior Tibialis and Dorsalis pulse intact bilaterally: Yes Comments      Lab Results  Component Value Date   WBC 11.3 (H) 09/22/2022   HGB 15.5 09/22/2022   HCT 46.5 09/22/2022   PLT 248 09/22/2022   GLUCOSE 97 09/22/2022   CHOL 217 (H) 09/22/2022   TRIG 195 (H) 09/22/2022   HDL 40 09/22/2022   LDLCALC 142 (H) 09/22/2022   ALT 21 09/22/2022   AST 21 09/22/2022   NA 140 09/22/2022   K 4.3 09/22/2022   CL 100 09/22/2022   CREATININE 1.12 09/22/2022   BUN 15 09/22/2022   CO2 25 09/22/2022   TSH 5.900 (H) 09/22/2022   HGBA1C 5.6 09/22/2022      Assessment & Plan:   Problem List Items Addressed This Visit       Cardiovascular and Mediastinum   Atherosclerotic heart disease of native coronary artery without angina pectoris - Primary An individual plan was formulated based on patient history and exam, labs and evidence based data. Patient has not had recent angina or nitroglycerin use. continue present treatment.     Chronic systolic heart failure [facility]) An individualized care plan was established and reinforced.  The patient's disease status was assessed using clinical finding son exam today, labs, and/or other diagnostic testing such as x-rays, to determine the patient's success in meeting treatmentgoalsbased on disease-based guidelines and found to beimproving. But not at goal yet. Medications prescriptions no changes Laboratory tests ordered to be performed today include routine. RECOMMENDATIONS: given include see cardiology.  Call physician is patient gains 3 lbs in one day or 5 lbs for one week.  Call for progressive PND, orthopnea or increased pedal  edema.      Endocrine   Hypothyroidism Patient is known to have hypothyroidism and is n treatment with levothyroxine .  Patient was diagnosed 10 years ago.  Other treatment includes no surgery.  Patient is compliant with medicines and last TSH 6 months ago.  Last TSH  was normal.      Genitourinary   Chronic kidney disease, stage 3a (HCC)   Relevant Orders   Comprehensive metabolic panel (Completed)   CBC with Differential/Platelet (Completed) Patient was evaluated for stage 3a.  It is important to maintain good Blood Pressure control . Keep on low protein diet and remain with adequate hydration to maintain function.      Other   Presence of cardiac defibrillator Cardiac defibrillator intact    Hyperlipidemia   Relevant Orders   Lipid panel (Completed) AN INDIVIDUAL CARE PLAN for hyperlipidemia/ cholesterol was established and reinforced today.  The patient's status was assessed using clinical findings on exam, lab and other diagnostic tests. The patient's disease status was assessed based on evidence-based guidelines and found to be fair controlled. MEDICATIONS were reviewed. SELF MANAGEMENT GOALS have been discussed and patient's success at attaining the goal of low cholesterol was assessed. RECOMMENDATION given include regular exercise 3 days a week and low cholesterol/low fat diet. CLINICAL SUMMARY including written plan to identify barriers unique to the patient due to social or economic  reasons was discussed.    Prediabetes   Relevant Orders   Hemoglobin A1c (Completed) Patient is on diet and exercise   Other Visit Diagnoses                .    Orders Placed This Encounter  Procedures   Comprehensive metabolic panel   Hemoglobin A1c   Lipid panel   CBC with Differential/Platelet   TSH   Cardiovascular Risk Assessment     Follow-up: Return in about 4 months (around 01/22/2023).  An After Visit Summary was printed and given to the patient.  Brent Bulla, MD [facility] [phone]

## 2022-09-23 LAB — HEMOGLOBIN A1C
Est. average glucose Bld gHb Est-mCnc: 114 mg/dL
Hgb A1c MFr Bld: 5.6 % (ref 4.8–5.6)

## 2022-09-23 LAB — LIPID PANEL
Chol/HDL Ratio: 5.4 ratio — ABNORMAL HIGH (ref 0.0–5.0)
Cholesterol, Total: 217 mg/dL — ABNORMAL HIGH (ref 100–199)
HDL: 40 mg/dL (ref >39)
LDL Chol Calc (NIH): 142 mg/dL — ABNORMAL HIGH (ref 0–99)
Triglycerides: 195 mg/dL — ABNORMAL HIGH (ref 0–149)
VLDL Cholesterol Cal: 35 mg/dL (ref 5–40)

## 2022-09-23 LAB — COMPREHENSIVE METABOLIC PANEL
ALT: 21 IU/L (ref 0–44)
AST: 21 IU/L (ref 0–40)
Albumin/Globulin Ratio: 1.5 (ref 1.2–2.2)
Albumin: 4.1 g/dL (ref 3.8–4.8)
Alkaline Phosphatase: 115 IU/L (ref 44–121)
BUN/Creatinine Ratio: 13 (ref 10–24)
BUN: 15 mg/dL (ref 8–27)
Bilirubin Total: 0.6 mg/dL (ref 0.0–1.2)
CO2: 25 mmol/L (ref 20–29)
Calcium: 9.2 mg/dL (ref 8.6–10.2)
Chloride: 100 mmol/L (ref 96–106)
Creatinine, Ser: 1.12 mg/dL (ref 0.76–1.27)
Globulin, Total: 2.7 g/dL (ref 1.5–4.5)
Glucose: 97 mg/dL (ref 70–99)
Potassium: 4.3 mmol/L (ref 3.5–5.2)
Sodium: 140 mmol/L (ref 134–144)
Total Protein: 6.8 g/dL (ref 6.0–8.5)
eGFR: 70 mL/min/{1.73_m2} (ref >59)

## 2022-09-23 LAB — CBC WITH DIFFERENTIAL/PLATELET
Basophils Absolute: 0.1 10*3/uL (ref 0.0–0.2)
Basos: 0 %
EOS (ABSOLUTE): 0.5 10*3/uL — ABNORMAL HIGH (ref 0.0–0.4)
Eos: 4 %
Hematocrit: 46.5 % (ref 37.5–51.0)
Hemoglobin: 15.5 g/dL (ref 13.0–17.7)
Immature Grans (Abs): 0 10*3/uL (ref 0.0–0.1)
Immature Granulocytes: 0 %
Lymphocytes Absolute: 3.3 10*3/uL — ABNORMAL HIGH (ref 0.7–3.1)
Lymphs: 29 %
MCH: 31.8 pg (ref 26.6–33.0)
MCHC: 33.3 g/dL (ref 31.5–35.7)
MCV: 96 fL (ref 79–97)
Monocytes Absolute: 1.1 10*3/uL — ABNORMAL HIGH (ref 0.1–0.9)
Monocytes: 10 %
Neutrophils Absolute: 6.4 10*3/uL (ref 1.4–7.0)
Neutrophils: 57 %
Platelets: 248 10*3/uL (ref 150–450)
RBC: 4.87 x10E6/uL (ref 4.14–5.80)
RDW: 13.2 % (ref 11.6–15.4)
WBC: 11.3 10*3/uL — ABNORMAL HIGH (ref 3.4–10.8)

## 2022-09-23 LAB — TSH: TSH: 5.9 u[IU]/mL — ABNORMAL HIGH (ref 0.450–4.500)

## 2022-09-23 LAB — CARDIOVASCULAR RISK ASSESSMENT

## 2022-09-24 NOTE — Progress Notes (Signed)
Triacylglycerols high, LDL cholesterol high 142, may need to change atorvastatin to crestor for better cardiac prevention, wbc elevated, any illness, TSH high has he missed doses of thyroid, kidney and liver tests normal, A1c 5.6 lp

## 2022-09-26 DIAGNOSIS — J988 Other specified respiratory disorders: Secondary | ICD-10-CM | POA: Diagnosis not present

## 2022-09-26 DIAGNOSIS — R112 Nausea with vomiting, unspecified: Secondary | ICD-10-CM | POA: Diagnosis not present

## 2022-09-29 ENCOUNTER — Encounter: Payer: Self-pay | Admitting: Legal Medicine

## 2022-09-29 ENCOUNTER — Ambulatory Visit (INDEPENDENT_AMBULATORY_CARE_PROVIDER_SITE_OTHER): Payer: Medicare HMO | Admitting: Legal Medicine

## 2022-09-29 VITALS — BP 94/60 | HR 71 | Temp 97.8°F | Resp 15 | Ht 75.0 in | Wt 258.0 lb

## 2022-09-29 DIAGNOSIS — J40 Bronchitis, not specified as acute or chronic: Secondary | ICD-10-CM | POA: Diagnosis not present

## 2022-09-29 NOTE — Progress Notes (Signed)
Subjective:  Patient ID: Bryan Miller, male    DOB: Mar 31, 1951  Age: 71 y.o. MRN: [id number]  Chief Complaint  Patient presents with   Cough   Wheezing    HPI Patient was seen at urgent care 09/27/22 for cough, lack of energy, and back pain. They diagnosed him with possible pneumonia. They gave him Doxycycline, Tessalon pearls. He feels much better, but he still has cough and wheezing.not tested for Covid.  He is feeling better and little cough, no fever.  He is sleeping well   Current Outpatient Medications on File Prior to Visit  Medication Sig Dispense Refill   acetaminophen (TYLENOL) 650 MG CR tablet Take 650 mg by mouth every 8 (eight) hours as needed for pain.     albuterol (VENTOLIN HFA) 108 (90 Base) MCG/ACT inhaler Inhale into the lungs.     amiodarone (PACERONE) 200 MG tablet Take 1 tablet (200 mg total) by mouth daily. 90 tablet 3   aspirin EC 81 MG tablet Take by mouth.     atorvastatin (LIPITOR) 40 MG tablet TAKE 1 TABLET(40 MG) BY MOUTH DAILY 90 tablet 2   benzonatate (TESSALON) 200 MG capsule Take by mouth.     clopidogrel (PLAVIX) 75 MG tablet TAKE 1 TABLET(75 MG) BY MOUTH DAILY 90 tablet 2   doxycycline (VIBRAMYCIN) 100 MG capsule Take by mouth.     empagliflozin (JARDIANCE) 10 MG TABS tablet Take 1 tablet (10 mg total) by mouth daily before breakfast. 90 tablet 3   furosemide (LASIX) 40 MG tablet TAKE 1 TABLET BY MOUTH EVERY OTHER DAY 90 tablet 3   isosorbide mononitrate (IMDUR) 30 MG 24 hr tablet TAKE 1 TABLET BY MOUTH EVERY DAY 90 tablet 2   levothyroxine (SYNTHROID) 88 MCG tablet Take 1 tablet (88 mcg total) by mouth daily before breakfast. 30 tablet 1   loratadine (CLARITIN) 10 MG tablet Take 10 mg by mouth daily as needed for allergies.     metoprolol succinate (TOPROL-XL) 25 MG 24 hr tablet TAKE 1 TABLET(25 MG) BY MOUTH AT BEDTIME 90 tablet 3   nitroGLYCERIN (NITROSTAT) 0.4 MG SL tablet Place 0.4 mg under the tongue every 5 (five) minutes as needed for chest  pain.     ondansetron (ZOFRAN-ODT) 4 MG disintegrating tablet Take by mouth.     OVER THE COUNTER MEDICATION Place 1 spray into both nostrils as needed (nasal congestion). Xytiol     ranolazine (RANEXA) 500 MG 12 hr tablet TAKE 1 TABLET BY MOUTH TWICE DAILY 180 tablet 3   spironolactone (ALDACTONE) 25 MG tablet Take 0.5 tablets by mouth at bedtime.     losartan (COZAAR) 25 MG tablet Take 0.5 tablets (12.5 mg total) by mouth at bedtime. 45 tablet 3   No current facility-administered medications on file prior to visit.   Past Medical History:  Diagnosis Date   CAD (coronary artery disease)    Chronic kidney disease, stage 3a (HCC)    Chronic systolic CHF (congestive heart failure) (HCC)    Dilated aortic root (HCC) 12/05/2018   Hyperlipidemia 08/15/2020   Hypothyroidism    Left bundle branch block 09/14/2019   Old myocardial infarction 06/26/2011   Prediabetes 05/05/2010   Presence of cardiac defibrillator    S/P CABG (coronary artery bypass graft)    Ventricular tachycardia, monomorphic (HCC)    Past Surgical History:  Procedure Laterality Date   ABDOMINAL HERNIA REPAIR  2020   bypass  2001   CHOLECYSTECTOMY  1996   Implanted cardiac  rhytm patient  10/17/2017   LEFT HEART CATH AND CORS/GRAFTS ANGIOGRAPHY N/A 01/07/2022   Procedure: LEFT HEART CATH AND CORS/GRAFTS ANGIOGRAPHY;  Surgeon: Lyn Records, MD;  Location: [facility] INVASIVE CV LAB;  Service: Cardiovascular;  Laterality: N/A;    Family History  Problem Relation Age of Onset   Dementia Mother    Heart Problems Mother    Heart Problems Father    Social History   Socioeconomic History   Marital status: Married    Spouse name: Darl Pikes   Number of children: 3   Years of education: Not on file   Highest education level: Not on file  Occupational History   Not on file  Tobacco Use   Smoking status: Former    Types: Cigarettes    Quit date: 1970    Years since quitting: 53.8   Smokeless tobacco: Never  Vaping Use    Vaping Use: Never used  Substance and Sexual Activity   Alcohol use: Not Currently   Drug use: Never   Sexual activity: Yes    Partners: Female  Other Topics Concern   Not on file  Social History Narrative   Retired - currently Armed forces operational officer at [employer].  Lives with wife, moved back to [place] from [place] a couple if years ago.  Has one son and two daughters.   Social Determinants of Health   Financial Resource Strain: Not on file  Food Insecurity: No Food Insecurity (07/21/2022)   Hunger Vital Sign    Worried About Running Out of Food in the Last Year: Never true    Ran Out of Food in the Last Year: Never true  Transportation Needs: No Transportation Needs (07/21/2022)   PRAPARE - Administrator, Civil Service (Medical): No    Lack of Transportation (Non-Medical): No  Physical Activity: Sufficiently Active (07/21/2022)   Exercise Vital Sign    Days of Exercise per Week: 7 days    Minutes of Exercise per Session: 60 min  Stress: No Stress Concern Present (07/21/2022)   [employer] of Occupational Health - Occupational Stress Questionnaire    Feeling of Stress : Not at all  Social Connections: Not on file    Review of Systems  Constitutional:  Negative for activity change and appetite change.  HENT:  Positive for congestion.   Eyes:  Negative for visual disturbance.  Respiratory:  Positive for cough.   Gastrointestinal:  Negative for abdominal distention and abdominal pain.  Endocrine: Negative for polyuria.  Genitourinary:  Negative for difficulty urinating.  Musculoskeletal:  Negative for arthralgias and back pain.  Skin: Negative.   Neurological: Negative.      Objective:  BP 94/60   Pulse 71   Temp 97.8 F (36.6 C)   Resp 15   Ht 6\' 3"  (1.905 m)   Wt 258 lb (117 kg)   SpO2 96%   BMI 32.25 kg/m      09/29/2022    8:18 AM 09/22/2022    1:49 PM 07/21/2022    9:06 AM  BP/Weight  Systolic BP 94 100 110  Diastolic BP 60 60 68  Wt. (Lbs) 258 260 266   BMI 32.25 kg/m2 32.5 kg/m2 33.25 kg/m2    Physical Exam Vitals reviewed.  Constitutional:      Appearance: Normal appearance.  HENT:     Head: Normocephalic.     Right Ear: Tympanic membrane normal.     Left Ear: Tympanic membrane normal.     Nose: Nose normal.  Mouth/Throat:     Mouth: Mucous membranes are moist.     Pharynx: Oropharynx is clear.  Eyes:     Extraocular Movements: Extraocular movements intact.     Conjunctiva/sclera: Conjunctivae normal.     Pupils: Pupils are equal, round, and reactive to light.  Cardiovascular:     Rate and Rhythm: Normal rate and regular rhythm.     Pulses: Normal pulses.     Heart sounds: Normal heart sounds. No murmur heard. Pulmonary:     Effort: Pulmonary effort is normal.     Breath sounds: Normal breath sounds.  Abdominal:     General: Abdomen is flat. Bowel sounds are normal. There is no distension.     Palpations: Abdomen is soft.     Tenderness: There is no abdominal tenderness.  Musculoskeletal:        General: Normal range of motion.     Cervical back: Normal range of motion and neck supple.  Skin:    General: Skin is warm.     Capillary Refill: Capillary refill takes less than 2 seconds.  Neurological:     General: No focal deficit present.     Mental Status: He is alert and oriented to person, place, and time. Mental status is at baseline.         Lab Results  Component Value Date   WBC 11.3 (H) 09/22/2022   HGB 15.5 09/22/2022   HCT 46.5 09/22/2022   PLT 248 09/22/2022   GLUCOSE 97 09/22/2022   CHOL 217 (H) 09/22/2022   TRIG 195 (H) 09/22/2022   HDL 40 09/22/2022   LDLCALC 142 (H) 09/22/2022   ALT 21 09/22/2022   AST 21 09/22/2022   NA 140 09/22/2022   K 4.3 09/22/2022   CL 100 09/22/2022   CREATININE 1.12 09/22/2022   BUN 15 09/22/2022   CO2 25 09/22/2022   TSH 5.900 (H) 09/22/2022   HGBA1C 5.6 09/22/2022      Assessment & Plan:   Problem List Items Addressed This Visit        Respiratory   Bronchitis - Primary Patient improving, may RTW 10/04/2022, wear mask and take all doxycycline, An individualize plan was formulated for care of COPD.  Treatment is evidence based.  She will continue on inhalers, avoid smoking and smoke.  Regular exercise with help with dyspnea. Routine follow ups and medication compliance is needed. Trelegy restarted  .       Follow-up: Return if symptoms worsen or fail to improve.  An After Visit Summary was printed and given to the patient.  Brent Bulla, MD [facility] [phone]

## 2022-10-03 MED ORDER — TRELEGY ELLIPTA 100-62.5-25 MCG/ACT IN AEPB: 1.0000 | INHALATION_SPRAY | Freq: Every day | RESPIRATORY_TRACT | 11 refills | Status: DC

## 2022-10-12 DIAGNOSIS — R55 Syncope and collapse: Secondary | ICD-10-CM | POA: Insufficient documentation

## 2022-10-19 ENCOUNTER — Encounter: Payer: Self-pay | Admitting: Internal Medicine

## 2022-10-19 ENCOUNTER — Ambulatory Visit: Payer: Medicare HMO | Attending: Internal Medicine | Admitting: Internal Medicine

## 2022-10-19 VITALS — BP 118/72 | HR 60 | Ht 75.0 in | Wt 259.8 lb

## 2022-10-19 DIAGNOSIS — I5022 Chronic systolic (congestive) heart failure: Secondary | ICD-10-CM

## 2022-10-19 DIAGNOSIS — Z95810 Presence of automatic (implantable) cardiac defibrillator: Secondary | ICD-10-CM

## 2022-10-19 DIAGNOSIS — R55 Syncope and collapse: Secondary | ICD-10-CM

## 2022-10-19 DIAGNOSIS — I4729 Other ventricular tachycardia: Secondary | ICD-10-CM

## 2022-10-19 LAB — CUP PACEART INCLINIC DEVICE CHECK
Date Time Interrogation Session: 20231024173318
HighPow Impedance: 57 Ohm
HighPow Impedance: 68 Ohm
Implantable Lead Connection Status: 753985
Implantable Lead Connection Status: 753985
Implantable Lead Connection Status: 753985
Implantable Lead Implant Date: 20181022
Implantable Lead Implant Date: 20181022
Implantable Lead Implant Date: 20181022
Implantable Lead Location: 753858
Implantable Lead Location: 753859
Implantable Lead Location: 753860
Implantable Lead Model: 293
Implantable Lead Model: 4671
Implantable Lead Model: 7741
Implantable Lead Serial Number: 423377
Implantable Lead Serial Number: 809750
Implantable Lead Serial Number: 954460
Implantable Pulse Generator Implant Date: 20181022
Lead Channel Impedance Value: 435 Ohm
Lead Channel Impedance Value: 643 Ohm
Lead Channel Impedance Value: 942 Ohm
Lead Channel Pacing Threshold Amplitude: 0.1 V
Lead Channel Pacing Threshold Amplitude: 0.5 V
Lead Channel Pacing Threshold Amplitude: 0.7 V
Lead Channel Pacing Threshold Amplitude: 0.8 V
Lead Channel Pacing Threshold Amplitude: 1.3 V
Lead Channel Pacing Threshold Pulse Width: 0.4 ms
Lead Channel Pacing Threshold Pulse Width: 0.4 ms
Lead Channel Pacing Threshold Pulse Width: 0.4 ms
Lead Channel Pacing Threshold Pulse Width: 0.4 ms
Lead Channel Pacing Threshold Pulse Width: 0.4 ms
Lead Channel Sensing Intrinsic Amplitude: 21.6 mV
Lead Channel Sensing Intrinsic Amplitude: 4.5 mV
Lead Channel Sensing Intrinsic Amplitude: 6.4 mV
Lead Channel Setting Pacing Amplitude: 2 V
Lead Channel Setting Pacing Amplitude: 2 V
Lead Channel Setting Pacing Amplitude: 2.6 V
Lead Channel Setting Pacing Pulse Width: 0.4 ms
Lead Channel Setting Pacing Pulse Width: 0.4 ms
Lead Channel Setting Sensing Sensitivity: 0.6 mV
Lead Channel Setting Sensing Sensitivity: 1 mV
Pulse Gen Serial Number: 181086

## 2022-10-19 MED ORDER — AMIODARONE HCL 200 MG PO TABS: ORAL_TABLET | ORAL | 3 refills | Status: DC

## 2022-10-19 NOTE — Progress Notes (Signed)
Patient Care Team: Lillard Anes, MD as PCP - General (Family Medicine) Early Osmond, MD as PCP - Cardiology (Cardiology)   HPI  Bryan Miller is a 71 y.o. male seen in follow-up for ventricular tachycardia in the context of ischemic heart disease and prior bypass surgery with remotely implanted CRT-D  Amio started and ranolazine added for AIVR   The patient denies chest pain, , nocturnal dyspnea , orthopnea  But some SOB and DOE and  peripheral edema .  There have been no palpitations , lightheadedness  or syncope  Patient denies symptoms of respiratory, GI intolerance, sun sensitivity, neurological symptoms attributable to amiodarone.         DATE TEST EF   9/21 Echo   35-40 %   1/23 Echo  40-45%   1/23 LHC  LIMA-LAD p; SVG-RCA-100; OM 50%   Date Cr K Hgb TSH LFTs  2/23 1.08 4.6 14.7 0.116 (levothyroxine dose decreased) 22   9/23 1.12 4.3 15.5 5.9 21     Records and Results Reviewed   Past Medical History:  Diagnosis Date   CAD (coronary artery disease)    Chronic kidney disease, stage 3a ([place])    Chronic systolic CHF (congestive heart failure) ([place])    Dilated aortic root ([place]) 12/05/2018   Hyperlipidemia 08/15/2020   Hypothyroidism    Left bundle branch block 09/14/2019   Old myocardial infarction 06/26/2011   Prediabetes 05/05/2010   Presence of cardiac defibrillator    S/P CABG (coronary artery bypass graft)    Ventricular tachycardia, monomorphic ([place])     Past Surgical History:  Procedure Laterality Date   ABDOMINAL HERNIA REPAIR  2020   bypass  2001   CHOLECYSTECTOMY  1996   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   LEFT HEART CATH AND CORS/GRAFTS ANGIOGRAPHY N/A 01/07/2022   Procedure: LEFT HEART CATH AND CORS/GRAFTS ANGIOGRAPHY;  Surgeon: Belva Crome, MD;  Location: [facility];  Service: Cardiovascular;  Laterality: N/A;    Current Meds  Medication Sig   acetaminophen (TYLENOL) 650 MG CR tablet Take 650 mg by  mouth every 8 (eight) hours as needed for pain.   albuterol (VENTOLIN HFA) 108 (90 Base) MCG/ACT inhaler Inhale into the lungs.   amiodarone (PACERONE) 200 MG tablet Take 1 tablet (200 mg total) by mouth daily.   aspirin EC 81 MG tablet Take by mouth.   benzonatate (TESSALON) 200 MG capsule Take by mouth.   clopidogrel (PLAVIX) 75 MG tablet TAKE 1 TABLET(75 MG) BY MOUTH DAILY   empagliflozin (JARDIANCE) 10 MG TABS tablet Take 1 tablet (10 mg total) by mouth daily before breakfast.   Fluticasone-Umeclidin-Vilant (TRELEGY ELLIPTA) 100-62.5-25 MCG/ACT AEPB Inhale 1 puff into the lungs daily.   furosemide (LASIX) 40 MG tablet TAKE 1 TABLET BY MOUTH EVERY OTHER DAY   isosorbide mononitrate (IMDUR) 30 MG 24 hr tablet TAKE 1 TABLET BY MOUTH EVERY DAY   levothyroxine (SYNTHROID) 88 MCG tablet Take 1 tablet (88 mcg total) by mouth daily before breakfast.   loratadine (CLARITIN) 10 MG tablet Take 10 mg by mouth daily as needed for allergies.   metoprolol succinate (TOPROL-XL) 25 MG 24 hr tablet TAKE 1 TABLET(25 MG) BY MOUTH AT BEDTIME   nitroGLYCERIN (NITROSTAT) 0.4 MG SL tablet Place 0.4 mg under the tongue every 5 (five) minutes as needed for chest pain.   OVER THE COUNTER MEDICATION Place 1 spray into both nostrils as needed (nasal congestion). [employer]  ranolazine (RANEXA) 500 MG 12 hr tablet TAKE 1 TABLET BY MOUTH TWICE DAILY   spironolactone (ALDACTONE) 25 MG tablet Take 0.5 tablets by mouth at bedtime.    No Known Allergies    Review of Systems negative except from HPI and PMH  Physical Exam BP 118/72   Pulse 60   Ht 6\' 3"  (1.905 m)   Wt 259 lb 12.8 oz (117.8 kg)   SpO2 96%   BMI 32.47 kg/m    Well developed and well nourished in no acute distress HENT normal Neck supple with JVP-flat Clear Device pocket well healed; without hematoma or erythema.  There is no tethering  Regular rate and rhythm, no  murmur Abd-soft with active BS No Clubbing cyanosis   edema Skin-warm and dry A &  Oriented  Grossly normal sensory and motor function  ECG avPACING  QRSd  Upright QRS lead V1 and negative QRS lead I  CrCl cannot be calculated (Patient's most recent lab result is older than the maximum 21 days allowed.).   Assessment and  Plan  Ventricular tachycardia-monomorphic-recurrent treated with ATP   Presyncope with the above   Coronary artery disease with prior bypass   Aborted cardiac arrest   CRT-D-[employer]    Systolic heart failure-chronic-systolic  High Risk Medication Surveillance-amiodarone  Mildly volume overloaded.  We will increase his furosemide from 20 daily to 40 daily x3 days and then 40 every other day.  He is supposed to get labs next week so we will follow-up on his amiodarone and his hypothyroidism.  With his cardiomyopathy we will continue Toprol 25 and losartan 12.5 spironolactone 12.5.  His blood pressure is low but he is not having significant orthostatic lightheadedness.  No interval ventricular tachycardia no histogram evidence of AIVR.  We will continue the amiodarone 200 mg a day and ranolazine 500 twice daily   Current medicines are reviewed at length with the patient today .  The patient does not  have concerns regarding medicines.

## 2022-10-19 NOTE — Patient Instructions (Addendum)
Medication Instructions:  Your physician has recommended you make the following change in your medication:   ** Decrease your Amiodarone to 200mg  - 1 tablet by mouth x 5 days per week.  Monday-Friday  *If you need a refill on your cardiac medications before your next appointment, please call your pharmacy*   Lab Work: None ordered.  If you have labs (blood work) drawn today and your tests are completely normal, you will receive your results only by: [place] (if you have MyChart) OR A paper copy in the mail If you have any lab test that is abnormal or we need to change your treatment, we will call you to review the results.   Testing/Procedures: Your physician has requested that you have an echocardiogram. Echocardiography is a painless test that uses sound waves to create images of your heart. It provides your doctor with information about the size and shape of your heart and how well your heart's chambers and valves are working. This procedure takes approximately one hour. There are no restrictions for this procedure. Please do NOT wear cologne, perfume, aftershave, or lotions (deodorant is allowed). Please arrive 15 minutes prior to your appointment time.     Follow-Up: At [facility], you and your health needs are our priority.  As part of our continuing mission to provide you with exceptional heart care, we have created designated Provider Care Teams.  These Care Teams include your primary Cardiologist (physician) and Advanced Practice Providers (APPs -  Physician Assistants and Nurse Practitioners) who all work together to provide you with the care you need, when you need it.  We recommend signing up for the patient portal called "MyChart".  Sign up information is provided on this After Visit Summary.  MyChart is used to connect with patients for Virtual Visits (Telemedicine).  Patients are able to view lab/test results, encounter notes, upcoming appointments, etc.   Non-urgent messages can be sent to your provider as well.   To learn more about what you can do with MyChart, go to [email or web address].    Your next appointment:   6 months with Dr Caryl Comes  Important Information About Sugar

## 2022-10-26 ENCOUNTER — Ambulatory Visit (INDEPENDENT_AMBULATORY_CARE_PROVIDER_SITE_OTHER): Payer: Medicare HMO

## 2022-10-26 DIAGNOSIS — I4729 Other ventricular tachycardia: Secondary | ICD-10-CM | POA: Diagnosis not present

## 2022-10-30 ENCOUNTER — Other Ambulatory Visit: Payer: Self-pay | Admitting: Legal Medicine

## 2022-10-30 DIAGNOSIS — E038 Other specified hypothyroidism: Secondary | ICD-10-CM

## 2022-11-01 ENCOUNTER — Ambulatory Visit (HOSPITAL_COMMUNITY): Payer: Medicare HMO | Attending: Internal Medicine

## 2022-11-01 DIAGNOSIS — I5022 Chronic systolic (congestive) heart failure: Secondary | ICD-10-CM | POA: Insufficient documentation

## 2022-11-01 DIAGNOSIS — I4729 Other ventricular tachycardia: Secondary | ICD-10-CM | POA: Insufficient documentation

## 2022-11-01 LAB — ECHOCARDIOGRAM COMPLETE
Area-P 1/2: 2.5 cm2
S' Lateral: 5.3 cm

## 2022-11-01 MED ORDER — PERFLUTREN LIPID MICROSPHERE
3.0000 mL | INTRAVENOUS | Status: AC | PRN
  Administered 2022-11-01: 3 mL via INTRAVENOUS

## 2022-11-02 ENCOUNTER — Ambulatory Visit (INDEPENDENT_AMBULATORY_CARE_PROVIDER_SITE_OTHER): Payer: Medicare HMO | Admitting: Legal Medicine

## 2022-11-02 ENCOUNTER — Encounter: Payer: Self-pay | Admitting: Legal Medicine

## 2022-11-02 VITALS — BP 120/60 | HR 77 | Temp 97.7°F | Resp 18 | Ht 75.0 in | Wt 264.0 lb

## 2022-11-02 DIAGNOSIS — J018 Other acute sinusitis: Secondary | ICD-10-CM | POA: Diagnosis not present

## 2022-11-02 MED ORDER — AMOXICILLIN-POT CLAVULANATE 875-125 MG PO TABS: 1.0000 | ORAL_TABLET | Freq: Two times a day (BID) | ORAL | 0 refills | Status: DC

## 2022-11-02 MED ORDER — TRIAMCINOLONE ACETONIDE 40 MG/ML IJ SUSP
40.0000 mg | Freq: Once | INTRAMUSCULAR | Status: AC
  Administered 2022-11-02: 40 mg via INTRA_ARTICULAR

## 2022-11-02 NOTE — Progress Notes (Unsigned)
Subjective:  Patient ID: Bryan Miller, male    DOB: 04/01/1951  Age: 71 y.o. MRN: [id number]  Chief Complaint  Patient presents with   Cough   Nasal Congestion    HPI: 71 yo male with congestion. At urgent care antibiotic and cough medicine 2 weeks ago. Teaches at school.  He is sicker last 3 days.  He is going to [place].   Current Outpatient Medications on File Prior to Visit  Medication Sig Dispense Refill   acetaminophen (TYLENOL) 650 MG CR tablet Take 650 mg by mouth every 8 (eight) hours as needed for pain.     albuterol (VENTOLIN HFA) 108 (90 Base) MCG/ACT inhaler Inhale into the lungs.     amiodarone (PACERONE) 200 MG tablet Take 1 tablet by mouth 5 days per week ( Monday-Friday) 90 tablet 3   aspirin EC 81 MG tablet Take by mouth.     atorvastatin (LIPITOR) 40 MG tablet TAKE 1 TABLET(40 MG) BY MOUTH DAILY 90 tablet 2   clopidogrel (PLAVIX) 75 MG tablet TAKE 1 TABLET(75 MG) BY MOUTH DAILY 90 tablet 2   empagliflozin (JARDIANCE) 10 MG TABS tablet Take 1 tablet (10 mg total) by mouth daily before breakfast. 90 tablet 3   Fluticasone-Umeclidin-Vilant (TRELEGY ELLIPTA) 100-62.5-25 MCG/ACT AEPB Inhale 1 puff into the lungs daily. 1 each 11   furosemide (LASIX) 40 MG tablet TAKE 1 TABLET BY MOUTH EVERY OTHER DAY 90 tablet 3   isosorbide mononitrate (IMDUR) 30 MG 24 hr tablet TAKE 1 TABLET BY MOUTH EVERY DAY 90 tablet 2   levothyroxine (SYNTHROID) 88 MCG tablet TAKE 1 TABLET(88 MCG) BY MOUTH DAILY 90 tablet 2   loratadine (CLARITIN) 10 MG tablet Take 10 mg by mouth daily as needed for allergies.     metoprolol succinate (TOPROL-XL) 25 MG 24 hr tablet TAKE 1 TABLET(25 MG) BY MOUTH AT BEDTIME 90 tablet 3   nitroGLYCERIN (NITROSTAT) 0.4 MG SL tablet Place 0.4 mg under the tongue every 5 (five) minutes as needed for chest pain.     OVER THE COUNTER MEDICATION Place 1 spray into both nostrils as needed (nasal congestion). Xytiol     ranolazine (RANEXA) 500 MG 12 hr tablet TAKE 1 TABLET  BY MOUTH TWICE DAILY 180 tablet 3   spironolactone (ALDACTONE) 25 MG tablet Take 0.5 tablets by mouth at bedtime.     losartan (COZAAR) 25 MG tablet Take 0.5 tablets (12.5 mg total) by mouth at bedtime. 45 tablet 3   No current facility-administered medications on file prior to visit.   Past Medical History:  Diagnosis Date   CAD (coronary artery disease)    Chronic kidney disease, stage 3a ([facility])    Chronic systolic CHF (congestive heart failure) ([facility])    Dilated aortic root ([facility]) 12/05/2018   Hyperlipidemia 08/15/2020   Hypothyroidism    Left bundle branch block 09/14/2019   Old myocardial infarction 06/26/2011   Prediabetes 05/05/2010   Presence of cardiac defibrillator    S/P CABG (coronary artery bypass graft)    Ventricular tachycardia, monomorphic ([facility])    Past Surgical History:  Procedure Laterality Date   ABDOMINAL HERNIA REPAIR  2020   bypass  2001   CHOLECYSTECTOMY  1996   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   LEFT HEART CATH AND CORS/GRAFTS ANGIOGRAPHY N/A 01/07/2022   Procedure: LEFT HEART CATH AND CORS/GRAFTS ANGIOGRAPHY;  Surgeon: Lyn Records, MD;  Location: [facility];  Service: Cardiovascular;  Laterality: N/A;    Family  History  Problem Relation Age of Onset   Dementia Mother    Heart Problems Mother    Heart Problems Father    Social History   Socioeconomic History   Marital status: Married    Spouse name: Darl Pikes   Number of children: 3   Years of education: Not on file   Highest education level: Not on file  Occupational History   Not on file  Tobacco Use   Smoking status: Former    Types: Cigarettes    Quit date: 1970    Years since quitting: 53.8   Smokeless tobacco: Never  Vaping Use   Vaping Use: Never used  Substance and Sexual Activity   Alcohol use: Not Currently   Drug use: Never   Sexual activity: Yes    Partners: Female  Other Topics Concern   Not on file  Social History Narrative   Retired - currently  Armed forces operational officer at [employer].  Lives with wife, moved back to [place] from [place] a couple if years ago.  Has one son and two daughters.   Social Determinants of Health   Financial Resource Strain: Not on file  Food Insecurity: No Food Insecurity (07/21/2022)   Hunger Vital Sign    Worried About Running Out of Food in the Last Year: Never true    Ran Out of Food in the Last Year: Never true  Transportation Needs: No Transportation Needs (07/21/2022)   PRAPARE - Administrator, Civil Service (Medical): No    Lack of Transportation (Non-Medical): No  Physical Activity: Sufficiently Active (07/21/2022)   Exercise Vital Sign    Days of Exercise per Week: 7 days    Minutes of Exercise per Session: 60 min  Stress: No Stress Concern Present (07/21/2022)   [employer] of Occupational Health - Occupational Stress Questionnaire    Feeling of Stress : Not at all  Social Connections: Not on file    Review of Systems  Constitutional:  Negative for chills, fatigue and fever.  HENT:  Positive for congestion.   Eyes:  Negative for visual disturbance.  Respiratory:  Positive for cough and chest tightness. Negative for shortness of breath.   Cardiovascular:  Negative for chest pain.  Gastrointestinal:  Negative for abdominal pain, constipation and diarrhea.  Endocrine: Negative.   Genitourinary: Negative.   Neurological:  Positive for headaches.     Objective:  BP 120/60   Pulse 77   Temp 97.7 F (36.5 C)   Resp 18   Ht 6\' 3"  (1.905 m)   Wt 264 lb (119.7 kg)   SpO2 97%   BMI 33.00 kg/m      11/02/2022    1:54 PM 10/19/2022    2:01 PM 09/29/2022    8:18 AM  BP/Weight  Systolic BP 120 118 94  Diastolic BP 60 72 60  Wt. (Lbs) 264 259.8 258  BMI 33 kg/m2 32.47 kg/m2 32.25 kg/m2    Physical Exam Vitals reviewed.  Constitutional:      Appearance: Normal appearance. He is ill-appearing.  HENT:     Head: Normocephalic and atraumatic.     Right Ear: Tympanic membrane normal.      Left Ear: Tympanic membrane normal.     Nose: Congestion present.     Mouth/Throat:     Mouth: Mucous membranes are moist.     Pharynx: Oropharynx is clear.  Eyes:     Extraocular Movements: Extraocular movements intact.     Conjunctiva/sclera: Conjunctivae normal.  Pupils: Pupils are equal, round, and reactive to light.  Cardiovascular:     Rate and Rhythm: Normal rate and regular rhythm.     Pulses: Normal pulses.     Heart sounds: Normal heart sounds. No murmur heard.    No gallop.  Pulmonary:     Effort: Pulmonary effort is normal.     Breath sounds: Rhonchi present.  Abdominal:     General: Abdomen is flat. Bowel sounds are normal. There is no distension.     Palpations: Abdomen is soft.     Tenderness: There is no abdominal tenderness.  Musculoskeletal:        General: Normal range of motion.     Cervical back: Normal range of motion.     Right lower leg: No edema.     Left lower leg: No edema.  Skin:    General: Skin is warm.     Capillary Refill: Capillary refill takes less than 2 seconds.  Neurological:     General: No focal deficit present.     Mental Status: He is alert and oriented to person, place, and time. Mental status is at baseline.     Gait: Gait normal.     Deep Tendon Reflexes: Reflexes normal.  Psychiatric:        Mood and Affect: Mood normal.         Lab Results  Component Value Date   WBC 11.3 (H) 09/22/2022   HGB 15.5 09/22/2022   HCT 46.5 09/22/2022   PLT 248 09/22/2022   GLUCOSE 97 09/22/2022   CHOL 217 (H) 09/22/2022   TRIG 195 (H) 09/22/2022   HDL 40 09/22/2022   LDLCALC 142 (H) 09/22/2022   ALT 21 09/22/2022   AST 21 09/22/2022   NA 140 09/22/2022   K 4.3 09/22/2022   CL 100 09/22/2022   CREATININE 1.12 09/22/2022   BUN 15 09/22/2022   CO2 25 09/22/2022   TSH 5.900 (H) 09/22/2022   HGBA1C 5.6 09/22/2022      Assessment & Plan:   Problem List Items Addressed This Visit       Respiratory   Acute sinusitis -  Primary  .       Follow-up: Return if symptoms worsen or fail to improve.  An After Visit Summary was printed and given to the patient.  Brent Bulla, MD [facility] [phone]

## 2022-11-05 ENCOUNTER — Other Ambulatory Visit: Payer: Self-pay

## 2022-11-06 MED ORDER — SPIRONOLACTONE 25 MG PO TABS: 12.5000 mg | ORAL_TABLET | Freq: Every day | ORAL | 0 refills | Status: DC

## 2022-11-15 ENCOUNTER — Encounter: Payer: Self-pay | Admitting: Internal Medicine

## 2022-11-19 LAB — CUP PACEART REMOTE DEVICE CHECK
Battery Remaining Longevity: 72 mo
Battery Remaining Percentage: 97 %
Brady Statistic RA Percent Paced: 5 %
Brady Statistic RV Percent Paced: 99 %
Date Time Interrogation Session: 20231102004100
HighPow Impedance: 69 Ohm
Implantable Lead Connection Status: 753985
Implantable Lead Connection Status: 753985
Implantable Lead Connection Status: 753985
Implantable Lead Implant Date: 20181022
Implantable Lead Implant Date: 20181022
Implantable Lead Implant Date: 20181022
Implantable Lead Location: 753858
Implantable Lead Location: 753859
Implantable Lead Location: 753860
Implantable Lead Model: 293
Implantable Lead Model: 4671
Implantable Lead Model: 7741
Implantable Lead Serial Number: 423377
Implantable Lead Serial Number: 809750
Implantable Lead Serial Number: 954460
Implantable Pulse Generator Implant Date: 20181022
Lead Channel Impedance Value: 437 Ohm
Lead Channel Impedance Value: 648 Ohm
Lead Channel Impedance Value: 938 Ohm
Lead Channel Pacing Threshold Amplitude: 0.1 V
Lead Channel Pacing Threshold Amplitude: 0.7 V
Lead Channel Pacing Threshold Amplitude: 1.3 V
Lead Channel Pacing Threshold Pulse Width: 0.4 ms
Lead Channel Pacing Threshold Pulse Width: 0.4 ms
Lead Channel Pacing Threshold Pulse Width: 0.4 ms
Lead Channel Setting Pacing Amplitude: 2 V
Lead Channel Setting Pacing Amplitude: 2 V
Lead Channel Setting Pacing Amplitude: 2.6 V
Lead Channel Setting Pacing Pulse Width: 0.4 ms
Lead Channel Setting Pacing Pulse Width: 0.4 ms
Lead Channel Setting Sensing Sensitivity: 0.6 mV
Lead Channel Setting Sensing Sensitivity: 1 mV
Pulse Gen Serial Number: 181086

## 2022-11-22 ENCOUNTER — Other Ambulatory Visit: Payer: Self-pay | Admitting: Legal Medicine

## 2022-11-22 DIAGNOSIS — E038 Other specified hypothyroidism: Secondary | ICD-10-CM

## 2022-11-27 NOTE — Progress Notes (Signed)
Remote ICD transmission.   

## 2022-12-07 ENCOUNTER — Encounter: Payer: Self-pay | Admitting: Family Medicine

## 2022-12-07 ENCOUNTER — Ambulatory Visit (INDEPENDENT_AMBULATORY_CARE_PROVIDER_SITE_OTHER): Payer: Medicare HMO | Admitting: Family Medicine

## 2022-12-07 VITALS — BP 110/64 | HR 72 | Temp 97.1°F | Resp 18 | Ht 63.0 in | Wt 268.0 lb

## 2022-12-07 DIAGNOSIS — L720 Epidermal cyst: Secondary | ICD-10-CM | POA: Diagnosis not present

## 2022-12-07 MED ORDER — METOPROLOL SUCCINATE ER 25 MG PO TB24: ORAL_TABLET | ORAL | 0 refills | Status: DC

## 2022-12-07 MED ORDER — CLOPIDOGREL BISULFATE 75 MG PO TABS: ORAL_TABLET | ORAL | 0 refills | Status: DC

## 2022-12-07 MED ORDER — FUROSEMIDE 40 MG PO TABS: 40.0000 mg | ORAL_TABLET | ORAL | 0 refills | Status: DC

## 2022-12-07 MED ORDER — NITROGLYCERIN 0.4 MG SL SUBL: 0.4000 mg | SUBLINGUAL_TABLET | SUBLINGUAL | 0 refills | Status: DC | PRN

## 2022-12-07 NOTE — Progress Notes (Signed)
Acute Office Visit  Subjective:    Patient ID: Bryan Miller, male    DOB: April 04, 1951, 71 y.o.   MRN: [id number]  Chief Complaint  Patient presents with   Mass    Right shoulder blade area.      HPI: Patient is in today for EIC. Left upper back. Sometimes little painful. No fever. No redness. No drainage.  Patient has numerous medical issues and is scheduled to return in 2 months.   Past Medical History:  Diagnosis Date   CAD (coronary artery disease)    Chronic kidney disease, stage 3a ([facility])    Chronic systolic CHF (congestive heart failure) ([facility])    Dilated aortic root ([facility]) 12/05/2018   Hyperlipidemia 08/15/2020   Hypothyroidism    Left bundle branch block 09/14/2019   Old myocardial infarction 06/26/2011   Prediabetes 05/05/2010   Presence of cardiac defibrillator    S/P CABG (coronary artery bypass graft)    Ventricular tachycardia, monomorphic ([facility])     Past Surgical History:  Procedure Laterality Date   ABDOMINAL HERNIA REPAIR  2020   bypass  2001   CHOLECYSTECTOMY  1996   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   LEFT HEART CATH AND CORS/GRAFTS ANGIOGRAPHY N/A 01/07/2022   Procedure: LEFT HEART CATH AND CORS/GRAFTS ANGIOGRAPHY;  Surgeon: Lyn Records, MD;  [facility];  Service: Cardiovascular;  Laterality: N/A;    Family History  Problem Relation Age of Onset   Dementia Mother    Heart Problems Mother    Heart Problems Father     Social History   Socioeconomic History   Marital status: Married    Spouse name: Darl Pikes   Number of children: 3   Years of education: Not on file   Highest education level: Not on file  Occupational History   Not on file  Tobacco Use   Smoking status: Former    Types: Cigarettes    Quit date: 1970    Years since quitting: 53.9   Smokeless tobacco: Never  Vaping Use   Vaping Use: Never used  Substance and Sexual Activity   Alcohol use: Not Currently   Drug use: Never   Sexual activity:  Yes    Partners: Female  Other Topics Concern   Not on file  Social History Narrative   Retired - currently Armed forces operational officer at [employer].  Lives with wife, moved back to [place] from [place] a couple if years ago.  Has one son and two daughters.   Social Determinants of Health   Financial Resource Strain: Not on file  Food Insecurity: No Food Insecurity (07/21/2022)   Hunger Vital Sign    Worried About Running Out of Food in the Last Year: Never true    Ran Out of Food in the Last Year: Never true  Transportation Needs: No Transportation Needs (07/21/2022)   PRAPARE - Administrator, Civil Service (Medical): No    Lack of Transportation (Non-Medical): No  Physical Activity: Sufficiently Active (07/21/2022)   Exercise Vital Sign    Days of Exercise per Week: 7 days    Minutes of Exercise per Session: 60 min  Stress: No Stress Concern Present (07/21/2022)   [employer] of Occupational Health - Occupational Stress Questionnaire    Feeling of Stress : Not at all  Social Connections: Not on file  Intimate Partner Violence: Not At Risk (07/21/2022)   Humiliation, Afraid, Rape, and Kick questionnaire    Fear of Current  or Ex-Partner: No    Emotionally Abused: No    Physically Abused: No    Sexually Abused: No    Outpatient Medications Prior to Visit  Medication Sig Dispense Refill   acetaminophen (TYLENOL) 650 MG CR tablet Take 650 mg by mouth every 8 (eight) hours as needed for pain.     albuterol (VENTOLIN HFA) 108 (90 Base) MCG/ACT inhaler Inhale into the lungs.     amiodarone (PACERONE) 200 MG tablet Take 1 tablet by mouth 5 days per week ( Monday-Friday) 90 tablet 3   aspirin EC 81 MG tablet Take by mouth.     atorvastatin (LIPITOR) 40 MG tablet TAKE 1 TABLET(40 MG) BY MOUTH DAILY 90 tablet 2   levothyroxine (SYNTHROID) 88 MCG tablet TAKE 1 TABLET(88 MCG) BY MOUTH DAILY BEFORE BREAKFAST 30 tablet 6   loratadine (CLARITIN) 10 MG tablet Take 10 mg by mouth daily as needed for  allergies.     ranolazine (RANEXA) 500 MG 12 hr tablet TAKE 1 TABLET BY MOUTH TWICE DAILY 180 tablet 3   spironolactone (ALDACTONE) 25 MG tablet Take 0.5 tablets (12.5 mg total) by mouth at bedtime. 90 tablet 0   clopidogrel (PLAVIX) 75 MG tablet TAKE 1 TABLET(75 MG) BY MOUTH DAILY 90 tablet 2   furosemide (LASIX) 40 MG tablet TAKE 1 TABLET BY MOUTH EVERY OTHER DAY 90 tablet 3   metoprolol succinate (TOPROL-XL) 25 MG 24 hr tablet TAKE 1 TABLET(25 MG) BY MOUTH AT BEDTIME 90 tablet 3   nitroGLYCERIN (NITROSTAT) 0.4 MG SL tablet Place 0.4 mg under the tongue every 5 (five) minutes as needed for chest pain.     empagliflozin (JARDIANCE) 10 MG TABS tablet Take 1 tablet (10 mg total) by mouth daily before breakfast. (Patient not taking: Reported on 12/07/2022) 90 tablet 3   Fluticasone-Umeclidin-Vilant (TRELEGY ELLIPTA) 100-62.5-25 MCG/ACT AEPB Inhale 1 puff into the lungs daily. (Patient not taking: Reported on 12/07/2022) 1 each 11   OVER THE COUNTER MEDICATION Place 1 spray into both nostrils as needed (nasal congestion). Xytiol (Patient not taking: Reported on 12/07/2022)     amoxicillin-clavulanate (AUGMENTIN) 875-125 MG tablet Take 1 tablet by mouth 2 (two) times daily. 20 tablet 0   isosorbide mononitrate (IMDUR) 30 MG 24 hr tablet TAKE 1 TABLET BY MOUTH EVERY DAY 90 tablet 2   losartan (COZAAR) 25 MG tablet Take 0.5 tablets (12.5 mg total) by mouth at bedtime. 45 tablet 3   No facility-administered medications prior to visit.    No Known Allergies  Review of Systems  Constitutional:  Negative for chills and fever.  HENT:  Negative for congestion.   Cardiovascular:  Negative for chest pain.  Genitourinary:  Negative for dysuria.  Skin:        Mass on back   Neurological:  Negative for headaches.  Psychiatric/Behavioral:  Negative for dysphoric mood. The patient is not nervous/anxious.        Objective:    Physical Exam Vitals reviewed.  Constitutional:      Appearance: Normal  appearance.  Skin:    Findings: No lesion (EIC 3 cm in diameter. nontender. No erythema.).  Neurological:     Mental Status: He is alert.     BP 110/64   Pulse 72   Temp (!) 97.1 F (36.2 C)   Resp 18   Ht 5\' 3"  (1.6 m)   Wt 268 lb (121.6 kg)   BMI 47.47 kg/m  Wt Readings from Last 3 Encounters:  12/07/22 268 lb (  121.6 kg)  11/02/22 264 lb (119.7 kg)  10/19/22 259 lb 12.8 oz (117.8 kg)    Health Maintenance Due  Topic Date Due   Hepatitis C Screening  Never done   Zoster Vaccines- Shingrix (1 of 2) Never done   Fecal DNA (Cologuard)  Never done    There are no preventive care reminders to display for this patient.   Lab Results  Component Value Date   TSH 5.900 (H) 09/22/2022   Lab Results  Component Value Date   WBC 11.3 (H) 09/22/2022   HGB 15.5 09/22/2022   HCT 46.5 09/22/2022   MCV 96 09/22/2022   PLT 248 09/22/2022   Lab Results  Component Value Date   NA 140 09/22/2022   K 4.3 09/22/2022   CO2 25 09/22/2022   GLUCOSE 97 09/22/2022   BUN 15 09/22/2022   CREATININE 1.12 09/22/2022   BILITOT 0.6 09/22/2022   ALKPHOS 115 09/22/2022   AST 21 09/22/2022   ALT 21 09/22/2022   PROT 6.8 09/22/2022   ALBUMIN 4.1 09/22/2022   CALCIUM 9.2 09/22/2022   ANIONGAP 7 01/10/2022   EGFR 70 09/22/2022   Lab Results  Component Value Date   CHOL 217 (H) 09/22/2022   Lab Results  Component Value Date   HDL 40 09/22/2022   Lab Results  Component Value Date   LDLCALC 142 (H) 09/22/2022   Lab Results  Component Value Date   TRIG 195 (H) 09/22/2022   Lab Results  Component Value Date   CHOLHDL 5.4 (H) 09/22/2022   Lab Results  Component Value Date   HGBA1C 5.6 09/22/2022       Assessment & Plan:   Problem List Items Addressed This Visit       Other   Epidermal inclusion cyst - Primary   Relevant Orders   Ambulatory referral to General Surgery   Meds ordered this encounter  Medications   metoprolol succinate (TOPROL-XL) 25 MG 24 hr tablet     Sig: TAKE 1 TABLET(25 MG) BY MOUTH AT BEDTIME    Dispense:  90 tablet    Refill:  0    **Patient requests 90 days supply**   nitroGLYCERIN (NITROSTAT) 0.4 MG SL tablet    Sig: Place 1 tablet (0.4 mg total) under the tongue every 5 (five) minutes as needed for chest pain.    Dispense:  25 tablet    Refill:  0   clopidogrel (PLAVIX) 75 MG tablet    Sig: TAKE 1 TABLET(75 MG) BY MOUTH DAILY    Dispense:  90 tablet    Refill:  0   furosemide (LASIX) 40 MG tablet    Sig: Take 1 tablet (40 mg total) by mouth every other day.    Dispense:  90 tablet    Refill:  0    Orders Placed This Encounter  Procedures   Ambulatory referral to General Surgery     Follow-up: No follow-ups on file.  An After Visit Summary was printed and given to the patient.  Blane Ohara, MD [facility] [phone]

## 2022-12-07 NOTE — Progress Notes (Signed)
Acute Office Visit  Subjective:    Patient ID: Bryan Miller, male    DOB: 11/22/1951, 71 y.o.   MRN: [id number]  CHIEF COMPLAINT: Abscess on back  HPI: Patient is in today for   Past Medical History:  Diagnosis Date   CAD (coronary artery disease)    Chronic kidney disease, stage 3a (HCC)    Chronic systolic CHF (congestive heart failure) (HCC)    Dilated aortic root (HCC) 12/05/2018   Hyperlipidemia 08/15/2020   Hypothyroidism    Left bundle branch block 09/14/2019   Old myocardial infarction 06/26/2011   Prediabetes 05/05/2010   Presence of cardiac defibrillator    S/P CABG (coronary artery bypass graft)    Ventricular tachycardia, monomorphic (HCC)     Past Surgical History:  Procedure Laterality Date   ABDOMINAL HERNIA REPAIR  2020   bypass  2001   CHOLECYSTECTOMY  1996   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   LEFT HEART CATH AND CORS/GRAFTS ANGIOGRAPHY N/A 01/07/2022   Procedure: LEFT HEART CATH AND CORS/GRAFTS ANGIOGRAPHY;  Surgeon: Lyn Records, MD;  Location: [facility];  Service: Cardiovascular;  Laterality: N/A;    Family History  Problem Relation Age of Onset   Dementia Mother    Heart Problems Mother    Heart Problems Father     Social History   Socioeconomic History   Marital status: Married    Spouse name: Darl Pikes   Number of children: 3   Years of education: Not on file   Highest education level: Not on file  Occupational History   Not on file  Tobacco Use   Smoking status: Former    Types: Cigarettes    Quit date: 1970    Years since quitting: 53.9   Smokeless tobacco: Never  Vaping Use   Vaping Use: Never used  Substance and Sexual Activity   Alcohol use: Not Currently   Drug use: Never   Sexual activity: Yes    Partners: Female  Other Topics Concern   Not on file  Social History Narrative   Retired - currently Armed forces operational officer at [employer].  Lives with wife, moved back to [place] from [place] a couple if years ago.  Has  one son and two daughters.   Social Determinants of Health   Financial Resource Strain: Not on file  Food Insecurity: No Food Insecurity (07/21/2022)   Hunger Vital Sign    Worried About Running Out of Food in the Last Year: Never true    Ran Out of Food in the Last Year: Never true  Transportation Needs: No Transportation Needs (07/21/2022)   PRAPARE - Administrator, Civil Service (Medical): No    Lack of Transportation (Non-Medical): No  Physical Activity: Sufficiently Active (07/21/2022)   Exercise Vital Sign    Days of Exercise per Week: 7 days    Minutes of Exercise per Session: 60 min  Stress: No Stress Concern Present (07/21/2022)   [employer] of Occupational Health - Occupational Stress Questionnaire    Feeling of Stress : Not at all  Social Connections: Not on file  Intimate Partner Violence: Not At Risk (07/21/2022)   Humiliation, Afraid, Rape, and Kick questionnaire    Fear of Current or Ex-Partner: No    Emotionally Abused: No    Physically Abused: No    Sexually Abused: No    Outpatient Medications Prior to Visit  Medication Sig Dispense Refill   acetaminophen (TYLENOL) 650 MG CR tablet  Take 650 mg by mouth every 8 (eight) hours as needed for pain.     albuterol (VENTOLIN HFA) 108 (90 Base) MCG/ACT inhaler Inhale into the lungs.     amiodarone (PACERONE) 200 MG tablet Take 1 tablet by mouth 5 days per week ( Monday-Friday) 90 tablet 3   amoxicillin-clavulanate (AUGMENTIN) 875-125 MG tablet Take 1 tablet by mouth 2 (two) times daily. 20 tablet 0   aspirin EC 81 MG tablet Take by mouth.     atorvastatin (LIPITOR) 40 MG tablet TAKE 1 TABLET(40 MG) BY MOUTH DAILY 90 tablet 2   clopidogrel (PLAVIX) 75 MG tablet TAKE 1 TABLET(75 MG) BY MOUTH DAILY 90 tablet 2   empagliflozin (JARDIANCE) 10 MG TABS tablet Take 1 tablet (10 mg total) by mouth daily before breakfast. 90 tablet 3   Fluticasone-Umeclidin-Vilant (TRELEGY ELLIPTA) 100-62.5-25 MCG/ACT AEPB Inhale 1  puff into the lungs daily. 1 each 11   furosemide (LASIX) 40 MG tablet TAKE 1 TABLET BY MOUTH EVERY OTHER DAY 90 tablet 3   isosorbide mononitrate (IMDUR) 30 MG 24 hr tablet TAKE 1 TABLET BY MOUTH EVERY DAY 90 tablet 2   levothyroxine (SYNTHROID) 88 MCG tablet TAKE 1 TABLET(88 MCG) BY MOUTH DAILY BEFORE BREAKFAST 30 tablet 6   loratadine (CLARITIN) 10 MG tablet Take 10 mg by mouth daily as needed for allergies.     losartan (COZAAR) 25 MG tablet Take 0.5 tablets (12.5 mg total) by mouth at bedtime. 45 tablet 3   metoprolol succinate (TOPROL-XL) 25 MG 24 hr tablet TAKE 1 TABLET(25 MG) BY MOUTH AT BEDTIME 90 tablet 3   nitroGLYCERIN (NITROSTAT) 0.4 MG SL tablet Place 0.4 mg under the tongue every 5 (five) minutes as needed for chest pain.     OVER THE COUNTER MEDICATION Place 1 spray into both nostrils as needed (nasal congestion). Xytiol     ranolazine (RANEXA) 500 MG 12 hr tablet TAKE 1 TABLET BY MOUTH TWICE DAILY 180 tablet 3   spironolactone (ALDACTONE) 25 MG tablet Take 0.5 tablets (12.5 mg total) by mouth at bedtime. 90 tablet 0   No facility-administered medications prior to visit.    No Known Allergies  Review of Systems     Objective:    Physical Exam  There were no vitals taken for this visit. Wt Readings from Last 3 Encounters:  11/02/22 264 lb (119.7 kg)  10/19/22 259 lb 12.8 oz (117.8 kg)  09/29/22 258 lb (117 kg)    Health Maintenance Due  Topic Date Due   Hepatitis C Screening  Never done   Zoster Vaccines- Shingrix (1 of 2) Never done   Fecal DNA (Cologuard)  Never done    There are no preventive care reminders to display for this patient.   Lab Results  Component Value Date   TSH 5.900 (H) 09/22/2022   Lab Results  Component Value Date   WBC 11.3 (H) 09/22/2022   HGB 15.5 09/22/2022   HCT 46.5 09/22/2022   MCV 96 09/22/2022   PLT 248 09/22/2022   Lab Results  Component Value Date   NA 140 09/22/2022   K 4.3 09/22/2022   CO2 25 09/22/2022    GLUCOSE 97 09/22/2022   BUN 15 09/22/2022   CREATININE 1.12 09/22/2022   BILITOT 0.6 09/22/2022   ALKPHOS 115 09/22/2022   AST 21 09/22/2022   ALT 21 09/22/2022   PROT 6.8 09/22/2022   ALBUMIN 4.1 09/22/2022   CALCIUM 9.2 09/22/2022   ANIONGAP 7 01/10/2022  EGFR 70 09/22/2022   Lab Results  Component Value Date   CHOL 217 (H) 09/22/2022   Lab Results  Component Value Date   HDL 40 09/22/2022   Lab Results  Component Value Date   LDLCALC 142 (H) 09/22/2022   Lab Results  Component Value Date   TRIG 195 (H) 09/22/2022   Lab Results  Component Value Date   CHOLHDL 5.4 (H) 09/22/2022   Lab Results  Component Value Date   HGBA1C 5.6 09/22/2022       Assessment & Plan:   Problem List Items Addressed This Visit   None  No orders of the defined types were placed in this encounter.   No orders of the defined types were placed in this encounter.    Follow-up: No follow-ups on file.  An After Visit Summary was printed and given to the patient.  Lurline Del, DNP, FNP-BC, FNP-C [facility] [phone]

## 2022-12-27 ENCOUNTER — Encounter: Payer: Self-pay | Admitting: Family Medicine

## 2022-12-29 NOTE — Telephone Encounter (Signed)
Called the surgeon's office and they are calling the patient to get him scheduled. Patient was informed of this!

## 2022-12-30 DIAGNOSIS — L723 Sebaceous cyst: Secondary | ICD-10-CM | POA: Diagnosis not present

## 2022-12-30 DIAGNOSIS — L089 Local infection of the skin and subcutaneous tissue, unspecified: Secondary | ICD-10-CM | POA: Diagnosis not present

## 2023-01-25 ENCOUNTER — Ambulatory Visit: Payer: Medicare HMO | Admitting: Family Medicine

## 2023-01-27 ENCOUNTER — Encounter: Payer: Self-pay | Admitting: Family Medicine

## 2023-01-27 ENCOUNTER — Ambulatory Visit (INDEPENDENT_AMBULATORY_CARE_PROVIDER_SITE_OTHER): Payer: Medicare HMO | Admitting: Family Medicine

## 2023-01-27 VITALS — BP 90/58 | HR 56 | Temp 97.3°F | Ht 75.0 in | Wt 269.0 lb

## 2023-01-27 DIAGNOSIS — E6609 Other obesity due to excess calories: Secondary | ICD-10-CM | POA: Diagnosis not present

## 2023-01-27 DIAGNOSIS — Z6833 Body mass index (BMI) 33.0-33.9, adult: Secondary | ICD-10-CM

## 2023-01-27 DIAGNOSIS — E782 Mixed hyperlipidemia: Secondary | ICD-10-CM

## 2023-01-27 DIAGNOSIS — R7303 Prediabetes: Secondary | ICD-10-CM

## 2023-01-27 DIAGNOSIS — I472 Ventricular tachycardia, unspecified: Secondary | ICD-10-CM | POA: Diagnosis not present

## 2023-01-27 DIAGNOSIS — I420 Dilated cardiomyopathy: Secondary | ICD-10-CM

## 2023-01-27 DIAGNOSIS — I5022 Chronic systolic (congestive) heart failure: Secondary | ICD-10-CM | POA: Diagnosis not present

## 2023-01-27 DIAGNOSIS — N1831 Chronic kidney disease, stage 3a: Secondary | ICD-10-CM | POA: Diagnosis not present

## 2023-01-27 DIAGNOSIS — E063 Autoimmune thyroiditis: Secondary | ICD-10-CM

## 2023-01-27 DIAGNOSIS — L209 Atopic dermatitis, unspecified: Secondary | ICD-10-CM

## 2023-01-27 DIAGNOSIS — I25119 Atherosclerotic heart disease of native coronary artery with unspecified angina pectoris: Secondary | ICD-10-CM

## 2023-01-27 DIAGNOSIS — Z95810 Presence of automatic (implantable) cardiac defibrillator: Secondary | ICD-10-CM | POA: Diagnosis not present

## 2023-01-27 DIAGNOSIS — E038 Other specified hypothyroidism: Secondary | ICD-10-CM | POA: Diagnosis not present

## 2023-01-27 MED ORDER — FUROSEMIDE 40 MG PO TABS: 40.0000 mg | ORAL_TABLET | Freq: Every day | ORAL | 1 refills | Status: DC

## 2023-01-27 MED ORDER — EMPAGLIFLOZIN 25 MG PO TABS: 25.0000 mg | ORAL_TABLET | Freq: Every day | ORAL | 3 refills | Status: DC

## 2023-01-27 NOTE — Assessment & Plan Note (Addendum)
Medicines: Jardiance 25 mg daily. Continue to work on eating a healthy diet and exercise.  Labs drawn today.

## 2023-01-27 NOTE — Assessment & Plan Note (Addendum)
Well controlled.  No changes to medicines. Atorvastatin 40 mg daily Continue to work on eating a healthy diet and exercise.  Return for fasting labs.

## 2023-01-27 NOTE — Progress Notes (Unsigned)
Subjective:  Patient ID: Bryan Miller, male    DOB: [birth year]  Age: 72 y.o. MRN: [id number]  Chief Complaint  Patient presents with   Hypothyroidism   Hyperlipidemia   Prediabetes   Congestive Heart Failure    HPI: Patient presents with hyperlipidemia.  Low cholesterol diet and maintains exercise regimen.  Patient is using Atorvastatin 40 mg daily without problems.     Hypothyroidism: Taking Levothyroxine 88 mcg daily.   Diabetes: not Taking Jardiance 10 mg daily.    CHF: He takes spironolactone 25 mg 1/2 tablet daily, lasix 40 mg daily.  CORONARY ARTERY DISEASE: on aspirin 81 mg daily, plavix 75 mg daily, ntg SL, toprol xl 25 mg daily, and ranolazine 500 mg twice daily.   Ventricular tachycardia: Currently on amiodarone 200 daily Monday through Friday. Not taking on the weekend. Per patient, Dr. Graciela Husbands is trying to wean him down as his EF improves. On toprol xl 25 mg daily. .     01/27/2023    3:54 PM 07/21/2022   10:52 AM 04/28/2022    9:48 AM 03/18/2021    3:08 PM 03/18/2021    3:03 PM  Depression screen PHQ 2/9  Decreased Interest 0 0 0 0 0  Down, Depressed, Hopeless 0 0 0 0 0  PHQ - 2 Score 0 0 0 0 0         01/09/2022    8:00 AM 01/09/2022    9:22 PM 04/28/2022    9:47 AM 07/21/2022   10:54 AM 01/27/2023    3:47 PM  Fall Risk  Falls in the past year?   0 0 0  Was there an injury with Fall?   0 0 0  Fall Risk Category Calculator   0 0 0  Fall Risk Category (Retired)   Low Low   (RETIRED) Patient Fall Risk Level High fall risk High fall risk Moderate fall risk Low fall risk   Patient at Risk for Falls Due to   Impaired balance/gait No Fall Risks No Fall Risks  Fall risk Follow up   Education provided Falls evaluation completed Falls evaluation completed      Review of Systems  Constitutional:  Negative for chills, diaphoresis, fatigue and fever.  HENT:  Negative for congestion, ear pain and sore throat.   Respiratory:  Negative for cough and shortness of breath.    Cardiovascular:  Negative for chest pain and leg swelling.  Gastrointestinal:  Negative for abdominal pain, constipation, diarrhea, nausea and vomiting.  Genitourinary:  Negative for dysuria and urgency.  Musculoskeletal:  Negative for arthralgias and myalgias.  Skin:  Positive for rash (Dry rash on hands).  Neurological:  Negative for dizziness and headaches.  Psychiatric/Behavioral:  Negative for dysphoric mood.     Current Outpatient Medications on File Prior to Visit  Medication Sig Dispense Refill   acetaminophen (TYLENOL) 650 MG CR tablet Take 650 mg by mouth every 8 (eight) hours as needed for pain.     albuterol (VENTOLIN HFA) 108 (90 Base) MCG/ACT inhaler Inhale into the lungs.     amiodarone (PACERONE) 200 MG tablet Take 1 tablet by mouth 5 days per week ( Monday-Friday) 90 tablet 3   aspirin EC 81 MG tablet Take by mouth.     atorvastatin (LIPITOR) 40 MG tablet TAKE 1 TABLET(40 MG) BY MOUTH DAILY 90 tablet 2   clopidogrel (PLAVIX) 75 MG tablet TAKE 1 TABLET(75 MG) BY MOUTH DAILY 90 tablet 0   levothyroxine (SYNTHROID) 88 MCG  tablet TAKE 1 TABLET(88 MCG) BY MOUTH DAILY BEFORE BREAKFAST 30 tablet 6   loratadine (CLARITIN) 10 MG tablet Take 10 mg by mouth daily as needed for allergies.     metoprolol succinate (TOPROL-XL) 25 MG 24 hr tablet TAKE 1 TABLET(25 MG) BY MOUTH AT BEDTIME 90 tablet 0   OVER THE COUNTER MEDICATION Place 1 spray into both nostrils as needed (nasal congestion). Xytiol     ranolazine (RANEXA) 500 MG 12 hr tablet TAKE 1 TABLET BY MOUTH TWICE DAILY 180 tablet 3   spironolactone (ALDACTONE) 25 MG tablet Take 0.5 tablets (12.5 mg total) by mouth at bedtime. 90 tablet 0   nitroGLYCERIN (NITROSTAT) 0.4 MG SL tablet Place 1 tablet (0.4 mg total) under the tongue every 5 (five) minutes as needed for chest pain. 25 tablet 0   No current facility-administered medications on file prior to visit.   Past Medical History:  Diagnosis Date   CAD (coronary artery disease)     Chronic kidney disease, stage 3a (HCC)    Chronic systolic CHF (congestive heart failure) (HCC)    Dilated aortic root (HCC) 12/05/2018   Hyperlipidemia 08/15/2020   Hypothyroidism    Left bundle branch block 09/14/2019   Old myocardial infarction 06/26/2011   Prediabetes 05/05/2010   Presence of cardiac defibrillator    S/P CABG (coronary artery bypass graft)    Ventricular tachycardia, monomorphic (HCC)    Past Surgical History:  Procedure Laterality Date   ABDOMINAL HERNIA REPAIR  2020   bypass  2001   CHOLECYSTECTOMY  1996   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   LEFT HEART CATH AND CORS/GRAFTS ANGIOGRAPHY N/A 01/07/2022   Procedure: LEFT HEART CATH AND CORS/GRAFTS ANGIOGRAPHY;  Surgeon: Lyn Records, MD;  Location: [facility];  Service: Cardiovascular;  Laterality: N/A;    Family History  Problem Relation Age of Onset   Dementia Mother    Heart Problems Mother    Heart Problems Father    Social History   Socioeconomic History   Marital status: Married    Spouse name: Darl Pikes   Number of children: 3   Years of education: Not on file   Highest education level: Not on file  Occupational History   Not on file  Tobacco Use   Smoking status: Former    Types: Cigarettes    Quit date: 1970    Years since quitting: 54.1   Smokeless tobacco: Never  Vaping Use   Vaping Use: Never used  Substance and Sexual Activity   Alcohol use: Not Currently   Drug use: Never   Sexual activity: Yes    Partners: Female  Other Topics Concern   Not on file  Social History Narrative   Retired - currently Armed forces operational officer at [employer].  Lives with wife, moved back to [place] from [place] a couple if years ago.  Has one son and two daughters.   Social Determinants of Health   Financial Resource Strain: Not on file  Food Insecurity: No Food Insecurity (07/21/2022)   Hunger Vital Sign    Worried About Running Out of Food in the Last Year: Never true    Ran Out of Food in the Last  Year: Never true  Transportation Needs: No Transportation Needs (07/21/2022)   PRAPARE - Administrator, Civil Service (Medical): No    Lack of Transportation (Non-Medical): No  Physical Activity: Sufficiently Active (07/21/2022)   Exercise Vital Sign    Days of Exercise per  Week: 7 days    Minutes of Exercise per Session: 60 min  Stress: No Stress Concern Present (07/21/2022)   [employer] of Occupational Health - Occupational Stress Questionnaire    Feeling of Stress : Not at all  Social Connections: Not on file    Objective:  BP (!) 90/58 (BP Location: Left Arm, Patient Position: Sitting)   Pulse (!) 56   Temp (!) 97.3 F (36.3 C) (Temporal)   Ht 6\' 3"  (1.905 m)   Wt 269 lb (122 kg)   SpO2 97%   BMI 33.62 kg/m      01/27/2023    3:53 PM 12/07/2022    4:30 PM 11/02/2022    1:54 PM  BP/Weight  Systolic BP 90 110 120  Diastolic BP 58 64 60  Wt. (Lbs) 269 268 264  BMI 33.62 kg/m2 47.47 kg/m2 33 kg/m2    Physical Exam Vitals reviewed.  Constitutional:      Appearance: Normal appearance. He is obese.  Neck:     Vascular: No carotid bruit.  Cardiovascular:     Rate and Rhythm: Normal rate and regular rhythm.     Heart sounds: Normal heart sounds.  Pulmonary:     Effort: Pulmonary effort is normal.     Breath sounds: Normal breath sounds.  Abdominal:     General: Abdomen is flat. Bowel sounds are normal.     Palpations: Abdomen is soft.     Tenderness: There is no abdominal tenderness.  Neurological:     Mental Status: He is alert and oriented to person, place, and time.  Psychiatric:        Mood and Affect: Mood normal.        Behavior: Behavior normal.     Diabetic Foot Exam - Simple   No data filed      Lab Results  Component Value Date   WBC 11.3 (H) 09/22/2022   HGB 15.5 09/22/2022   HCT 46.5 09/22/2022   PLT 248 09/22/2022   GLUCOSE 97 09/22/2022   CHOL 217 (H) 09/22/2022   TRIG 195 (H) 09/22/2022   HDL 40 09/22/2022   LDLCALC  142 (H) 09/22/2022   ALT 21 09/22/2022   AST 21 09/22/2022   NA 140 09/22/2022   K 4.3 09/22/2022   CL 100 09/22/2022   CREATININE 1.12 09/22/2022   BUN 15 09/22/2022   CO2 25 09/22/2022   TSH 5.900 (H) 09/22/2022   HGBA1C 5.6 09/22/2022      Assessment & Plan:    Chronic kidney disease, stage 3a (HCC) Assessment & Plan: Stable   Mixed hyperlipidemia Assessment & Plan: Well controlled.  No changes to medicines. Atorvastatin 40 mg daily Continue to work on eating a healthy diet and exercise.  Labs drawn today.    Orders: -     CBC with Differential/Platelet; Future -     Comprehensive metabolic panel; Future -     Lipid panel; Future  Prediabetes Assessment & Plan: Medicines: Increase Jardiance 25 mg daily. Continue to work on eating a healthy diet and exercise.  Labs drawn today.     Orders: -     Hemoglobin A1c; Future -     Empagliflozin; Take 1 tablet (25 mg total) by mouth daily before breakfast.  Dispense: 90 tablet; Refill: 3  Hypothyroidism due to Hashimoto's thyroiditis Assessment & Plan: Previously well controlled Continue Synthroid at current dose  Recheck TSH and adjust Synthroid as indicated    Orders: -  TSH; Future  Chronic systolic heart failure (HCC) Assessment & Plan: Management per specialist. Continue spironolactone 25 mg 1/2 tablet daily, lasix 40 mg daily.  Increase jardiance to 25 mg 1/2 daily.    Class 1 obesity due to excess calories with serious comorbidity and body mass index (BMI) of 33.0 to 33.9 in adult Assessment & Plan: Recommend continue to work on eating healthy diet and exercise.    Ventricular tachycardia (HCC) Assessment & Plan: Continue amiodarone and toprol xl as prescribed.    Atherosclerosis of native coronary artery of native heart with angina pectoris [facility]) Assessment & Plan: Continue aspirin 81 mg daily, plavvix 75 mg daily, toprol xl 25 mg daily, and ranolazine 500 mg twice daily.  Management per  specialist.     Presence of cardiac defibrillator Assessment & Plan: Management by cardiology.    Atopic dermatitis of both hands Assessment & Plan: Recommend use vaseline on hands.    Other orders -     Furosemide; Take 1 tablet (40 mg total) by mouth daily.  Dispense: 90 tablet; Refill: 1     Meds ordered this encounter  Medications   furosemide (LASIX) 40 MG tablet    Sig: Take 1 tablet (40 mg total) by mouth daily.    Dispense:  90 tablet    Refill:  1   empagliflozin (JARDIANCE) 25 MG TABS tablet    Sig: Take 1 tablet (25 mg total) by mouth daily before breakfast.    Dispense:  90 tablet    Refill:  3    Orders Placed This Encounter  Procedures   TSH   CBC with Differential/Platelet   Comprehensive metabolic panel   Hemoglobin A1c   Lipid panel     Follow-up: Return in about 4 months (around 05/28/2023) for chronic follow up.  An After Visit Summary was printed and given to the patient.   I,Lauren M Auman,acting as a scribe for Blane Ohara, MD.,have documented all relevant documentation on the behalf of Blane Ohara, MD,as directed by  Blane Ohara, MD while in the presence of Blane Ohara, MD.    Blane Ohara, MD [facility] [phone]

## 2023-01-29 NOTE — Assessment & Plan Note (Signed)
Previously well controlled Continue Synthroid at current dose  Recheck TSH and adjust Synthroid as indicated   

## 2023-01-29 NOTE — Assessment & Plan Note (Signed)
Management per specialist. Continue spironolactone 25 mg 1/2 tablet daily, lasix 40 mg daily.  Increase jardiance to 25 mg 1/2 daily.

## 2023-01-29 NOTE — Assessment & Plan Note (Signed)
Recommend continue to work on eating healthy diet and exercise.  

## 2023-01-29 NOTE — Assessment & Plan Note (Signed)
Stable. 

## 2023-01-30 DIAGNOSIS — L209 Atopic dermatitis, unspecified: Secondary | ICD-10-CM | POA: Insufficient documentation

## 2023-01-30 NOTE — Assessment & Plan Note (Signed)
Continue aspirin 81 mg daily, plavvix 75 mg daily, toprol xl 25 mg daily, and ranolazine 500 mg twice daily.  Management per specialist.

## 2023-01-30 NOTE — Assessment & Plan Note (Signed)
Continue amiodarone and toprol xl as prescribed.

## 2023-01-30 NOTE — Progress Notes (Signed)
Subjective:  Patient ID: Bryan Miller, male    DOB: [birth year]  Age: 72 y.o. MRN: [id number]  Chief Complaint  Patient presents with   Hypothyroidism   Hyperlipidemia   Prediabetes   Congestive Heart Failure    HPI: Patient presents with hyperlipidemia.  Compliance with treatment has been good; patient takes medicines as directed, maintains low cholesterol diet, follows up as directed, and maintains exercise regimen.  Patient is using Atorvastatin 40 mg daily without problems.     Hypothyroidism: Taking Levothyroxine 88 mcg daily.   Prediabetes: not Taking Jardiance 10 mg daily.    CHF: He takes spironolactone 25 mg 1/2 tablet daily, Isosorbide 30 mg daily.  CORONARY ARTERY DISEASE: on isosorbide.  Ventricular tachycardia: Currently on amiodarone 200 daily Monday through Friday. Not taking on the weekend. Per patient, Dr. Graciela Husbands is trying to wean him down as his EF improved.     01/27/2023    3:54 PM 07/21/2022   10:52 AM 04/28/2022    9:48 AM 03/18/2021    3:08 PM 03/18/2021    3:03 PM  Depression screen PHQ 2/9  Decreased Interest 0 0 0 0 0  Down, Depressed, Hopeless 0 0 0 0 0  PHQ - 2 Score 0 0 0 0 0         01/09/2022    8:00 AM 01/09/2022    9:22 PM 04/28/2022    9:47 AM 07/21/2022   10:54 AM 01/27/2023    3:47 PM  Fall Risk  Falls in the past year?   0 0 0  Was there an injury with Fall?   0 0 0  Fall Risk Category Calculator   0 0 0  Fall Risk Category (Retired)   Low Low   (RETIRED) Patient Fall Risk Level High fall risk High fall risk Moderate fall risk Low fall risk   Patient at Risk for Falls Due to   Impaired balance/gait No Fall Risks No Fall Risks  Fall risk Follow up   Education provided Falls evaluation completed Falls evaluation completed      Review of Systems  Constitutional:  Negative for chills, diaphoresis, fatigue and fever.  HENT:  Negative for congestion, ear pain and sore throat.   Respiratory:  Negative for cough and shortness of breath.    Cardiovascular:  Negative for chest pain and leg swelling.  Gastrointestinal:  Negative for abdominal pain, constipation, diarrhea, nausea and vomiting.  Genitourinary:  Negative for dysuria and urgency.  Musculoskeletal:  Negative for arthralgias and myalgias.  Skin:  Positive for rash (Dry rash on hands).  Neurological:  Negative for dizziness and headaches.  Psychiatric/Behavioral:  Negative for dysphoric mood.     Current Outpatient Medications on File Prior to Visit  Medication Sig Dispense Refill   acetaminophen (TYLENOL) 650 MG CR tablet Take 650 mg by mouth every 8 (eight) hours as needed for pain.     albuterol (VENTOLIN HFA) 108 (90 Base) MCG/ACT inhaler Inhale into the lungs.     amiodarone (PACERONE) 200 MG tablet Take 1 tablet by mouth 5 days per week ( Monday-Friday) 90 tablet 3   aspirin EC 81 MG tablet Take by mouth.     atorvastatin (LIPITOR) 40 MG tablet TAKE 1 TABLET(40 MG) BY MOUTH DAILY 90 tablet 2   clopidogrel (PLAVIX) 75 MG tablet TAKE 1 TABLET(75 MG) BY MOUTH DAILY 90 tablet 0   levothyroxine (SYNTHROID) 88 MCG tablet TAKE 1 TABLET(88 MCG) BY MOUTH DAILY BEFORE BREAKFAST 30 tablet  6   loratadine (CLARITIN) 10 MG tablet Take 10 mg by mouth daily as needed for allergies.     metoprolol succinate (TOPROL-XL) 25 MG 24 hr tablet TAKE 1 TABLET(25 MG) BY MOUTH AT BEDTIME 90 tablet 0   OVER THE COUNTER MEDICATION Place 1 spray into both nostrils as needed (nasal congestion). Xytiol     ranolazine (RANEXA) 500 MG 12 hr tablet TAKE 1 TABLET BY MOUTH TWICE DAILY 180 tablet 3   spironolactone (ALDACTONE) 25 MG tablet Take 0.5 tablets (12.5 mg total) by mouth at bedtime. 90 tablet 0   nitroGLYCERIN (NITROSTAT) 0.4 MG SL tablet Place 1 tablet (0.4 mg total) under the tongue every 5 (five) minutes as needed for chest pain. 25 tablet 0   No current facility-administered medications on file prior to visit.   Past Medical History:  Diagnosis Date   CAD (coronary artery disease)     Chronic kidney disease, stage 3a (HCC)    Chronic systolic CHF (congestive heart failure) (HCC)    Dilated aortic root (HCC) 12/05/2018   Hyperlipidemia 08/15/2020   Hypothyroidism    Left bundle branch block 09/14/2019   Old myocardial infarction 06/26/2011   Prediabetes 05/05/2010   Presence of cardiac defibrillator    S/P CABG (coronary artery bypass graft)    Ventricular tachycardia, monomorphic (HCC)    Past Surgical History:  Procedure Laterality Date   ABDOMINAL HERNIA REPAIR  2020   bypass  2001   CHOLECYSTECTOMY  1996   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   LEFT HEART CATH AND CORS/GRAFTS ANGIOGRAPHY N/A 01/07/2022   Procedure: LEFT HEART CATH AND CORS/GRAFTS ANGIOGRAPHY;  Surgeon: Lyn Records, MD;  Location: [facility];  Service: Cardiovascular;  Laterality: N/A;    Family History  Problem Relation Age of Onset   Dementia Mother    Heart Problems Mother    Heart Problems Father    Social History   Socioeconomic History   Marital status: Married    Spouse name: Darl Pikes   Number of children: 3   Years of education: Not on file   Highest education level: Not on file  Occupational History   Not on file  Tobacco Use   Smoking status: Former    Types: Cigarettes    Quit date: 1970    Years since quitting: 54.1   Smokeless tobacco: Never  Vaping Use   Vaping Use: Never used  Substance and Sexual Activity   Alcohol use: Not Currently   Drug use: Never   Sexual activity: Yes    Partners: Female  Other Topics Concern   Not on file  Social History Narrative   Retired - currently Armed forces operational officer at [employer].  Lives with wife, moved back to [place] from [place] a couple if years ago.  Has one son and two daughters.   Social Determinants of Health   Financial Resource Strain: Not on file  Food Insecurity: No Food Insecurity (07/21/2022)   Hunger Vital Sign    Worried About Running Out of Food in the Last Year: Never true    Ran Out of Food in the Last  Year: Never true  Transportation Needs: No Transportation Needs (07/21/2022)   PRAPARE - Administrator, Civil Service (Medical): No    Lack of Transportation (Non-Medical): No  Physical Activity: Sufficiently Active (07/21/2022)   Exercise Vital Sign    Days of Exercise per Week: 7 days    Minutes of Exercise per Session: 60  min  Stress: No Stress Concern Present (07/21/2022)   [employer] of Occupational Health - Occupational Stress Questionnaire    Feeling of Stress : Not at all  Social Connections: Not on file    Objective:  BP (!) 90/58 (BP Location: Left Arm, Patient Position: Sitting)   Pulse (!) 56   Temp (!) 97.3 F (36.3 C) (Temporal)   Ht 6\' 3"  (1.905 m)   Wt 269 lb (122 kg)   SpO2 97%   BMI 33.62 kg/m      01/27/2023    3:53 PM 12/07/2022    4:30 PM 11/02/2022    1:54 PM  BP/Weight  Systolic BP 90 110 120  Diastolic BP 58 64 60  Wt. (Lbs) 269 268 264  BMI 33.62 kg/m2 47.47 kg/m2 33 kg/m2    Physical Exam Vitals reviewed.  Constitutional:      Appearance: Normal appearance. He is obese.  Neck:     Vascular: No carotid bruit.  Cardiovascular:     Rate and Rhythm: Normal rate and regular rhythm.     Heart sounds: Normal heart sounds.  Pulmonary:     Effort: Pulmonary effort is normal.     Breath sounds: Normal breath sounds.  Abdominal:     General: Abdomen is flat. Bowel sounds are normal.     Palpations: Abdomen is soft.     Tenderness: There is no abdominal tenderness.  Neurological:     Mental Status: He is alert and oriented to person, place, and time.  Psychiatric:        Mood and Affect: Mood normal.        Behavior: Behavior normal.     Diabetic Foot Exam - Simple   No data filed      Lab Results  Component Value Date   WBC 11.3 (H) 09/22/2022   HGB 15.5 09/22/2022   HCT 46.5 09/22/2022   PLT 248 09/22/2022   GLUCOSE 97 09/22/2022   CHOL 217 (H) 09/22/2022   TRIG 195 (H) 09/22/2022   HDL 40 09/22/2022   LDLCALC  142 (H) 09/22/2022   ALT 21 09/22/2022   AST 21 09/22/2022   NA 140 09/22/2022   K 4.3 09/22/2022   CL 100 09/22/2022   CREATININE 1.12 09/22/2022   BUN 15 09/22/2022   CO2 25 09/22/2022   TSH 5.900 (H) 09/22/2022   HGBA1C 5.6 09/22/2022      Assessment & Plan:    Chronic kidney disease, stage 3a (HCC) Assessment & Plan: Stable   Mixed hyperlipidemia Assessment & Plan: Well controlled.  No changes to medicines. Atorvastatin 40 mg daily Continue to work on eating a healthy diet and exercise.  Return for fasting labs.    Orders: -     CBC with Differential/Platelet; Future -     Comprehensive metabolic panel; Future -     Lipid panel; Future  Prediabetes Assessment & Plan: Medicines: Increase Jardiance 25 mg daily. Continue to work on eating a healthy diet and exercise.  Labs drawn today.     Orders: -     Hemoglobin A1c; Future -     Empagliflozin; Take 1 tablet (25 mg total) by mouth daily before breakfast.  Dispense: 90 tablet; Refill: 3  Hypothyroidism due to Hashimoto's thyroiditis Assessment & Plan: Previously well controlled Continue Synthroid at current dose  Recheck TSH and adjust Synthroid as indicated    Orders: -     TSH; Future  Chronic systolic heart failure (HCC) Assessment &  Plan: Management per specialist. Continue spironolactone 25 mg 1/2 tablet daily, lasix 40 mg daily.  Increase jardiance to 25 mg 1/2 daily.    Class 1 obesity due to excess calories with serious comorbidity and body mass index (BMI) of 33.0 to 33.9 in adult Assessment & Plan: Recommend continue to work on eating healthy diet and exercise.    Ventricular tachycardia (HCC) Assessment & Plan: Continue amiodarone and toprol xl as prescribed.    Atherosclerosis of native coronary artery of native heart with angina pectoris [facility]) Assessment & Plan: Continue aspirin 81 mg daily, plavvix 75 mg daily, toprol xl 25 mg daily, and ranolazine 500 mg twice daily.   Management per specialist.     Presence of cardiac defibrillator Assessment & Plan: Management by cardiology.    Atopic dermatitis of both hands Assessment & Plan: Recommend use vaseline on hands.    Other orders -     Furosemide; Take 1 tablet (40 mg total) by mouth daily.  Dispense: 90 tablet; Refill: 1     Meds ordered this encounter  Medications   furosemide (LASIX) 40 MG tablet    Sig: Take 1 tablet (40 mg total) by mouth daily.    Dispense:  90 tablet    Refill:  1   empagliflozin (JARDIANCE) 25 MG TABS tablet    Sig: Take 1 tablet (25 mg total) by mouth daily before breakfast.    Dispense:  90 tablet    Refill:  3    Orders Placed This Encounter  Procedures   TSH   CBC with Differential/Platelet   Comprehensive metabolic panel   Hemoglobin A1c   Lipid panel     Follow-up: Return in about 4 months (around 05/28/2023) for chronic follow up.  An After Visit Summary was printed and given to the patient.  Tawny Asal, CMA, acting as a scribe for Blane Ohara, MD.,have documented all relevant documentation on the behalf of Blane Ohara, MD,as directed by  Blane Ohara, MD while in the presence of Blane Ohara, MD.    Blane Ohara, MD [facility] [phone]

## 2023-01-30 NOTE — Assessment & Plan Note (Signed)
Recommend use vaseline on hands.

## 2023-01-30 NOTE — Assessment & Plan Note (Signed)
Management by cardiology. 

## 2023-02-03 ENCOUNTER — Other Ambulatory Visit: Payer: Medicare HMO

## 2023-02-03 DIAGNOSIS — E782 Mixed hyperlipidemia: Secondary | ICD-10-CM

## 2023-02-03 DIAGNOSIS — R7303 Prediabetes: Secondary | ICD-10-CM

## 2023-02-03 DIAGNOSIS — E038 Other specified hypothyroidism: Secondary | ICD-10-CM

## 2023-02-03 DIAGNOSIS — E063 Autoimmune thyroiditis: Secondary | ICD-10-CM | POA: Diagnosis not present

## 2023-02-04 LAB — FECAL OCCULT BLOOD, IMMUNOCHEMICAL: IFOBT: NEGATIVE

## 2023-02-04 LAB — CBC WITH DIFFERENTIAL/PLATELET
Basophils Absolute: 0 10*3/uL (ref 0.0–0.2)
Basos: 0 %
EOS (ABSOLUTE): 0.2 10*3/uL (ref 0.0–0.4)
Eos: 2 %
Hematocrit: 44.1 % (ref 37.5–51.0)
Hemoglobin: 14.9 g/dL (ref 13.0–17.7)
Immature Grans (Abs): 0 10*3/uL (ref 0.0–0.1)
Immature Granulocytes: 0 %
Lymphocytes Absolute: 2.9 10*3/uL (ref 0.7–3.1)
Lymphs: 32 %
MCH: 32.1 pg (ref 26.6–33.0)
MCHC: 33.8 g/dL (ref 31.5–35.7)
MCV: 95 fL (ref 79–97)
Monocytes Absolute: 0.7 10*3/uL (ref 0.1–0.9)
Monocytes: 8 %
Neutrophils Absolute: 5.2 10*3/uL (ref 1.4–7.0)
Neutrophils: 58 %
Platelets: 224 10*3/uL (ref 150–450)
RBC: 4.64 x10E6/uL (ref 4.14–5.80)
RDW: 13.1 % (ref 11.6–15.4)
WBC: 9 10*3/uL (ref 3.4–10.8)

## 2023-02-04 LAB — LIPID PANEL
Chol/HDL Ratio: 3.5 ratio (ref 0.0–5.0)
Cholesterol, Total: 156 mg/dL (ref 100–199)
HDL: 45 mg/dL (ref >39)
LDL Chol Calc (NIH): 85 mg/dL (ref 0–99)
Triglycerides: 146 mg/dL (ref 0–149)
VLDL Cholesterol Cal: 26 mg/dL (ref 5–40)

## 2023-02-04 LAB — HEMOGLOBIN A1C
Est. average glucose Bld gHb Est-mCnc: 117 mg/dL
Hgb A1c MFr Bld: 5.7 % — ABNORMAL HIGH (ref 4.8–5.6)

## 2023-02-04 LAB — TSH: TSH: 4.94 u[IU]/mL — ABNORMAL HIGH (ref 0.450–4.500)

## 2023-02-04 LAB — COMPREHENSIVE METABOLIC PANEL
ALT: 15 IU/L (ref 0–44)
AST: 17 IU/L (ref 0–40)
Albumin/Globulin Ratio: 1.6 (ref 1.2–2.2)
Albumin: 4.1 g/dL (ref 3.8–4.8)
Alkaline Phosphatase: 78 IU/L (ref 44–121)
BUN/Creatinine Ratio: 14 (ref 10–24)
BUN: 16 mg/dL (ref 8–27)
Bilirubin Total: 0.5 mg/dL (ref 0.0–1.2)
CO2: 26 mmol/L (ref 20–29)
Calcium: 9.5 mg/dL (ref 8.6–10.2)
Chloride: 101 mmol/L (ref 96–106)
Creatinine, Ser: 1.15 mg/dL (ref 0.76–1.27)
Globulin, Total: 2.5 g/dL (ref 1.5–4.5)
Glucose: 92 mg/dL (ref 70–99)
Potassium: 4.6 mmol/L (ref 3.5–5.2)
Sodium: 140 mmol/L (ref 134–144)
Total Protein: 6.6 g/dL (ref 6.0–8.5)
eGFR: 68 mL/min/{1.73_m2} (ref >59)

## 2023-02-04 LAB — CARDIOVASCULAR RISK ASSESSMENT

## 2023-02-06 NOTE — Progress Notes (Signed)
Blood count normal.  Liver function normal.  Kidney function normal.  Thyroid function abnormal. Levothyroxine 100 mcg once daily in am.  Cholesterol: GREAT! Much better! It is much better than last time. Please confirm he is taking lipitor 40 mg before bed.  HBA1C: 5.7.

## 2023-02-10 ENCOUNTER — Other Ambulatory Visit: Payer: Self-pay | Admitting: Family Medicine

## 2023-02-12 DIAGNOSIS — M79672 Pain in left foot: Secondary | ICD-10-CM | POA: Diagnosis not present

## 2023-02-12 DIAGNOSIS — S90112A Contusion of left great toe without damage to nail, initial encounter: Secondary | ICD-10-CM | POA: Diagnosis not present

## 2023-02-12 DIAGNOSIS — M79675 Pain in left toe(s): Secondary | ICD-10-CM | POA: Diagnosis not present

## 2023-02-23 ENCOUNTER — Encounter: Payer: Self-pay | Admitting: Family Medicine

## 2023-03-17 ENCOUNTER — Encounter (HOSPITAL_BASED_OUTPATIENT_CLINIC_OR_DEPARTMENT_OTHER): Payer: Self-pay | Admitting: Internal Medicine

## 2023-03-17 ENCOUNTER — Ambulatory Visit (HOSPITAL_BASED_OUTPATIENT_CLINIC_OR_DEPARTMENT_OTHER): Payer: Medicare HMO | Admitting: Internal Medicine

## 2023-03-17 VITALS — BP 95/59 | HR 67 | Ht 75.0 in | Wt 266.0 lb

## 2023-03-17 DIAGNOSIS — Z951 Presence of aortocoronary bypass graft: Secondary | ICD-10-CM | POA: Diagnosis not present

## 2023-03-17 DIAGNOSIS — E782 Mixed hyperlipidemia: Secondary | ICD-10-CM | POA: Diagnosis not present

## 2023-03-17 DIAGNOSIS — T466X5A Adverse effect of antihyperlipidemic and antiarteriosclerotic drugs, initial encounter: Secondary | ICD-10-CM

## 2023-03-17 DIAGNOSIS — M791 Myalgia, unspecified site: Secondary | ICD-10-CM | POA: Diagnosis not present

## 2023-03-17 DIAGNOSIS — E785 Hyperlipidemia, unspecified: Secondary | ICD-10-CM

## 2023-03-17 NOTE — Patient Instructions (Signed)
Medication Instructions:  NO CHANGES  *If you need a refill on your cardiac medications before your next appointment, please call your pharmacy*   Lab Work: [facility] today   FASTING lab work to check cholesterol about 1 week before next visit  If you have labs (blood work) drawn today and your tests are completely normal, you will receive your results only by: [place] (if you have MyChart) OR A paper copy in the mail If you have any lab test that is abnormal or we need to change your treatment, we will call you to review the results.   Follow-Up: At [facility], you and your health needs are our priority.  As part of our continuing mission to provide you with exceptional heart care, we have created designated Provider Care Teams.  These Care Teams include your primary Cardiologist (physician) and Advanced Practice Providers (APPs -  Physician Assistants and Nurse Practitioners) who all work together to provide you with the care you need, when you need it.  We recommend signing up for the patient portal called "MyChart".  Sign up information is provided on this After Visit Summary.  MyChart is used to connect with patients for Virtual Visits (Telemedicine).  Patients are able to view lab/test results, encounter notes, upcoming appointments, etc.  Non-urgent messages can be sent to your provider as well.   To learn more about what you can do with MyChart, go to [email or web address].    Your next appointment:   4 month(s)  Provider:   Lyman Bishop MD

## 2023-03-17 NOTE — Progress Notes (Signed)
LIPID CLINIC CONSULT NOTE  Chief Complaint:  Manage dyslipidemia  Primary Care Physician: Rochel Brome, MD  Primary Cardiologist:  None  HPI:  Bryan Miller is a 72 y.o. male who is being seen today for the evaluation of dyslipidemia at the request of Lillard Anes,*.  This is a pleasant 72 year old male who was self-referred at the request of his wife who is a patient of mine.  He has a history of coronary artery disease and prior CABG as well as and aborted sudden cardiac death with AICD.  He has chronic systolic congestive heart failure, left bundle branch block, dyslipidemia and stage IIIa chronic kidney disease.  Recently has had an increase in his cholesterol.  He had been on rosuvastatin in the past but had significant side effects with this including myalgias.  He was switched to atorvastatin and has been on 40 mg daily.  He had been off the medication for "several weeks", leading up to labs in September 2023 where the cholesterol had increased from 141-217 and the LDL went up from 66 up to 142.  Subsequently he restarted his medications and labs in February 2024 showed total cholesterol 156, triglycerides 146, HDL 45 and LDL 85.  Despite taking atorvastatin he says he does have significant myalgias and discomfort with the medication.  PMHx:  Past Medical History:  Diagnosis Date   CAD (coronary artery disease)    Chronic kidney disease, stage 3a (HCC)    Chronic systolic CHF (congestive heart failure) (HCC)    Dilated aortic root ([place]) 12/05/2018   Hyperlipidemia 08/15/2020   Hypothyroidism    Left bundle branch block 09/14/2019   Old myocardial infarction 06/26/2011   Prediabetes 05/05/2010   Presence of cardiac defibrillator    S/P CABG (coronary artery bypass graft)    Ventricular tachycardia, monomorphic ([place])     Past Surgical History:  Procedure Laterality Date   ABDOMINAL HERNIA REPAIR  2020   bypass  2001   CHOLECYSTECTOMY  1996   Implanted  cardiac rhytm patient  10/17/2017   LEFT HEART CATH AND CORS/GRAFTS ANGIOGRAPHY N/A 01/07/2022   Procedure: LEFT HEART CATH AND CORS/GRAFTS ANGIOGRAPHY;  Surgeon: Belva Crome, MD;  Location: [place] CV LAB;  Service: Cardiovascular;  Laterality: N/A;    FAMHx:  Family History  Problem Relation Age of Onset   Dementia Mother    Heart Problems Mother    Heart Problems Father     SOCHx:   reports that he quit smoking about 54 years ago. His smoking use included cigarettes. He has never used smokeless tobacco. He reports that he does not currently use alcohol. He reports that he does not use drugs.  ALLERGIES:  No Known Allergies  ROS: Pertinent items noted in HPI and remainder of comprehensive ROS otherwise negative.  HOME MEDS: Current Outpatient Medications on File Prior to Visit  Medication Sig Dispense Refill   acetaminophen (TYLENOL) 650 MG CR tablet Take 650 mg by mouth every 8 (eight) hours as needed for pain.     albuterol (VENTOLIN HFA) 108 (90 Base) MCG/ACT inhaler Inhale into the lungs.     amiodarone (PACERONE) 200 MG tablet Take 1 tablet by mouth 5 days per week ( Monday-Friday) 90 tablet 3   aspirin EC 81 MG tablet Take by mouth.     atorvastatin (LIPITOR) 40 MG tablet TAKE 1 TABLET(40 MG) BY MOUTH DAILY 90 tablet 2   clopidogrel (PLAVIX) 75 MG tablet TAKE 1 TABLET(75 MG) BY  MOUTH DAILY 90 tablet 0   empagliflozin (JARDIANCE) 25 MG TABS tablet Take 1 tablet (25 mg total) by mouth daily before breakfast. 90 tablet 3   furosemide (LASIX) 40 MG tablet Take 1 tablet (40 mg total) by mouth daily. 90 tablet 1   levothyroxine (SYNTHROID) 88 MCG tablet TAKE 1 TABLET(88 MCG) BY MOUTH DAILY BEFORE BREAKFAST 30 tablet 6   loratadine (CLARITIN) 10 MG tablet Take 10 mg by mouth daily as needed for allergies.     metoprolol succinate (TOPROL-XL) 25 MG 24 hr tablet TAKE 1 TABLET(25 MG) BY MOUTH AT BEDTIME 90 tablet 0   nitroGLYCERIN (NITROSTAT) 0.4 MG SL tablet Place 1 tablet  (0.4 mg total) under the tongue every 5 (five) minutes as needed for chest pain. 25 tablet 0   OVER THE COUNTER MEDICATION Place 1 spray into both nostrils as needed (nasal congestion). Xytiol     ranolazine (RANEXA) 500 MG 12 hr tablet TAKE 1 TABLET BY MOUTH TWICE DAILY 180 tablet 3   spironolactone (ALDACTONE) 25 MG tablet Take 0.5 tablets (12.5 mg total) by mouth at bedtime. 90 tablet 0   No current facility-administered medications on file prior to visit.    LABS/IMAGING: No results found for this or any previous visit (from the past 48 hour(s)). No results found.  LIPID PANEL:    Component Value Date/Time   CHOL 156 02/03/2023 1537   TRIG 146 02/03/2023 1537   HDL 45 02/03/2023 1537   CHOLHDL 3.5 02/03/2023 1537   CHOLHDL 3.4 01/06/2022 0330   VLDL 22 01/06/2022 0330   LDLCALC 85 02/03/2023 1537    WEIGHTS: Wt Readings from Last 3 Encounters:  03/17/23 266 lb (120.7 kg)  01/27/23 269 lb (122 kg)  12/07/22 268 lb (121.6 kg)    VITALS: BP (!) 95/59 (BP Location: Right Arm, Patient Position: Sitting, Cuff Size: Large)   Pulse 67   Ht 6\' 3"  (1.905 m)   Wt 266 lb (120.7 kg)   SpO2 96%   BMI 33.25 kg/m   EXAM: Deferred  EKG: Deferred  ASSESSMENT: Mixed dyslipidemia, goal LDL less than 70 Ischemic cardiomyopathy, LVEF 40 to 45% (10/2022) History of VT and 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   Mr. Ramp is taking atorvastatin but is having some intolerance of the medication.  Even with this he remains above target LDL less than 70.  I think he would benefit from a statin holiday to see if his muscle pain resolves.  He said while taking a break from it for several weeks he felt much better.  Since he has tried and failed to statins, he would be a good candidate for PCSK9 inhibitor in my opinion.  We could also try an option with Nexletol Nexlizet.  I would also get a LP(a) today which could help further inform our  decision making.  Plan follow-up with me in about 3 to 4 months with repeat lipids.  Pixie Casino, MD, [facility] [place] Director of the Advanced Lipid Disorders &  Cardiovascular Risk Reduction Clinic Diplomate of the American Board of Clinical Lipidology Attending Cardiologist  Direct Dial: [phone]  Fax: [phone]  Website:  [email or web address] 03/17/2023, 3:29 PM

## 2023-03-18 ENCOUNTER — Telehealth: Payer: Self-pay | Admitting: Internal Medicine

## 2023-03-18 LAB — LIPOPROTEIN A (LPA): Lipoprotein (a): 142.2 nmol/L — ABNORMAL HIGH (ref <75.0)

## 2023-03-18 NOTE — Telephone Encounter (Signed)
Based on lab results, PA for Repatha Sure[employer] was submitted. Medication approved until 12/27/23  Update sent to patient. Will wait on reply before sending in Rx

## 2023-03-21 MED ORDER — REPATHA SURECLICK 140 MG/ML ~~LOC~~ SOAJ: 1.0000 mL | SUBCUTANEOUS | 3 refills | Status: DC

## 2023-03-21 NOTE — Addendum Note (Signed)
Addended by: Fidel Levy on: 03/21/2023 08:11 AM   Modules accepted: Orders

## 2023-03-25 ENCOUNTER — Other Ambulatory Visit: Payer: Self-pay | Admitting: Family Medicine

## 2023-03-30 DIAGNOSIS — H5203 Hypermetropia, bilateral: Secondary | ICD-10-CM | POA: Diagnosis not present

## 2023-03-30 DIAGNOSIS — H524 Presbyopia: Secondary | ICD-10-CM | POA: Diagnosis not present

## 2023-04-25 ENCOUNTER — Other Ambulatory Visit: Payer: Self-pay | Admitting: Family Medicine

## 2023-04-25 MED ORDER — FLUTICASONE PROPIONATE 50 MCG/ACT NA SUSP: 2.0000 | Freq: Every day | NASAL | 6 refills | Status: AC

## 2023-04-26 ENCOUNTER — Ambulatory Visit (INDEPENDENT_AMBULATORY_CARE_PROVIDER_SITE_OTHER): Payer: Medicare HMO

## 2023-04-26 DIAGNOSIS — I4729 Other ventricular tachycardia: Secondary | ICD-10-CM

## 2023-04-26 DIAGNOSIS — R051 Acute cough: Secondary | ICD-10-CM | POA: Diagnosis not present

## 2023-04-26 DIAGNOSIS — J019 Acute sinusitis, unspecified: Secondary | ICD-10-CM | POA: Diagnosis not present

## 2023-05-03 LAB — CUP PACEART REMOTE DEVICE CHECK
Battery Remaining Longevity: 72 mo
Battery Remaining Percentage: 100 %
Brady Statistic RA Percent Paced: 11 %
Brady Statistic RV Percent Paced: 97 %
Date Time Interrogation Session: 20240503224900
HighPow Impedance: 68 Ohm
Implantable Lead Connection Status: 753985
Implantable Lead Connection Status: 753985
Implantable Lead Connection Status: 753985
Implantable Lead Implant Date: 20181022
Implantable Lead Implant Date: 20181022
Implantable Lead Implant Date: 20181022
Implantable Lead Location: 753858
Implantable Lead Location: 753859
Implantable Lead Location: 753860
Implantable Lead Model: 293
Implantable Lead Model: 4671
Implantable Lead Model: 7741
Implantable Lead Serial Number: 423377
Implantable Lead Serial Number: 809750
Implantable Lead Serial Number: 954460
Implantable Pulse Generator Implant Date: 20181022
Lead Channel Impedance Value: 424 Ohm
Lead Channel Impedance Value: 621 Ohm
Lead Channel Impedance Value: 850 Ohm
Lead Channel Pacing Threshold Amplitude: 0.1 V
Lead Channel Pacing Threshold Amplitude: 0.7 V
Lead Channel Pacing Threshold Amplitude: 1.3 V
Lead Channel Pacing Threshold Pulse Width: 0.4 ms
Lead Channel Pacing Threshold Pulse Width: 0.4 ms
Lead Channel Pacing Threshold Pulse Width: 0.4 ms
Lead Channel Setting Pacing Amplitude: 2 V
Lead Channel Setting Pacing Amplitude: 2 V
Lead Channel Setting Pacing Amplitude: 2.6 V
Lead Channel Setting Pacing Pulse Width: 0.4 ms
Lead Channel Setting Pacing Pulse Width: 0.4 ms
Lead Channel Setting Sensing Sensitivity: 0.6 mV
Lead Channel Setting Sensing Sensitivity: 1 mV
Pulse Gen Serial Number: 181086

## 2023-05-07 DIAGNOSIS — I9589 Other hypotension: Secondary | ICD-10-CM | POA: Diagnosis not present

## 2023-05-07 DIAGNOSIS — S3993XA Unspecified injury of pelvis, initial encounter: Secondary | ICD-10-CM | POA: Diagnosis not present

## 2023-05-07 DIAGNOSIS — S52109A Unspecified fracture of upper end of unspecified radius, initial encounter for closed fracture: Secondary | ICD-10-CM | POA: Diagnosis not present

## 2023-05-07 DIAGNOSIS — Z043 Encounter for examination and observation following other accident: Secondary | ICD-10-CM | POA: Diagnosis not present

## 2023-05-07 DIAGNOSIS — S52134A Nondisplaced fracture of neck of right radius, initial encounter for closed fracture: Secondary | ICD-10-CM | POA: Diagnosis not present

## 2023-05-07 DIAGNOSIS — S3991XA Unspecified injury of abdomen, initial encounter: Secondary | ICD-10-CM | POA: Diagnosis not present

## 2023-05-07 DIAGNOSIS — S3210XA Unspecified fracture of sacrum, initial encounter for closed fracture: Secondary | ICD-10-CM | POA: Diagnosis not present

## 2023-05-07 DIAGNOSIS — S300XXA Contusion of lower back and pelvis, initial encounter: Secondary | ICD-10-CM | POA: Diagnosis not present

## 2023-05-07 DIAGNOSIS — S0990XA Unspecified injury of head, initial encounter: Secondary | ICD-10-CM | POA: Diagnosis not present

## 2023-05-08 DIAGNOSIS — M79632 Pain in left forearm: Secondary | ICD-10-CM | POA: Diagnosis not present

## 2023-05-08 DIAGNOSIS — S299XXA Unspecified injury of thorax, initial encounter: Secondary | ICD-10-CM | POA: Diagnosis not present

## 2023-05-08 DIAGNOSIS — S52134A Nondisplaced fracture of neck of right radius, initial encounter for closed fracture: Secondary | ICD-10-CM | POA: Diagnosis not present

## 2023-05-08 DIAGNOSIS — M25521 Pain in right elbow: Secondary | ICD-10-CM | POA: Diagnosis not present

## 2023-05-08 DIAGNOSIS — S37892A Contusion of other urinary and pelvic organ, initial encounter: Secondary | ICD-10-CM | POA: Diagnosis not present

## 2023-05-08 DIAGNOSIS — W19XXXA Unspecified fall, initial encounter: Secondary | ICD-10-CM | POA: Diagnosis not present

## 2023-05-08 DIAGNOSIS — M25531 Pain in right wrist: Secondary | ICD-10-CM | POA: Diagnosis not present

## 2023-05-08 DIAGNOSIS — I9589 Other hypotension: Secondary | ICD-10-CM | POA: Diagnosis not present

## 2023-05-08 DIAGNOSIS — M79631 Pain in right forearm: Secondary | ICD-10-CM | POA: Diagnosis not present

## 2023-05-08 DIAGNOSIS — I959 Hypotension, unspecified: Secondary | ICD-10-CM | POA: Diagnosis not present

## 2023-05-08 DIAGNOSIS — S72001A Fracture of unspecified part of neck of right femur, initial encounter for closed fracture: Secondary | ICD-10-CM | POA: Diagnosis not present

## 2023-05-08 DIAGNOSIS — Z951 Presence of aortocoronary bypass graft: Secondary | ICD-10-CM | POA: Diagnosis not present

## 2023-05-08 DIAGNOSIS — M79622 Pain in left upper arm: Secondary | ICD-10-CM | POA: Diagnosis not present

## 2023-05-08 DIAGNOSIS — M79641 Pain in right hand: Secondary | ICD-10-CM | POA: Diagnosis not present

## 2023-05-08 DIAGNOSIS — M25532 Pain in left wrist: Secondary | ICD-10-CM | POA: Diagnosis not present

## 2023-05-08 DIAGNOSIS — W109XXA Fall (on) (from) unspecified stairs and steps, initial encounter: Secondary | ICD-10-CM | POA: Diagnosis not present

## 2023-05-08 DIAGNOSIS — R102 Pelvic and perineal pain: Secondary | ICD-10-CM | POA: Diagnosis not present

## 2023-05-08 DIAGNOSIS — S3210XA Unspecified fracture of sacrum, initial encounter for closed fracture: Secondary | ICD-10-CM | POA: Diagnosis not present

## 2023-05-08 DIAGNOSIS — S52031A Displaced fracture of olecranon process with intraarticular extension of right ulna, initial encounter for closed fracture: Secondary | ICD-10-CM | POA: Diagnosis not present

## 2023-05-08 DIAGNOSIS — Z7902 Long term (current) use of antithrombotics/antiplatelets: Secondary | ICD-10-CM | POA: Diagnosis not present

## 2023-05-08 DIAGNOSIS — S52109A Unspecified fracture of upper end of unspecified radius, initial encounter for closed fracture: Secondary | ICD-10-CM | POA: Diagnosis not present

## 2023-05-08 DIAGNOSIS — R918 Other nonspecific abnormal finding of lung field: Secondary | ICD-10-CM | POA: Diagnosis not present

## 2023-05-08 DIAGNOSIS — M79621 Pain in right upper arm: Secondary | ICD-10-CM | POA: Diagnosis not present

## 2023-05-08 DIAGNOSIS — M79642 Pain in left hand: Secondary | ICD-10-CM | POA: Diagnosis not present

## 2023-05-08 DIAGNOSIS — J471 Bronchiectasis with (acute) exacerbation: Secondary | ICD-10-CM | POA: Diagnosis not present

## 2023-05-08 DIAGNOSIS — M545 Low back pain, unspecified: Secondary | ICD-10-CM | POA: Diagnosis not present

## 2023-05-08 DIAGNOSIS — M546 Pain in thoracic spine: Secondary | ICD-10-CM | POA: Diagnosis not present

## 2023-05-10 DIAGNOSIS — J988 Other specified respiratory disorders: Secondary | ICD-10-CM | POA: Diagnosis not present

## 2023-05-10 DIAGNOSIS — R062 Wheezing: Secondary | ICD-10-CM | POA: Diagnosis not present

## 2023-05-12 ENCOUNTER — Other Ambulatory Visit: Payer: Self-pay | Admitting: Internal Medicine

## 2023-05-12 NOTE — Telephone Encounter (Signed)
Left message on machine for pt to contact the office. To clarify if pt is taking losartan.

## 2023-05-13 ENCOUNTER — Encounter: Payer: Self-pay | Admitting: Family Medicine

## 2023-05-13 ENCOUNTER — Encounter: Payer: Self-pay | Admitting: Internal Medicine

## 2023-05-13 ENCOUNTER — Encounter (HOSPITAL_BASED_OUTPATIENT_CLINIC_OR_DEPARTMENT_OTHER): Payer: Self-pay | Admitting: Internal Medicine

## 2023-05-16 ENCOUNTER — Telehealth: Payer: Self-pay

## 2023-05-16 NOTE — Telephone Encounter (Signed)
Called patient regarding the message below. Offer him a follow up appointment for Thursday 06/19/23 at 7:30 AM or 2 PM. Patient was not able to make appointment due to he stated he was back at work and could not get here until 3:45 PM.   Per Dr. Sedalia Muta: AS long as the patient is feeling better and any bruises that he has look like they are getting better he does not have to follow up. She recommends if he starts to feel worst or bruises don't look like they are healing or if he has any concern to call office and make an appointment, and also he talk to  his cardiologist about if his blood vessels are improving as far as his heart at his next follow up with them being that, this is not something she would be able to see from the scans.  Patient made aware, verbalized understanding. Stated that he figure out about the losartan thing, he called the cardiologist and they told him to stop medication.    Bryan Miller  to P [facility] (supporting Blane Ohara, MD)  [facility]    05/13/23 11:31 AM Hello,  I Had a a fall on 05-07-23  at 8:45 [place]  [place] stepping on the garage floor. It was a hard fall and I had swollen hematoma about the size of a large cantaloupe on my  butt bone. After going to [facility] emergency room because my blood pressure was dropping Along with other things, The doctor sent me redeye to [facility] trauma center, Were they said I had broken bones, however, thank the Lord I had none. At [facility] they took two full scans of my body To verify results. The request I have is would you please look at the scans and see if my blood vessels are continuing to improve as far as my heart condition.   Secondly, I have a medication (Losartan) that I have been taking 1/2 tablet every day at bedtime. It required doctors authorization for filling again, in which the doctor declined for refills. My question is am I supposed to continue taking this medicine anymore?  Is it no longer necessary?   Thank you for your help in this matter and I will be looking forward for your response. Have a wonderful day.   Bryan Miller

## 2023-05-18 NOTE — Progress Notes (Signed)
Remote ICD transmission.   

## 2023-05-29 DIAGNOSIS — Z1211 Encounter for screening for malignant neoplasm of colon: Secondary | ICD-10-CM | POA: Diagnosis not present

## 2023-06-03 LAB — COLOGUARD: COLOGUARD: NEGATIVE

## 2023-06-08 ENCOUNTER — Encounter (HOSPITAL_BASED_OUTPATIENT_CLINIC_OR_DEPARTMENT_OTHER): Payer: Self-pay | Admitting: Internal Medicine

## 2023-06-13 ENCOUNTER — Ambulatory Visit: Payer: Medicare HMO | Admitting: Family Medicine

## 2023-06-14 ENCOUNTER — Telehealth: Payer: Self-pay

## 2023-06-14 NOTE — Telephone Encounter (Signed)
CT scan of chest showed bronchiectasis, and bronchial plugs.  May need an antibiotic.  Please assess for symptoms of bronchitis or pneumonia.  If no symptoms, I would recommend referral (urgent - with in 1-2 weeks) to [facility] pulmonology. I left message for [place] from [facility] to return call. Dr. Sedalia Muta

## 2023-06-14 NOTE — Telephone Encounter (Signed)
Monica from [facility] called stating that she is wanting to know if Dr. Sedalia Muta received and looked at CT scans that were sent to her on this patient about abnormal findings of nodules? She is calling requesting if there were any recommendations made for this patient per the scan or if we have addressed or made suggestions with this patient? I could only see where the patient was scheduled for to come in for appointment which I'm assuming it would be discussed with patient at that time but then the patient called back and cancelled the appointment. Maxine Glenn is requesting a return call back about recommended next steps with patient or about the scans being addressed.  Phone number: [phone]

## 2023-06-23 DIAGNOSIS — M5137 Other intervertebral disc degeneration, lumbosacral region: Secondary | ICD-10-CM | POA: Diagnosis not present

## 2023-06-23 DIAGNOSIS — M5441 Lumbago with sciatica, right side: Secondary | ICD-10-CM | POA: Diagnosis not present

## 2023-06-23 DIAGNOSIS — M9904 Segmental and somatic dysfunction of sacral region: Secondary | ICD-10-CM | POA: Diagnosis not present

## 2023-06-23 DIAGNOSIS — M9903 Segmental and somatic dysfunction of lumbar region: Secondary | ICD-10-CM | POA: Diagnosis not present

## 2023-06-23 DIAGNOSIS — M5136 Other intervertebral disc degeneration, lumbar region: Secondary | ICD-10-CM | POA: Diagnosis not present

## 2023-06-27 ENCOUNTER — Other Ambulatory Visit: Payer: Self-pay

## 2023-06-27 MED ORDER — RANOLAZINE ER 500 MG PO TB12: 500.0000 mg | ORAL_TABLET | Freq: Two times a day (BID) | ORAL | 2 refills | Status: DC

## 2023-06-28 ENCOUNTER — Other Ambulatory Visit: Payer: Self-pay | Admitting: Family Medicine

## 2023-06-29 DIAGNOSIS — M5136 Other intervertebral disc degeneration, lumbar region: Secondary | ICD-10-CM | POA: Diagnosis not present

## 2023-06-29 DIAGNOSIS — M5441 Lumbago with sciatica, right side: Secondary | ICD-10-CM | POA: Diagnosis not present

## 2023-06-29 DIAGNOSIS — M9903 Segmental and somatic dysfunction of lumbar region: Secondary | ICD-10-CM | POA: Diagnosis not present

## 2023-06-29 DIAGNOSIS — M9904 Segmental and somatic dysfunction of sacral region: Secondary | ICD-10-CM | POA: Diagnosis not present

## 2023-06-29 DIAGNOSIS — M5137 Other intervertebral disc degeneration, lumbosacral region: Secondary | ICD-10-CM | POA: Diagnosis not present

## 2023-07-12 ENCOUNTER — Other Ambulatory Visit: Payer: Self-pay

## 2023-07-12 ENCOUNTER — Telehealth: Payer: Self-pay

## 2023-07-12 DIAGNOSIS — R918 Other nonspecific abnormal finding of lung field: Secondary | ICD-10-CM

## 2023-07-12 NOTE — Telephone Encounter (Signed)
Bryan Miller was called in follow-up of his CT chest that was on May 08, 2023.   He missed his June appointment with [place] but reports that his breathing has been good.  I explained that Dr. Sedalia Muta wants him to follow-up with pulmonary and he agreed that this was ok.  Referral sent to [facility].  He also wants to follow-up in the office for fasting labs and urine.  Appointment was scheduled.

## 2023-07-13 ENCOUNTER — Other Ambulatory Visit: Payer: Self-pay | Admitting: Family Medicine

## 2023-07-13 DIAGNOSIS — R81 Glycosuria: Secondary | ICD-10-CM | POA: Diagnosis not present

## 2023-07-13 DIAGNOSIS — R300 Dysuria: Secondary | ICD-10-CM | POA: Diagnosis not present

## 2023-07-13 DIAGNOSIS — E119 Type 2 diabetes mellitus without complications: Secondary | ICD-10-CM | POA: Diagnosis not present

## 2023-07-14 DIAGNOSIS — M9903 Segmental and somatic dysfunction of lumbar region: Secondary | ICD-10-CM | POA: Diagnosis not present

## 2023-07-14 DIAGNOSIS — M9904 Segmental and somatic dysfunction of sacral region: Secondary | ICD-10-CM | POA: Diagnosis not present

## 2023-07-14 DIAGNOSIS — M5136 Other intervertebral disc degeneration, lumbar region: Secondary | ICD-10-CM | POA: Diagnosis not present

## 2023-07-14 DIAGNOSIS — M5441 Lumbago with sciatica, right side: Secondary | ICD-10-CM | POA: Diagnosis not present

## 2023-07-14 DIAGNOSIS — M5137 Other intervertebral disc degeneration, lumbosacral region: Secondary | ICD-10-CM | POA: Diagnosis not present

## 2023-07-26 ENCOUNTER — Telehealth (INDEPENDENT_AMBULATORY_CARE_PROVIDER_SITE_OTHER): Payer: Medicare HMO | Admitting: Internal Medicine

## 2023-07-26 ENCOUNTER — Other Ambulatory Visit: Payer: Self-pay | Admitting: Internal Medicine

## 2023-07-26 ENCOUNTER — Encounter (HOSPITAL_BASED_OUTPATIENT_CLINIC_OR_DEPARTMENT_OTHER): Payer: Self-pay | Admitting: Internal Medicine

## 2023-07-26 VITALS — BP 114/73 | HR 66 | Ht 75.0 in | Wt 243.0 lb

## 2023-07-26 DIAGNOSIS — E7841 Elevated Lipoprotein(a): Secondary | ICD-10-CM | POA: Diagnosis not present

## 2023-07-26 DIAGNOSIS — M791 Myalgia, unspecified site: Secondary | ICD-10-CM | POA: Diagnosis not present

## 2023-07-26 DIAGNOSIS — E782 Mixed hyperlipidemia: Secondary | ICD-10-CM

## 2023-07-26 DIAGNOSIS — T466X5A Adverse effect of antihyperlipidemic and antiarteriosclerotic drugs, initial encounter: Secondary | ICD-10-CM

## 2023-07-26 DIAGNOSIS — Z951 Presence of aortocoronary bypass graft: Secondary | ICD-10-CM

## 2023-07-26 DIAGNOSIS — I255 Ischemic cardiomyopathy: Secondary | ICD-10-CM | POA: Diagnosis not present

## 2023-07-26 DIAGNOSIS — E785 Hyperlipidemia, unspecified: Secondary | ICD-10-CM

## 2023-07-26 NOTE — Progress Notes (Signed)
Virtual Visit via Video Note   Because of Bryan Miller's co-morbid illnesses, he is at least at moderate risk for complications without adequate follow up.  This format is felt to be most appropriate for this patient at this time.  All issues noted in this document were discussed and addressed.  A limited physical exam was performed with this format.  Please refer to the patient's chart for his consent to telehealth for [facility].      Date:  07/26/2023   ID:  ENGLISH COIT, DOB 01-Nov-1951, MRN [id number] The patient was identified using 2 identifiers.  Evaluation Performed:  Follow-Up Visit  Patient Location:  [address] [place] [address]  Provider location:   [address], [address] [place], [place] [address]  PCP:  Blane Ohara, MD  Cardiologist:  None Electrophysiologist:  None   Chief Complaint:  Follow-up  History of Present Illness:    Bryan Miller is a 72 y.o. male who presents via audio/video conferencing for a telehealth visit today.  Mr. Starck returns today for video follow-up.  He is subsequently started on Repatha therapy.  He says he has not had an injection however in 3 weeks.  He ran out of the first 60-month supply but he did not get the pens refilled.  He does have a 1 year supply with refills.  I encouraged him to pick those up and restart it.  He will need repeat lipid testing including an LP(a) in about 1 month.  He did have a fall with some trauma and [facility]  This included a chest CT which showed surprisingly multivessel coronary calcification and changes consistent with prior CABG.  He was also noted to have aortic atherosclerosis and narrowing of the celiac artery.  He denies any abdominal pain.  Prior CV studies:   The following studies were reviewed today:  Chart reviewed  PMHx:  Past Medical History:  Diagnosis Date   CAD (coronary artery disease)    Chronic kidney  disease, stage 3a (HCC)    Chronic systolic CHF (congestive heart failure) (HCC)    Dilated aortic root (HCC) 12/05/2018   Hyperlipidemia 08/15/2020   Hypothyroidism    Left bundle branch block 09/14/2019   Old myocardial infarction 06/26/2011   Prediabetes 05/05/2010   Presence of cardiac defibrillator    S/P CABG (coronary artery bypass graft)    Ventricular tachycardia, monomorphic (HCC)     Past Surgical History:  Procedure Laterality Date   ABDOMINAL HERNIA REPAIR  2020   bypass  2001   CHOLECYSTECTOMY  1996   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   LEFT HEART CATH AND CORS/GRAFTS ANGIOGRAPHY N/A 01/07/2022   Procedure: LEFT HEART CATH AND CORS/GRAFTS ANGIOGRAPHY;  Surgeon: Lyn Records, MD;  [facility];  Service: Cardiovascular;  Laterality: N/A;    FAMHx:  Family History  Problem Relation Age of Onset   Dementia Mother    Heart Problems Mother    Heart Problems Father     SOCHx:   reports that he quit smoking about 54 years ago. His smoking use included cigarettes. He has never used smokeless tobacco. He reports that he does not currently use alcohol. He reports that he does not use drugs.  ALLERGIES:  No Known Allergies  MEDS:  Current Meds  Medication Sig   acetaminophen (TYLENOL) 650 MG CR tablet Take 650 mg by mouth every 8 (eight) hours  as needed for pain.   albuterol (VENTOLIN HFA) 108 (90 Base) MCG/ACT inhaler Inhale into the lungs.   amiodarone (PACERONE) 200 MG tablet TAKE 1 TABLET(200 MG) BY MOUTH DAILY   aspirin EC 81 MG tablet Take by mouth.   atorvastatin (LIPITOR) 40 MG tablet TAKE 1 TABLET(40 MG) BY MOUTH DAILY   clopidogrel (PLAVIX) 75 MG tablet TAKE 1 TABLET(75 MG) BY MOUTH DAILY   empagliflozin (JARDIANCE) 25 MG TABS tablet Take 1 tablet (25 mg total) by mouth daily before breakfast.   Evolocumab (REPATHA SURECLICK) 140 MG/ML SOAJ Inject 140 mg into the skin every 14 (fourteen) days.   fluticasone (FLONASE) 50 MCG/ACT  nasal spray Place 2 sprays into both nostrils daily.   furosemide (LASIX) 40 MG tablet Take 1 tablet (40 mg total) by mouth daily.   levothyroxine (SYNTHROID) 88 MCG tablet TAKE 1 TABLET(88 MCG) BY MOUTH DAILY BEFORE BREAKFAST   loratadine (CLARITIN) 10 MG tablet Take 10 mg by mouth daily as needed for allergies.   metoprolol succinate (TOPROL-XL) 25 MG 24 hr tablet TAKE 1 TABLET(25 MG) BY MOUTH AT BEDTIME   nitroGLYCERIN (NITROSTAT) 0.4 MG SL tablet Place 1 tablet (0.4 mg total) under the tongue every 5 (five) minutes as needed for chest pain.   OVER THE COUNTER MEDICATION Place 1 spray into both nostrils as needed (nasal congestion). Xytiol   ranolazine (RANEXA) 500 MG 12 hr tablet Take 1 tablet (500 mg total) by mouth 2 (two) times daily.   spironolactone (ALDACTONE) 25 MG tablet TAKE 1/2 TABLET BY MOUTH EVERY NIGHT AT BEDTIME   sulfamethoxazole-trimethoprim (BACTRIM DS) 800-160 MG tablet Take 1 tablet by mouth 2 (two) times daily.     ROS: Pertinent items noted in HPI and remainder of comprehensive ROS otherwise negative.  Labs/Other Tests and Data Reviewed:    Recent Labs: 02/03/2023: ALT 15; BUN 16; Creatinine, Ser 1.15; Hemoglobin 14.9; Platelets 224; Potassium 4.6; Sodium 140; TSH 4.940   Recent Lipid Panel Lab Results  Component Value Date/Time   CHOL 156 02/03/2023 03:37 PM   TRIG 146 02/03/2023 03:37 PM   HDL 45 02/03/2023 03:37 PM   CHOLHDL 3.5 02/03/2023 03:37 PM   CHOLHDL 3.4 01/06/2022 03:30 AM   LDLCALC 85 02/03/2023 03:37 PM    Wt Readings from Last 3 Encounters:  07/26/23 243 lb (110.2 kg)  03/17/23 266 lb (120.7 kg)  01/27/23 269 lb (122 kg)     Exam:    Vital Signs:  BP 114/73   Pulse 66   Ht 6\' 3"  (1.905 m)   Wt 243 lb (110.2 kg)   BMI 30.37 kg/m    General appearance: alert and no distress Lungs: No audible wheezing Abdomen: Mildly obese Extremities: extremities normal, atraumatic, no cyanosis or edema Neurologic: Grossly normal  ASSESSMENT &  PLAN:    Mixed dyslipidemia, goal LDL less than 70 Ischemic cardiomyopathy, LVEF 40 to 45% (10/2022) CAD status post CABG History of VT and aborted sudden cardiac death Status post AICD Statin intolerance-myalgias Elevated LP(a) at 142.2 nmol/L  Mr. Schrick is seen today in follow-up via video visit.  He says he took his last Repatha shot about 3 weeks ago.  He has a years worth of refills but he finished the first 3 months supply and has not refilled it.  I advised him to pick that up and start retaking his medication.  He should be on it for 1 month before getting repeat labs and I will order an NMR and LP(a) today.  Will plan follow-up otherwise in 6 months or sooner as necessary.  Patient Risk:   After full review of this patients clinical status, I feel that they are at least moderate risk at this time.  Time:   Today, I have spent 15 minutes with the patient with telehealth technology discussing dyslipidemia.     Medication Adjustments/Labs and Tests Ordered: Current medicines are reviewed at length with the patient today.  Concerns regarding medicines are outlined above.   Tests Ordered: Orders Placed This Encounter  Procedures   NMR, lipoprofile    Medication Changes: No orders of the defined types were placed in this encounter.   Disposition:  in 6 month(s)  Chrystie Nose, MD, [facility], FACP  [place]  [facility]  Medical Director of the Advanced Lipid Disorders &  Cardiovascular Risk Reduction Clinic Diplomate of the American Board of Clinical Lipidology Attending Cardiologist  Direct Dial: [phone]  Fax: [phone]  Website:  [email or web address]  Chrystie Nose, MD  07/26/2023 4:29 PM

## 2023-07-26 NOTE — Patient Instructions (Signed)
Medication Instructions:  RESUME Repatha once every 14 days  *If you need a refill on your cardiac medications before your next appointment, please call your pharmacy*   Lab Work: FASTING NMR lipoprofile in early September   If you have labs (blood work) drawn today and your tests are completely normal, you will receive your results only by: MyChart Message (if you have MyChart) OR A paper copy in the mail If you have any lab test that is abnormal or we need to change your treatment, we will call you to review the results.   Follow-Up: At [facility], you and your health needs are our priority.  As part of our continuing mission to provide you with exceptional heart care, we have created designated Provider Care Teams.  These Care Teams include your primary Cardiologist (physician) and Advanced Practice Providers (APPs -  Physician Assistants and Nurse Practitioners) who all work together to provide you with the care you need, when you need it.  We recommend signing up for the patient portal called "MyChart".  Sign up information is provided on this After Visit Summary.  MyChart is used to connect with patients for Virtual Visits (Telemedicine).  Patients are able to view lab/test results, encounter notes, upcoming appointments, etc.  Non-urgent messages can be sent to your provider as well.   To learn more about what you can do with MyChart, go to [email or web address].    Your next appointment:    6 months with Dr. Rennis Golden

## 2023-08-01 ENCOUNTER — Ambulatory Visit (INDEPENDENT_AMBULATORY_CARE_PROVIDER_SITE_OTHER): Payer: Medicare HMO

## 2023-08-01 DIAGNOSIS — I4729 Other ventricular tachycardia: Secondary | ICD-10-CM

## 2023-08-04 ENCOUNTER — Ambulatory Visit: Payer: Medicare HMO

## 2023-08-04 VITALS — BP 112/68 | HR 59 | Temp 97.2°F | Ht 75.0 in | Wt 244.0 lb

## 2023-08-04 DIAGNOSIS — I5022 Chronic systolic (congestive) heart failure: Secondary | ICD-10-CM

## 2023-08-04 DIAGNOSIS — I25119 Atherosclerotic heart disease of native coronary artery with unspecified angina pectoris: Secondary | ICD-10-CM

## 2023-08-04 DIAGNOSIS — E063 Autoimmune thyroiditis: Secondary | ICD-10-CM | POA: Diagnosis not present

## 2023-08-04 DIAGNOSIS — E038 Other specified hypothyroidism: Secondary | ICD-10-CM

## 2023-08-04 DIAGNOSIS — R7303 Prediabetes: Secondary | ICD-10-CM | POA: Diagnosis not present

## 2023-08-04 NOTE — Assessment & Plan Note (Addendum)
Doing well at this time Denies any chest pain or shortness of breath with exertion Has been rationing his Jardiance. Okay to continue other guideline directed medical therapy with aspirin 81 mg daily, atorvastatin 40 mg daily, Lasix 40 mg daily and metoprolol succinate 25 mg daily.  Looks like he has been on Plavix chronically.  Will defer to cardiology if he should continue it indefinitely or not.  His last cardiac catheterization was in January 2023.

## 2023-08-04 NOTE — Assessment & Plan Note (Addendum)
Last A1C was 5.7 in February 2024.  Will repeat A1C today Recommend to continue healthy low calorie diet and physical activity as tolerated On Jardiance for heart failure and not for prediabetes but he has not been able to afford it, has only 10 pills left. Since he is very motivated to continue his healthy lifestyle changes, it should be okay if he cannot go back on Jardiance.

## 2023-08-04 NOTE — Assessment & Plan Note (Signed)
On 88 mcg of levothyroxine.  Will repeat TSH today

## 2023-08-04 NOTE — Patient Instructions (Signed)
1.  Ordered blood work today 2.  No change to medications at this time 3.  Appreciate your efforts to stay healthy. 4.  Please follow-up with your cardiologist and discuss any medication changes and see the lung specialist as scheduled 5.  Will see you back here in office in 6 months for follow-up.

## 2023-08-04 NOTE — Progress Notes (Addendum)
Subjective:  Patient ID: Bryan Miller, male    DOB: 26-Nov-1951  Age: 72 y.o. MRN: [id number]  Chief Complaint  Patient presents with   Medical Management of Chronic Issues   Congestive Heart Failure    HPI   Patient presents with hyperlipidemia.  Compliance with treatment has been good; patient takes medicines as directed, maintains low cholesterol diet, follows up as directed, and maintains exercise regimen.  Patient is using Atorvastatin 40 mg daily without problems.     Hypothyroidism: Taking Levothyroxine 88 mcg daily.   Prediabetes: not Taking Jardiance 10 mg daily. (Taking half tablet as he is unable to afford it. )   CHF: He takes spironolactone 25 mg 1/2 tablet daily, Isosorbide 30 mg daily.   CORONARY ARTERY DISEASE: on isosorbide.   Ventricular tachycardia: Currently on amiodarone 200 daily Monday through Friday.  Does swim and water aerobics at the community pool on a regular basis. Has lost about 48 pounds in the past year.   Last MR reported in 2017 when he was living in [place].       08/04/2023    8:43 AM 01/27/2023    3:54 PM 07/21/2022   10:52 AM 04/28/2022    9:48 AM 03/18/2021    3:08 PM  Depression screen PHQ 2/9  Decreased Interest 0 0 0 0 0  Down, Depressed, Hopeless 0 0 0 0 0  PHQ - 2 Score 0 0 0 0 0  Altered sleeping 0      Tired, decreased energy 1      Change in appetite 0      Feeling bad or failure about yourself  0      Trouble concentrating 0      Moving slowly or fidgety/restless 0      Suicidal thoughts 0      PHQ-9 Score 1      Difficult doing work/chores Not difficult at all            08/04/2023    8:43 AM  Fall Risk   Falls in the past year? 1  Number falls in past yr: 0  Injury with Fall? 1  Risk for fall due to : No Fall Risks  Follow up Falls evaluation completed    Patient Care Team: Blane Ohara, MD as PCP - General (Family Medicine) Duke Salvia, MD as Consulting Physician (Cardiology)   Review of Systems   Constitutional:  Negative for chills, diaphoresis, fatigue and fever.  HENT:  Negative for congestion, ear pain and sore throat.   Respiratory:  Negative for cough and shortness of breath.   Cardiovascular:  Negative for chest pain and leg swelling.  Gastrointestinal:  Negative for abdominal pain, constipation, diarrhea, nausea and vomiting.  Genitourinary:  Negative for dysuria and urgency.  Musculoskeletal:  Negative for arthralgias and myalgias.  Neurological:  Negative for dizziness and headaches.  Psychiatric/Behavioral:  Negative for dysphoric mood.     Current Outpatient Medications on File Prior to Visit  Medication Sig Dispense Refill   acetaminophen (TYLENOL) 650 MG CR tablet Take 650 mg by mouth every 8 (eight) hours as needed for pain.     albuterol (VENTOLIN HFA) 108 (90 Base) MCG/ACT inhaler Inhale into the lungs.     amiodarone (PACERONE) 200 MG tablet TAKE 1 TABLET(200 MG) BY MOUTH DAILY 90 tablet 3   aspirin EC 81 MG tablet Take by mouth.     atorvastatin (LIPITOR) 40 MG tablet TAKE 1 TABLET(40 MG) BY MOUTH  DAILY 90 tablet 0   clopidogrel (PLAVIX) 75 MG tablet TAKE 1 TABLET(75 MG) BY MOUTH DAILY 90 tablet 0   empagliflozin (JARDIANCE) 25 MG TABS tablet Take 1 tablet (25 mg total) by mouth daily before breakfast. 90 tablet 3   Evolocumab (REPATHA SURECLICK) 140 MG/ML SOAJ Inject 140 mg into the skin every 14 (fourteen) days. 6 mL 3   fluticasone (FLONASE) 50 MCG/ACT nasal spray Place 2 sprays into both nostrils daily. 16 g 6   furosemide (LASIX) 40 MG tablet Take 1 tablet (40 mg total) by mouth daily. 90 tablet 1   levothyroxine (SYNTHROID) 88 MCG tablet TAKE 1 TABLET(88 MCG) BY MOUTH DAILY BEFORE BREAKFAST 30 tablet 6   loratadine (CLARITIN) 10 MG tablet Take 10 mg by mouth daily as needed for allergies.     metoprolol succinate (TOPROL-XL) 25 MG 24 hr tablet TAKE 1 TABLET(25 MG) BY MOUTH AT BEDTIME 90 tablet 0   nitroGLYCERIN (NITROSTAT) 0.4 MG SL tablet Place 1 tablet  (0.4 mg total) under the tongue every 5 (five) minutes as needed for chest pain. 25 tablet 0   OVER THE COUNTER MEDICATION Place 1 spray into both nostrils as needed (nasal congestion). Xytiol     ranolazine (RANEXA) 500 MG 12 hr tablet Take 1 tablet (500 mg total) by mouth 2 (two) times daily. 180 tablet 2   spironolactone (ALDACTONE) 25 MG tablet TAKE 1/2 TABLET BY MOUTH EVERY NIGHT AT BEDTIME 90 tablet 0   No current facility-administered medications on file prior to visit.   Past Medical History:  Diagnosis Date   CAD (coronary artery disease)    Chronic kidney disease, stage 3a (HCC)    Chronic systolic CHF (congestive heart failure) (HCC)    Dilated aortic root (HCC) 12/05/2018   Hyperlipidemia 08/15/2020   Hypothyroidism    Left bundle branch block 09/14/2019   Old myocardial infarction 06/26/2011   Prediabetes 05/05/2010   Presence of cardiac defibrillator    S/P CABG (coronary artery bypass graft)    Ventricular tachycardia, monomorphic (HCC)    Past Surgical History:  Procedure Laterality Date   ABDOMINAL HERNIA REPAIR  2020   bypass  2001   CHOLECYSTECTOMY  1996   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   LEFT HEART CATH AND CORS/GRAFTS ANGIOGRAPHY N/A 01/07/2022   Procedure: LEFT HEART CATH AND CORS/GRAFTS ANGIOGRAPHY;  Surgeon: Lyn Records, MD;  [facility];  Service: Cardiovascular;  Laterality: N/A;    Family History  Problem Relation Age of Onset   Dementia Mother    Heart Problems Mother    Heart Problems Father    Social History   Socioeconomic History   Marital status: Married    Spouse name: Darl Pikes   Number of children: 3   Years of education: Not on file   Highest education level: Not on file  Occupational History   Not on file  Tobacco Use   Smoking status: Former    Current packs/day: 0.00    Types: Cigarettes    Quit date: 1970    Years since quitting: 54.6   Smokeless tobacco: Never  Vaping Use   Vaping status:  Never Used  Substance and Sexual Activity   Alcohol use: Not Currently   Drug use: Never   Sexual activity: Yes    Partners: Female  Other Topics Concern   Not on file  Social History Narrative   Retired - currently Armed forces operational officer at [employer].  Lives with wife, moved  back to [place] from [place] a couple if years ago.  Has one son and two daughters.   Social Determinants of Health   Financial Resource Strain: Low Risk  (08/04/2023)   Overall Financial Resource Strain (CARDIA)    Difficulty of Paying Living Expenses: Not hard at all  Food Insecurity: No Food Insecurity (07/21/2022)   Hunger Vital Sign    Worried About Running Out of Food in the Last Year: Never true    Ran Out of Food in the Last Year: Never true  Transportation Needs: No Transportation Needs (07/21/2022)   PRAPARE - Administrator, Civil Service (Medical): No    Lack of Transportation (Non-Medical): No  Physical Activity: Sufficiently Active (07/21/2022)   Exercise Vital Sign    Days of Exercise per Week: 7 days    Minutes of Exercise per Session: 60 min  Stress: No Stress Concern Present (07/21/2022)   [employer] of Occupational Health - Occupational Stress Questionnaire    Feeling of Stress : Not at all  Social Connections: Moderately Integrated (08/04/2023)   Social Connection and Isolation Panel [NHANES]    Frequency of Communication with Friends and Family: More than three times a week    Frequency of Social Gatherings with Friends and Family: More than three times a week    Attends Religious Services: More than 4 times per year    Active Member of [employer] or Organizations: No    Attends Banker Meetings: Never    Marital Status: Married    Objective:  BP 112/68   Pulse (!) 59   Temp (!) 97.2 F (36.2 C)   Ht 6\' 3"  (1.905 m)   Wt 244 lb (110.7 kg)   SpO2 99%   BMI 30.50 kg/m      08/04/2023    8:42 AM 07/26/2023    3:40 PM 03/17/2023    3:22 PM  BP/Weight  Systolic BP 112 114 95   Diastolic BP 68 73 59  Wt. (Lbs) 244 243   BMI 30.5 kg/m2 30.37 kg/m2     Physical Exam Vitals reviewed.  Constitutional:      Appearance: Normal appearance. He is normal weight.  HENT:     Head: Normocephalic and atraumatic.     Mouth/Throat:     Mouth: Mucous membranes are moist.     Pharynx: Oropharynx is clear.  Eyes:     Extraocular Movements: Extraocular movements intact.     Pupils: Pupils are equal, round, and reactive to light.  Cardiovascular:     Rate and Rhythm: Normal rate and regular rhythm.     Heart sounds: No murmur heard.    Comments: AICD present No JVD No pedal edema Pulmonary:     Effort: Pulmonary effort is normal.     Breath sounds: Normal breath sounds.  Abdominal:     General: Abdomen is flat. Bowel sounds are normal.     Palpations: Abdomen is soft.     Tenderness: There is no abdominal tenderness.  Musculoskeletal:        General: Normal range of motion.  Skin:    General: Skin is warm and dry.     Comments: Spider veins noted in legs  Neurological:     General: No focal deficit present.     Mental Status: He is alert and oriented to person, place, and time. Mental status is at baseline.  Psychiatric:        Mood and Affect: Mood normal.  Behavior: Behavior normal.     Diabetic Foot Exam - Simple   No data filed      Lab Results  Component Value Date   WBC 9.0 02/03/2023   HGB 14.9 02/03/2023   HCT 44.1 02/03/2023   PLT 224 02/03/2023   GLUCOSE 92 02/03/2023   CHOL 156 02/03/2023   TRIG 146 02/03/2023   HDL 45 02/03/2023   LDLCALC 85 02/03/2023   ALT 15 02/03/2023   AST 17 02/03/2023   NA 140 02/03/2023   K 4.6 02/03/2023   CL 101 02/03/2023   CREATININE 1.15 02/03/2023   BUN 16 02/03/2023   CO2 26 02/03/2023   TSH 4.940 (H) 02/03/2023   HGBA1C 5.7 (H) 02/03/2023      Assessment & Plan:    Atherosclerosis of native coronary artery of native heart with angina pectoris [facility]) Assessment & Plan: Doing well at  this time Denies any chest pain or shortness of breath with exertion Has been rationing his Jardiance. Okay to continue other guideline directed medical therapy with aspirin 81 mg daily, atorvastatin 40 mg daily, Lasix 40 mg daily and metoprolol succinate 25 mg daily.  Looks like he has been on Plavix chronically.  Will defer to cardiology if he should continue it indefinitely or not.  His last cardiac catheterization was in January 2023.  Orders: -     CBC with Differential/Platelet -     Comprehensive metabolic panel -     Lipid panel  Chronic systolic heart failure (HCC) Assessment & Plan: Well compensated at this time. Last echocardiogram done in January 2023 showed EF of 40 to 45% which is improved from before.  He has AICD in place. Continue current meds-  guideline directed medical therapy with aspirin 81 mg daily, atorvastatin 40 mg daily, Lasix 40 mg daily and metoprolol succinate 25 mg daily.  Orders: -     CBC with Differential/Platelet -     Comprehensive metabolic panel  Prediabetes Assessment & Plan: Last A1C was 5.7 in February 2024.  Will repeat A1C today Recommend to continue healthy low calorie diet and physical activity as tolerated On Jardiance for heart failure and not for prediabetes but he has not been able to afford it, has only 10 pills left. Since he is very motivated to continue his healthy lifestyle changes, it should be okay if he cannot go back on Jardiance.  Orders: -     Comprehensive metabolic panel -     Hemoglobin A1c  Hypothyroidism due to Hashimoto's thyroiditis Assessment & Plan: On 88 mcg of levothyroxine.  Will repeat TSH today  Orders: -     CBC with Differential/Platelet -     Comprehensive metabolic panel     No orders of the defined types were placed in this encounter.   Orders Placed This Encounter  Procedures   CBC with Differential/Platelet   Comprehensive metabolic panel   Lipid panel   Hemoglobin A1c      Follow-up: Return in about 6 months (around 02/04/2024) for chronic, chronic disease follow up.   I,Katherina A Bramblett,acting as a scribe for [employer], MD.,have documented all relevant documentation on the behalf of Windell Moment, MD,as directed by  Windell Moment, MD while in the presence of Windell Moment, MD.   An After Visit Summary was printed and given to the patient.  Windell Moment, MD [facility] [phone]

## 2023-08-04 NOTE — Assessment & Plan Note (Addendum)
Well compensated at this time. Last echocardiogram done in January 2023 showed EF of 40 to 45% which is improved from before.  He has AICD in place. Continue current meds-  guideline directed medical therapy with aspirin 81 mg daily, atorvastatin 40 mg daily, Lasix 40 mg daily and metoprolol succinate 25 mg daily.

## 2023-08-09 ENCOUNTER — Ambulatory Visit (INDEPENDENT_AMBULATORY_CARE_PROVIDER_SITE_OTHER): Payer: Medicare HMO

## 2023-08-09 VITALS — Wt 240.0 lb

## 2023-08-09 DIAGNOSIS — Z0000 Encounter for general adult medical examination without abnormal findings: Secondary | ICD-10-CM | POA: Diagnosis not present

## 2023-08-09 NOTE — Patient Instructions (Signed)
Bryan Miller , Thank you for taking time to come for your Medicare Wellness Visit. I appreciate your ongoing commitment to your health goals. Please review the following plan we discussed and let me know if I can assist you in the future.    This is a list of the screening recommended for you and due dates:  Health Maintenance  Topic Date Due   Hepatitis C Screening  Never done   Zoster (Shingles) Vaccine (1 of 2) 08/31/1969   Flu Shot  03/26/2024*   DTaP/Tdap/Td vaccine (2 - Td or Tdap) 10/04/2023   Medicare Annual Wellness Visit  08/08/2024   Cologuard (Stool DNA test)  05/28/2026   Pneumonia Vaccine  Completed   HPV Vaccine  Aged Out   COVID-19 Vaccine  Discontinued  *Topic was postponed. The date shown is not the original due date.    Advanced directives: Please bring a copy for your medical record   Tetanus vaccine will be due in October.  You can get that as well as the Shingrix vaccine at the pharmacy.   Preventive Care 9 Years and Older, Male  Preventive care refers to lifestyle choices and visits with your health care provider that can promote health and wellness. What does preventive care include? A yearly physical exam. This is also called an annual well check. Dental exams once or twice a year. Routine eye exams. Ask your health care provider how often you should have your eyes checked. Personal lifestyle choices, including: Daily care of your teeth and gums. Regular physical activity. Eating a healthy diet. Avoiding tobacco and drug use. Limiting alcohol use. Practicing safe sex. Taking low doses of aspirin every day. Taking vitamin and mineral supplements as recommended by your health care provider. What happens during an annual well check? The services and screenings done by your health care provider during your annual well check will depend on your age, overall health, lifestyle risk factors, and family history of disease. Counseling  Your health care  provider may ask you questions about your: Alcohol use. Tobacco use. Drug use. Emotional well-being. Home and relationship well-being. Sexual activity. Eating habits. History of falls. Memory and ability to understand (cognition). Work and work Astronomer. Screening  You may have the following tests or measurements: Height, weight, and BMI. Blood pressure. Lipid and cholesterol levels. These may be checked every 5 years, or more frequently if you are over 69 years old. Skin check. Lung cancer screening. You may have this screening every year starting at age 78 if you have a 30-pack-year history of smoking and currently smoke or have quit within the past 15 years. Fecal occult blood test (FOBT) of the stool. You may have this test every year starting at age 59. Flexible sigmoidoscopy or colonoscopy. You may have a sigmoidoscopy every 5 years or a colonoscopy every 10 years starting at age 71. Prostate cancer screening. Recommendations will vary depending on your family history and other risks. Hepatitis C blood test. Hepatitis B blood test. Sexually transmitted disease (STD) testing. Diabetes screening. This is done by checking your blood sugar (glucose) after you have not eaten for a while (fasting). You may have this done every 1-3 years. Abdominal aortic aneurysm (AAA) screening. You may need this if you are a current or former smoker. Osteoporosis. You may be screened starting at age 63 if you are at high risk. Talk with your health care provider about your test results, treatment options, and if necessary, the need for more tests. Vaccines  Your health care provider may recommend certain vaccines, such as: Influenza vaccine. This is recommended every year. Tetanus, diphtheria, and acellular pertussis (Tdap, Td) vaccine. You may need a Td booster every 10 years. Zoster vaccine. You may need this after age 35. Pneumococcal 13-valent conjugate (PCV13) vaccine. One dose is  recommended after age 75. Pneumococcal polysaccharide (PPSV23) vaccine. One dose is recommended after age 21. Talk to your health care provider about which screenings and vaccines you need and how often you need them. This information is not intended to replace advice given to you by your health care provider. Make sure you discuss any questions you have with your health care provider. Document Released: 01/09/2016 Document Revised: 09/01/2016 Document Reviewed: 10/14/2015 Elsevier Interactive Patient Education  2017 [employer].  Fall Prevention in the Home Falls can cause injuries. They can happen to people of all ages. There are many things you can do to make your home safe and to help prevent falls. What can I do on the outside of my home? Regularly fix the edges of walkways and driveways and fix any cracks. Remove anything that might make you trip as you walk through a door, such as a raised step or threshold. Trim any bushes or trees on the path to your home. Use bright outdoor lighting. Clear any walking paths of anything that might make someone trip, such as rocks or tools. Regularly check to see if handrails are loose or broken. Make sure that both sides of any steps have handrails. Any raised decks and porches should have guardrails on the edges. Have any leaves, snow, or ice cleared regularly. Use sand or salt on walking paths during winter. Clean up any spills in your garage right away. This includes oil or grease spills. What can I do in the bathroom? Use night lights. Install grab bars by the toilet and in the tub and shower. Do not use towel bars as grab bars. Use non-skid mats or decals in the tub or shower. If you need to sit down in the shower, use a plastic, non-slip stool. Keep the floor dry. Clean up any water that spills on the floor as soon as it happens. Remove soap buildup in the tub or shower regularly. Attach bath mats securely with double-sided non-slip rug  tape. Do not have throw rugs and other things on the floor that can make you trip. What can I do in the bedroom? Use night lights. Make sure that you have a light by your bed that is easy to reach. Do not use any sheets or blankets that are too big for your bed. They should not hang down onto the floor. Have a firm chair that has side arms. You can use this for support while you get dressed. Do not have throw rugs and other things on the floor that can make you trip. What can I do in the kitchen? Clean up any spills right away. Avoid walking on wet floors. Keep items that you use a lot in easy-to-reach places. If you need to reach something above you, use a strong step stool that has a grab bar. Keep electrical cords out of the way. Do not use floor polish or wax that makes floors slippery. If you must use wax, use non-skid floor wax. Do not have throw rugs and other things on the floor that can make you trip. What can I do with my stairs? Do not leave any items on the stairs. Make sure that there are  handrails on both sides of the stairs and use them. Fix handrails that are broken or loose. Make sure that handrails are as long as the stairways. Check any carpeting to make sure that it is firmly attached to the stairs. Fix any carpet that is loose or worn. Avoid having throw rugs at the top or bottom of the stairs. If you do have throw rugs, attach them to the floor with carpet tape. Make sure that you have a light switch at the top of the stairs and the bottom of the stairs. If you do not have them, ask someone to add them for you. What else can I do to help prevent falls? Wear shoes that: Do not have high heels. Have rubber bottoms. Are comfortable and fit you well. Are closed at the toe. Do not wear sandals. If you use a stepladder: Make sure that it is fully opened. Do not climb a closed stepladder. Make sure that both sides of the stepladder are locked into place. Ask someone to  hold it for you, if possible. Clearly mark and make sure that you can see: Any grab bars or handrails. First and last steps. Where the edge of each step is. Use tools that help you move around (mobility aids) if they are needed. These include: Canes. Walkers. Scooters. Crutches. Turn on the lights when you go into a dark area. Replace any light bulbs as soon as they burn out. Set up your furniture so you have a clear path. Avoid moving your furniture around. If any of your floors are uneven, fix them. If there are any pets around you, be aware of where they are. Review your medicines with your doctor. Some medicines can make you feel dizzy. This can increase your chance of falling. Ask your doctor what other things that you can do to help prevent falls. This information is not intended to replace advice given to you by your health care provider. Make sure you discuss any questions you have with your health care provider. Document Released: 10/09/2009 Document Revised: 05/20/2016 Document Reviewed: 01/17/2015 Elsevier Interactive Patient Education  2017 [employer].

## 2023-08-09 NOTE — Progress Notes (Signed)
Subjective:   Bryan Miller is a 72 y.o. male who presents for Medicare Annual/Subsequent preventive examination.  This wellness visit is conducted by a nurse.  The patient's medications were reviewed and reconciled since the patient's last visit.  History details were provided by the patient.  The history appears to be reliable.    Medical History: Patient history and Family history was reviewed  Medications, Allergies, and preventative health maintenance was reviewed and updated.   Visit Complete: Virtual  I connected with  Bryan Miller on 08/09/23 by a audio enabled telemedicine application and verified that I am speaking with the correct person using two identifiers.  Patient Location: Home  Provider Location: Office/Clinic  I discussed the limitations of evaluation and management by telemedicine. The patient expressed understanding and agreed to proceed.  Vital Signs: Unable to obtain new vitals (other than weight) due to this being a telehealth visit.  Patient reports that there has been no change since his recent appointment.  Cardiac Risk Factors include: advanced age (>57men, >35 women);obesity (BMI >30kg/m2);male gender     Objective:    Today's Vitals   08/09/23 0929  Weight: 240 lb (108.9 kg)  PainSc: 0-No pain   Body mass index is 30 kg/m.     01/08/2022    4:00 PM  Advanced Directives  Does Patient Have a Medical Advance Directive? Yes  Type of Advance Directive Living will;Healthcare Power of Attorney  Does patient want to make changes to medical advance directive? No - Patient declined  Copy of Healthcare Power of Attorney in Chart? No - copy requested    Current Medications (verified) Outpatient Encounter Medications as of 08/09/2023  Medication Sig   acetaminophen (TYLENOL) 650 MG CR tablet Take 650 mg by mouth every 8 (eight) hours as needed for pain.   albuterol (VENTOLIN HFA) 108 (90 Base) MCG/ACT inhaler Inhale into the lungs.    amiodarone (PACERONE) 200 MG tablet TAKE 1 TABLET(200 MG) BY MOUTH DAILY   aspirin EC 81 MG tablet Take by mouth.   atorvastatin (LIPITOR) 40 MG tablet TAKE 1 TABLET(40 MG) BY MOUTH DAILY   clopidogrel (PLAVIX) 75 MG tablet TAKE 1 TABLET(75 MG) BY MOUTH DAILY   empagliflozin (JARDIANCE) 25 MG TABS tablet Take 1 tablet (25 mg total) by mouth daily before breakfast.   Evolocumab (REPATHA SURECLICK) 140 MG/ML SOAJ Inject 140 mg into the skin every 14 (fourteen) days.   fluticasone (FLONASE) 50 MCG/ACT nasal spray Place 2 sprays into both nostrils daily.   furosemide (LASIX) 40 MG tablet Take 1 tablet (40 mg total) by mouth daily.   levothyroxine (SYNTHROID) 88 MCG tablet TAKE 1 TABLET(88 MCG) BY MOUTH DAILY BEFORE BREAKFAST   loratadine (CLARITIN) 10 MG tablet Take 10 mg by mouth daily as needed for allergies.   metoprolol succinate (TOPROL-XL) 25 MG 24 hr tablet TAKE 1 TABLET(25 MG) BY MOUTH AT BEDTIME   nitroGLYCERIN (NITROSTAT) 0.4 MG SL tablet Place 1 tablet (0.4 mg total) under the tongue every 5 (five) minutes as needed for chest pain.   OVER THE COUNTER MEDICATION Place 1 spray into both nostrils as needed (nasal congestion). Xytiol   ranolazine (RANEXA) 500 MG 12 hr tablet Take 1 tablet (500 mg total) by mouth 2 (two) times daily.   spironolactone (ALDACTONE) 25 MG tablet TAKE 1/2 TABLET BY MOUTH EVERY NIGHT AT BEDTIME   No facility-administered encounter medications on file as of 08/09/2023.    Allergies (verified) Patient has no known allergies.  History: Past Medical History:  Diagnosis Date   CAD (coronary artery disease)    Chronic kidney disease, stage 3a (HCC)    Chronic systolic CHF (congestive heart failure) (HCC)    Dilated aortic root (HCC) 12/05/2018   Hyperlipidemia 08/15/2020   Hypothyroidism    Left bundle branch block 09/14/2019   Old myocardial infarction 06/26/2011   Prediabetes 05/05/2010   Presence of cardiac defibrillator    S/P CABG (coronary artery  bypass graft)    Ventricular tachycardia, monomorphic (HCC)    Past Surgical History:  Procedure Laterality Date   ABDOMINAL HERNIA REPAIR  2020   bypass  2001   CHOLECYSTECTOMY  1996   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   LEFT HEART CATH AND CORS/GRAFTS ANGIOGRAPHY N/A 01/07/2022   Procedure: LEFT HEART CATH AND CORS/GRAFTS ANGIOGRAPHY;  Surgeon: Lyn Records, MD;  Location: [facility];  Service: Cardiovascular;  Laterality: N/A;   Family History  Problem Relation Age of Onset   Dementia Mother    Heart Problems Mother    Heart Problems Father    Social History   Socioeconomic History   Marital status: Married    Spouse name: Darl Pikes   Number of children: 3   Years of education: Not on file   Highest education level: Not on file  Occupational History   Not on file  Tobacco Use   Smoking status: Former    Current packs/day: 0.00    Types: Cigarettes    Quit date: 1970    Years since quitting: 54.6   Smokeless tobacco: Never  Vaping Use   Vaping status: Never Used  Substance and Sexual Activity   Alcohol use: Not Currently   Drug use: Never   Sexual activity: Yes    Partners: Female  Other Topics Concern   Not on file  Social History Narrative   Retired - currently Armed forces operational officer at [employer].  Lives with wife, moved back to [place] from [place] a few if years ago.  Has one son and two daughters.   Social Determinants of Health   Financial Resource Strain: Low Risk  (08/04/2023)   Overall Financial Resource Strain (CARDIA)    Difficulty of Paying Living Expenses: Not hard at all  Food Insecurity: No Food Insecurity (08/09/2023)   Hunger Vital Sign    Worried About Running Out of Food in the Last Year: Never true    Ran Out of Food in the Last Year: Never true  Transportation Needs: No Transportation Needs (08/09/2023)   PRAPARE - Administrator, Civil Service (Medical): No    Lack of Transportation (Non-Medical): No  Physical Activity:  Sufficiently Active (08/09/2023)   Exercise Vital Sign    Days of Exercise per Week: 7 days    Minutes of Exercise per Session: 60 min  Stress: No Stress Concern Present (08/09/2023)   [employer] of Occupational Health - Occupational Stress Questionnaire    Feeling of Stress : Not at all  Social Connections: Moderately Integrated (08/04/2023)   Social Connection and Isolation Panel [NHANES]    Frequency of Communication with Friends and Family: More than three times a week    Frequency of Social Gatherings with Friends and Family: More than three times a week    Attends Religious Services: More than 4 times per year    Active Member of [employer] or Organizations: No    Attends Banker Meetings: Never    Marital Status: Married  Tobacco Counseling Counseling given: Not Answered   Clinical Intake:  Pre-visit preparation completed: Yes  Pain : No/denies pain Pain Score: 0-No pain     BMI - recorded: 30.5 Nutritional Status: BMI > 30  Obese Nutritional Risks: None Diabetes: Yes (most recent A1C 5.9) CBG done?: No  How often do you need to have someone help you when you read instructions, pamphlets, or other written materials from your doctor or pharmacy?: 1 - Never  Interpreter Needed?: No      Activities of Daily Living    08/09/2023    9:40 AM  In your present state of health, do you have any difficulty performing the following activities:  Hearing? 0  Vision? 0  Difficulty concentrating or making decisions? 0  Walking or climbing stairs? 0  Dressing or bathing? 0  Doing errands, shopping? 0  Preparing Food and eating ? N  Using the Toilet? N  In the past six months, have you accidently leaked urine? N  Do you have problems with loss of bowel control? N  Managing your Medications? N  Managing your Finances? N  Housekeeping or managing your Housekeeping? N    Patient Care Team: Blane Ohara, MD as PCP - General (Family Medicine) Duke Salvia, MD as Consulting Physician (Cardiology)  Indicate any recent Medical Services you may have received from other than [facility] providers in the past year (date may be approximate).     Assessment:   This is a routine wellness examination for [place].  Hearing/Vision screen No results found.  Dietary issues and exercise activities discussed:     Goals Addressed   None    Depression Screen    08/09/2023    9:21 AM 08/04/2023    8:43 AM 01/27/2023    3:54 PM 07/21/2022   10:52 AM 04/28/2022    9:48 AM 03/18/2021    3:08 PM 03/18/2021    3:03 PM  PHQ 2/9 Scores  PHQ - 2 Score 0 0 0 0 0 0 0  PHQ- 9 Score 0 1         Fall Risk    08/09/2023    9:40 AM 08/04/2023    8:43 AM 01/27/2023    3:47 PM 07/21/2022   10:54 AM 04/28/2022    9:47 AM  Fall Risk   Falls in the past year? 1 1 0 0 0  Number falls in past yr: 0 0 0 0 0  Injury with Fall? 1 1 0 0 0  Risk for fall due to : History of fall(s) No Fall Risks No Fall Risks No Fall Risks Impaired balance/gait  Follow up Falls evaluation completed Falls evaluation completed Falls evaluation completed Falls evaluation completed Education provided    MEDICARE RISK AT HOME:  Medicare Risk at Home - 08/09/23 0918     Any stairs in or around the home? Yes    If so, are there any without handrails? No    Home free of loose throw rugs in walkways, pet beds, electrical cords, etc? Yes    Adequate lighting in your home to reduce risk of falls? Yes    Life alert? No    Use of a cane, walker or w/c? No    Grab bars in the bathroom? Yes    Shower chair or bench in shower? Yes    Elevated toilet seat or a handicapped toilet? No             TIMED UP AND GO:  Was the test performed?  No    Cognitive Function:        08/09/2023    9:41 AM 07/21/2022   10:55 AM  6CIT Screen  What Year? 0 points 0 points  What month? 0 points 0 points  What time? 0 points 0 points  Count back from 20 0 points 0 points  Months in reverse 0 points 0 points   Repeat phrase 0 points 0 points  Total Score 0 points 0 points    Immunizations Immunization History  Administered Date(s) Administered   Influenza, Seasonal, Injecte, Preservative Fre 01/06/2012   Influenza,inj,Quad PF,6+ Mos 11/30/2012, 10/03/2013, 09/21/2016, 06/18/2019   Influenza,inj,quad, With Preservative 10/10/2015   PPD Test 10/27/2012   Pneumococcal Conjugate-13 10/10/2015   Pneumococcal Polysaccharide-23 05/04/2010, 01/25/2017   Tdap 10/03/2013   Zoster, Live 08/08/2018    TDAP status: Up to date  Flu Vaccine status: Declined, Education has been provided regarding the importance of this vaccine but patient still declined. Advised may receive this vaccine at local pharmacy or Health Dept. Aware to provide a copy of the vaccination record if obtained from local pharmacy or Health Dept. Verbalized acceptance and understanding.  Pneumococcal vaccine status: Up to date  Covid-19 vaccine status: Declined, Education has been provided regarding the importance of this vaccine but patient still declined. Advised may receive this vaccine at local pharmacy or Health Dept.or vaccine clinic. Aware to provide a copy of the vaccination record if obtained from local pharmacy or Health Dept. Verbalized acceptance and understanding.  Qualifies for Shingles Vaccine? Yes   Zostavax completed Yes   Shingrix Completed?: No.    Education has been provided regarding the importance of this vaccine. Patient has been advised to call insurance company to determine out of pocket expense if they have not yet received this vaccine. Advised may also receive vaccine at local pharmacy or Health Dept. Verbalized acceptance and understanding.  Screening Tests Health Maintenance  Topic Date Due   Hepatitis C Screening  Never done   Zoster Vaccines- Shingrix (1 of 2) 08/31/1969   Medicare Annual Wellness (AWV)  07/22/2023   INFLUENZA VACCINE  03/26/2024 (Originally 07/28/2023)   DTaP/Tdap/Td (2 - Td or Tdap)  10/04/2023   Fecal DNA (Cologuard)  05/28/2026   Pneumonia Vaccine 84+ Years old  Completed   HPV VACCINES  Aged Out   COVID-19 Vaccine  Discontinued    Health Maintenance  Health Maintenance Due  Topic Date Due   Hepatitis C Screening  Never done   Zoster Vaccines- Shingrix (1 of 2) 08/31/1969   Medicare Annual Wellness (AWV)  07/22/2023    Colorectal cancer screening: Type of screening: Cologuard. Completed 05/2023. Repeat every 3 years  Lung Cancer Screening: (Low Dose CT Chest recommended if Age 35-80 years, 20 pack-year currently smoking OR have quit w/in 15years.) does not qualify.   Lung Cancer Screening Referral: N/A  Additional Screening:  Vision Screening: Recommended annual ophthalmology exams for early detection of glaucoma and other disorders of the eye. Is the patient up to date with their annual eye exam?  Yes  Who is the provider or what is the name of the office in which the patient attends annual eye exams? [facility], [facility]  Dental Screening: Recommended annual dental exams for proper oral hygiene  Community Resource Referral / Chronic Care Management: CRR required this visit?  No   CCM required this visit?  No     Plan:    Counseling was provided today regarding the  following topics: healthy eating habits, home safety, vitamin and mineral supplementation, regular exercise, tobacco avoidance, limitation of alcohol intake, use of seat belts, firearm safety, and fall prevention.  Annual recommendations include: influenza vaccine, dental cleanings, and eye exams.   I have personally reviewed and noted the following in the patient's chart:   Medical and social history Use of alcohol, tobacco or illicit drugs  Current medications and supplements including opioid prescriptions. Patient is not currently taking opioid prescriptions. Functional ability and status Nutritional status Physical activity Advanced directives List of other  physicians Hospitalizations, surgeries, and ER visits in previous 12 months Vitals Screenings to include cognitive, depression, and falls Referrals and appointments  In addition, I have reviewed and discussed with patient certain preventive protocols, quality metrics, and best practice recommendations. A written personalized care plan for preventive services as well as general preventive health recommendations were provided to patient.     Jacklynn Bue, LPN   0/27/2536   After Visit Summary: (MyChart) Due to this being a telephonic visit, the after visit summary with patients personalized plan was offered to patient via MyChart

## 2023-08-11 ENCOUNTER — Encounter: Payer: Self-pay | Admitting: Internal Medicine

## 2023-08-11 ENCOUNTER — Ambulatory Visit: Payer: Medicare HMO | Attending: Internal Medicine | Admitting: Internal Medicine

## 2023-08-11 VITALS — BP 81/59 | HR 70 | Ht 75.0 in | Wt 244.6 lb

## 2023-08-11 DIAGNOSIS — I4729 Other ventricular tachycardia: Secondary | ICD-10-CM

## 2023-08-11 NOTE — Patient Instructions (Signed)
Medication Instructions:  STOP Plavix (clopidogrel)  *If you need a refill on your cardiac medications before your next appointment, please call your pharmacy*   Lab Work: TSH - [facility] in [place] If you have labs (blood work) drawn today and your tests are completely normal, you will receive your results only by: [employer] (if you have MyChart) OR A paper copy in the mail If you have any lab test that is abnormal or we need to change your treatment, we will call you to review the results.   Follow-Up: At [facility], you and your health needs are our priority.  As part of our continuing mission to provide you with exceptional heart care, we have created designated Provider Care Teams.  These Care Teams include your primary Cardiologist (physician) and Advanced Practice Providers (APPs -  Physician Assistants and Nurse Practitioners) who all work together to provide you with the care you need, when you need it.  We recommend signing up for the patient portal called "MyChart".  Sign up information is provided on this After Visit Summary.  MyChart is used to connect with patients for Virtual Visits (Telemedicine).  Patients are able to view lab/test results, encounter notes, upcoming appointments, etc.  Non-urgent messages can be sent to your provider as well.   To learn more about what you can do with MyChart, go to [email or web address].    Your next appointment:   6 month(s)  Provider:   Sherryl Manges, MD

## 2023-08-11 NOTE — Progress Notes (Signed)
Patient Care Team: Blane Ohara, MD as PCP - General (Family Medicine) Duke Salvia, MD as Consulting Physician (Cardiology)   HPI  Bryan Miller is a 72 y.o. male seen in follow-up for ventricular tachycardia in the context of ischemic heart disease and prior bypass surgery with remotely implanted CRT-D [facility] [employer]   Amio started and ranolazine    The patient denies chest pain, shortness of breat, nocturnal dyspnea, orthopnea .  There have been no palpitations or syncope.  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   Patient denies symptoms of respiratory, GI intolerance, sun sensitivity, neurological symptoms attributable to amiodarone.       DATE TEST EF   9/21 Echo   35-40 %   1/23 Echo  40-45%   1/23 LHC  LIMA-LAD p; SVG-RCA-100; OM 50%   Date Cr K Hgb TSH LFTs  2/23 1.08 4.6 14.7 0.116 (levothyroxine dose decreased) 22   9/23 1.12 4.3 15.5 5.9 21  2/24    4.94   8/24 1.19 4.8 16.5  21 welcome            Records and Results Reviewed   Past Medical History:  Diagnosis Date   CAD (coronary artery disease)    Chronic kidney disease, stage 3a (HCC)    Chronic systolic CHF (congestive heart failure) (HCC)    Dilated aortic root (HCC) 12/05/2018   Hyperlipidemia 08/15/2020   Hypothyroidism    Left bundle branch block 09/14/2019   Old myocardial infarction 06/26/2011   Prediabetes 05/05/2010   Presence of cardiac defibrillator    S/P CABG (coronary artery bypass graft)    Ventricular tachycardia, monomorphic (HCC)     Past Surgical History:  Procedure Laterality Date   ABDOMINAL HERNIA REPAIR  2020   bypass  2001   CHOLECYSTECTOMY  1996   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   LEFT HEART CATH AND CORS/GRAFTS ANGIOGRAPHY N/A 01/07/2022   Procedure: LEFT HEART CATH AND CORS/GRAFTS ANGIOGRAPHY;   Surgeon: Lyn Records, MD;  Location: [facility];  Service: Cardiovascular;  Laterality: N/A;    Current Meds  Medication Sig   acetaminophen (TYLENOL) 500 MG tablet Take 500 mg by mouth at bedtime.   albuterol (VENTOLIN HFA) 108 (90 Base) MCG/ACT inhaler Inhale into the lungs.   amiodarone (PACERONE) 200 MG tablet TAKE 1 TABLET(200 MG) BY MOUTH DAILY (Patient taking differently: TAKE 1 TABLET(200 MG) BY MOUTH MONDAY - FRIDAY)   aspirin EC 81 MG tablet Take 81 mg by mouth daily.   atorvastatin (LIPITOR) 40 MG tablet TAKE 1 TABLET(40 MG) BY MOUTH DAILY   clopidogrel (PLAVIX) 75 MG tablet TAKE 1 TABLET(75 MG) BY MOUTH DAILY   Evolocumab (REPATHA SURECLICK) 140 MG/ML SOAJ Inject 140 mg into the skin every 14 (fourteen) days.   fluticasone (FLONASE) 50 MCG/ACT nasal spray Place 2 sprays into both nostrils daily. (Patient taking differently: Place 2 sprays into both nostrils as needed.)   furosemide (LASIX) 40 MG tablet Take 1 tablet (40 mg total) by mouth daily.   levothyroxine (SYNTHROID) 88 MCG tablet TAKE 1 TABLET(88 MCG) BY MOUTH DAILY BEFORE BREAKFAST   loratadine (CLARITIN) 10 MG tablet Take 10 mg by mouth daily as needed for allergies.   metoprolol succinate (TOPROL-XL) 25 MG 24 hr tablet TAKE 1 TABLET(25 MG) BY MOUTH AT BEDTIME   nitroGLYCERIN (NITROSTAT)  0.4 MG SL tablet Place 1 tablet (0.4 mg total) under the tongue every 5 (five) minutes as needed for chest pain.   OVER THE COUNTER MEDICATION Place 1 spray into both nostrils as needed (nasal congestion). Xytiol   ranolazine (RANEXA) 500 MG 12 hr tablet Take 1 tablet (500 mg total) by mouth 2 (two) times daily.   spironolactone (ALDACTONE) 25 MG tablet TAKE 1/2 TABLET BY MOUTH EVERY NIGHT AT BEDTIME    No Known Allergies    Review of Systems negative except from HPI and PMH  Physical Exam BP (!) 81/59 (Patient Position: Standing)   Pulse 70   Ht 6\' 3"  (1.905 m)   Wt 244 lb 9.6 oz (110.9 kg)   SpO2 98%   BMI 30.57  kg/m    Well developed and well nourished in no acute distress HENT normal Neck supple with JVP-flat Clear Device pocket well healed; without hematoma or erythema.  There is no tethering  Regular rate and rhythm, no  gallop No  murmur Abd-soft with active BS No Clubbing cyanosis 1+ edema Skin-warm and dry A & Oriented  Grossly normal sensory and motor function  ECG P synchronous pacing with a negative QRS lead I and upright QRS lead V1  Device function is normal. Programming changes none  See Paceart for details    Estimated Creatinine Clearance: 75.5 mL/min (by C-G formula based on SCr of 1.19 mg/dL).   Assessment and  Plan  Ventricular tachycardia-monomorphic-recurrent treated with ATP   Presyncope with the above   Coronary artery disease with prior bypass   Aborted cardiac arrest   CRT-D-[employer]    Systolic heart failure-chronic-systolic  High Risk Medication Surveillance-amiodarone  Polycythemia   No intercurrent sustained ventricular tachycardia, nonsustained VT.  Continue amiodarone and metoprolol Need to check TSH on amiodarone.  Liver function tests were normal.  Mild volume overload.  Problem with his low blood pressure however is that I fear orthostasis more than I fear the low-grade edema.  Will use spironolactone but will hold off on using furosemide.  For his cardiomyopathy, continue metoprolol and spironolactone.  He is now post bypass.  We will stop his Plavix and continue on long-term aspirin     Discussed strategies to mitigate orthostatic hypotension

## 2023-08-12 ENCOUNTER — Encounter: Payer: Self-pay | Admitting: Emergency Medicine

## 2023-08-12 ENCOUNTER — Telehealth: Payer: Self-pay | Admitting: Emergency Medicine

## 2023-08-12 ENCOUNTER — Ambulatory Visit: Payer: Medicare HMO | Admitting: Emergency Medicine

## 2023-08-12 VITALS — BP 90/60 | HR 62 | Ht 75.0 in | Wt 242.6 lb

## 2023-08-12 DIAGNOSIS — R9389 Abnormal findings on diagnostic imaging of other specified body structures: Secondary | ICD-10-CM | POA: Diagnosis not present

## 2023-08-12 NOTE — Patient Instructions (Signed)
We reviewed your CT scan of the chest today. Will plan to repeat your CT chest in November 2024 at [facility] Please follow Dr. Delton Coombes in November after that scan so we can review the results together.

## 2023-08-12 NOTE — Progress Notes (Signed)
Subjective:    Patient ID: Bryan Miller, male    DOB: [birth year], 72 y.o.   MRN: [id number]  HPI  72 year old former smoker (1-2 pack years) with a history of CAD/CABG, chronic systolic CHF with VT and a defibrillator, hyperlipidemia, hypothyroidism.  He is referred today for an abnormal chest imaging.  A CT chest and trauma series was done 05/08/2023 [facility].  I have the report available but not the images.  Mediastinum showed subcentimeter mediastinal nodes.  There was a 4 x 9 mm solid nodule in the posterior right upper lobe abutting the fissure, moderate central and lower lobe traction bronchiectasis and diffuse airway thickening with some associated mucous plugging, tree-in-bud.  Calcified granuloma in the superior segment of the left lower lobe. Occasional SOB w exertion, no cough.   Review of Systems As per HPi  Past Medical History:  Diagnosis Date   CAD (coronary artery disease)    Chronic kidney disease, stage 3a (HCC)    Chronic systolic CHF (congestive heart failure) (HCC)    Dilated aortic root (HCC) 12/05/2018   Hyperlipidemia 08/15/2020   Hypothyroidism    Left bundle branch block 09/14/2019   Old myocardial infarction 06/26/2011   Prediabetes 05/05/2010   Presence of cardiac defibrillator    S/P CABG (coronary artery bypass graft)    Ventricular tachycardia, monomorphic (HCC)      Family History  Problem Relation Age of Onset   Dementia Mother    Heart Problems Mother    Heart Problems Father      Social History   Socioeconomic History   Marital status: Married    Spouse name: Darl Pikes   Number of children: 3   Years of education: Not on file   Highest education level: Not on file  Occupational History   Not on file  Tobacco Use   Smoking status: Former    Current packs/day: 0.00    Types: Cigarettes    Quit date: 1970    Years since quitting: 54.6   Smokeless tobacco: Never  Vaping Use   Vaping status: Never Used  Substance and  Sexual Activity   Alcohol use: Not Currently   Drug use: Never   Sexual activity: Yes    Partners: Female  Other Topics Concern   Not on file  Social History Narrative   Retired - currently Armed forces operational officer at [employer].  Lives with wife, moved back to [place] from [place] a few if years ago.  Has one son and two daughters.   Social Determinants of Health   Financial Resource Strain: Low Risk  (08/04/2023)   Overall Financial Resource Strain (CARDIA)    Difficulty of Paying Living Expenses: Not hard at all  Food Insecurity: No Food Insecurity (08/09/2023)   Hunger Vital Sign    Worried About Running Out of Food in the Last Year: Never true    Ran Out of Food in the Last Year: Never true  Transportation Needs: No Transportation Needs (08/09/2023)   PRAPARE - Administrator, Civil Service (Medical): No    Lack of Transportation (Non-Medical): No  Physical Activity: Sufficiently Active (08/09/2023)   Exercise Vital Sign    Days of Exercise per Week: 7 days    Minutes of Exercise per Session: 60 min  Stress: No Stress Concern Present (08/09/2023)   [employer] of Occupational Health - Occupational Stress Questionnaire    Feeling of Stress : Not at all  Social Connections: Moderately Integrated (08/04/2023)  Social Advertising account executive [NHANES]    Frequency of Communication with Friends and Family: More than three times a week    Frequency of Social Gatherings with Friends and Family: More than three times a week    Attends Religious Services: More than 4 times per year    Active Member of [employer] or Organizations: No    Attends Banker Meetings: Never    Marital Status: Married  Catering manager Violence: Not At Risk (08/09/2023)   Humiliation, Afraid, Rape, and Kick questionnaire    Fear of Current or Ex-Partner: No    Emotionally Abused: No    Physically Abused: No    Sexually Abused: No    Has lived in [place], , [place], [place] Teaches school, 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     albuterol (VENTOLIN HFA) 108 (90 Base) MCG/ACT inhaler Inhale into the lungs.     amiodarone (PACERONE) 200 MG tablet TAKE 1 TABLET(200 MG) BY MOUTH DAILY (Patient taking differently: TAKE 1 TABLET(200 MG) BY MOUTH MONDAY - FRIDAY) 90 tablet 3   aspirin EC 81 MG tablet Take 81 mg by mouth daily.     atorvastatin (LIPITOR) 40 MG tablet TAKE 1 TABLET(40 MG) BY MOUTH DAILY 90 tablet 0   empagliflozin (JARDIANCE) 25 MG TABS tablet Take 1 tablet (25 mg total) by mouth daily before breakfast. 90 tablet 3   Evolocumab (REPATHA SURECLICK) 140 MG/ML SOAJ Inject 140 mg into the skin every 14 (fourteen) days. 6 mL 3   fluticasone (FLONASE) 50 MCG/ACT nasal spray Place 2 sprays into both nostrils daily. (Patient taking differently: Place 2 sprays into both nostrils as needed.) 16 g 6   furosemide (LASIX) 40 MG tablet Take 1 tablet (40 mg total) by mouth daily. 90 tablet 1   levothyroxine (SYNTHROID) 88 MCG tablet TAKE 1 TABLET(88 MCG) BY MOUTH DAILY BEFORE BREAKFAST 30 tablet 6   loratadine (CLARITIN) 10 MG tablet Take 10 mg by mouth daily as needed for allergies.     metoprolol succinate (TOPROL-XL) 25 MG 24 hr tablet TAKE 1 TABLET(25 MG) BY MOUTH AT BEDTIME 90 tablet 0   nitroGLYCERIN (NITROSTAT) 0.4 MG SL tablet Place 1 tablet (0.4 mg total) under the tongue every 5 (five) minutes as needed for chest pain. 25 tablet 0   OVER THE COUNTER MEDICATION Place 1 spray into both nostrils as needed (nasal congestion). Xytiol     ranolazine (RANEXA) 500 MG 12 hr tablet Take 1 tablet (500 mg total) by mouth 2 (two) times daily. 180 tablet 2   spironolactone (ALDACTONE) 25 MG tablet TAKE 1/2 TABLET BY MOUTH EVERY NIGHT AT BEDTIME 90 tablet 0   No facility-administered medications prior to visit.          Objective:   Physical Exam Vitals:   08/12/23 0838  BP: 90/60  Pulse: 62  SpO2: 99%  Weight: 242 lb 9.6 oz (110 kg)  Height: 6\' 3"  (1.905 m)   Gen: Pleasant, well-nourished, in no distress,  normal affect  ENT: No lesions,  mouth clear,  oropharynx clear, no postnasal drip  Neck: No JVD, no stridor  Lungs: No use of accessory muscles, no crackles or wheezing on normal respiration, no wheeze on forced expiration  Cardiovascular: RRR, heart sounds  normal, no murmur or gallops, no peripheral edema  Musculoskeletal: No deformities, no cyanosis or clubbing  Neuro: alert, awake, non focal  Skin: Warm, no lesions or rash       Assessment & Plan:  Abnormal CT of the chest Some calcified granulomas, right upper lobe noncalcified nodule with focal left basilar airway thickening and evidence of prior inflammatory change, bronchiectasis.  He does not have any bronchiectatic symptoms currently.  He was in [place], [place], [place], suspect this represents granulomatous disease.  The nodule will need to be followed based on size for 2 years.  We will perform a repeat CT chest in November at [place].   Levy Pupa, MD, PhD 08/12/2023, 9:09 AM [facility] [phone] or if no answer before 7:00PM call [phone] For any issues after 7:00PM please call eLink [phone]

## 2023-08-12 NOTE — Telephone Encounter (Signed)
PT states they need the CT scan set up before the appt in November. She said someone called giving them a date after that.  His # is [phone]

## 2023-08-12 NOTE — Assessment & Plan Note (Signed)
Some calcified granulomas, right upper lobe noncalcified nodule with focal left basilar airway thickening and evidence of prior inflammatory change, bronchiectasis.  He does not have any bronchiectatic symptoms currently.  He was in [place], [place], [place], suspect this represents granulomatous disease.  The nodule will need to be followed based on size for 2 years.  We will perform a repeat CT chest in November at [place].

## 2023-08-14 DIAGNOSIS — N3000 Acute cystitis without hematuria: Secondary | ICD-10-CM | POA: Diagnosis not present

## 2023-08-16 NOTE — Progress Notes (Signed)
Remote ICD transmission.   

## 2023-08-22 IMAGING — CR DG CHEST 2V
2 series · 2 of 2 positions shown · non-contrast
Comparison: 12/15/2021

CLINICAL DATA: Shortness of breath

EXAM:
CHEST - 2 VIEW

[chest pa]
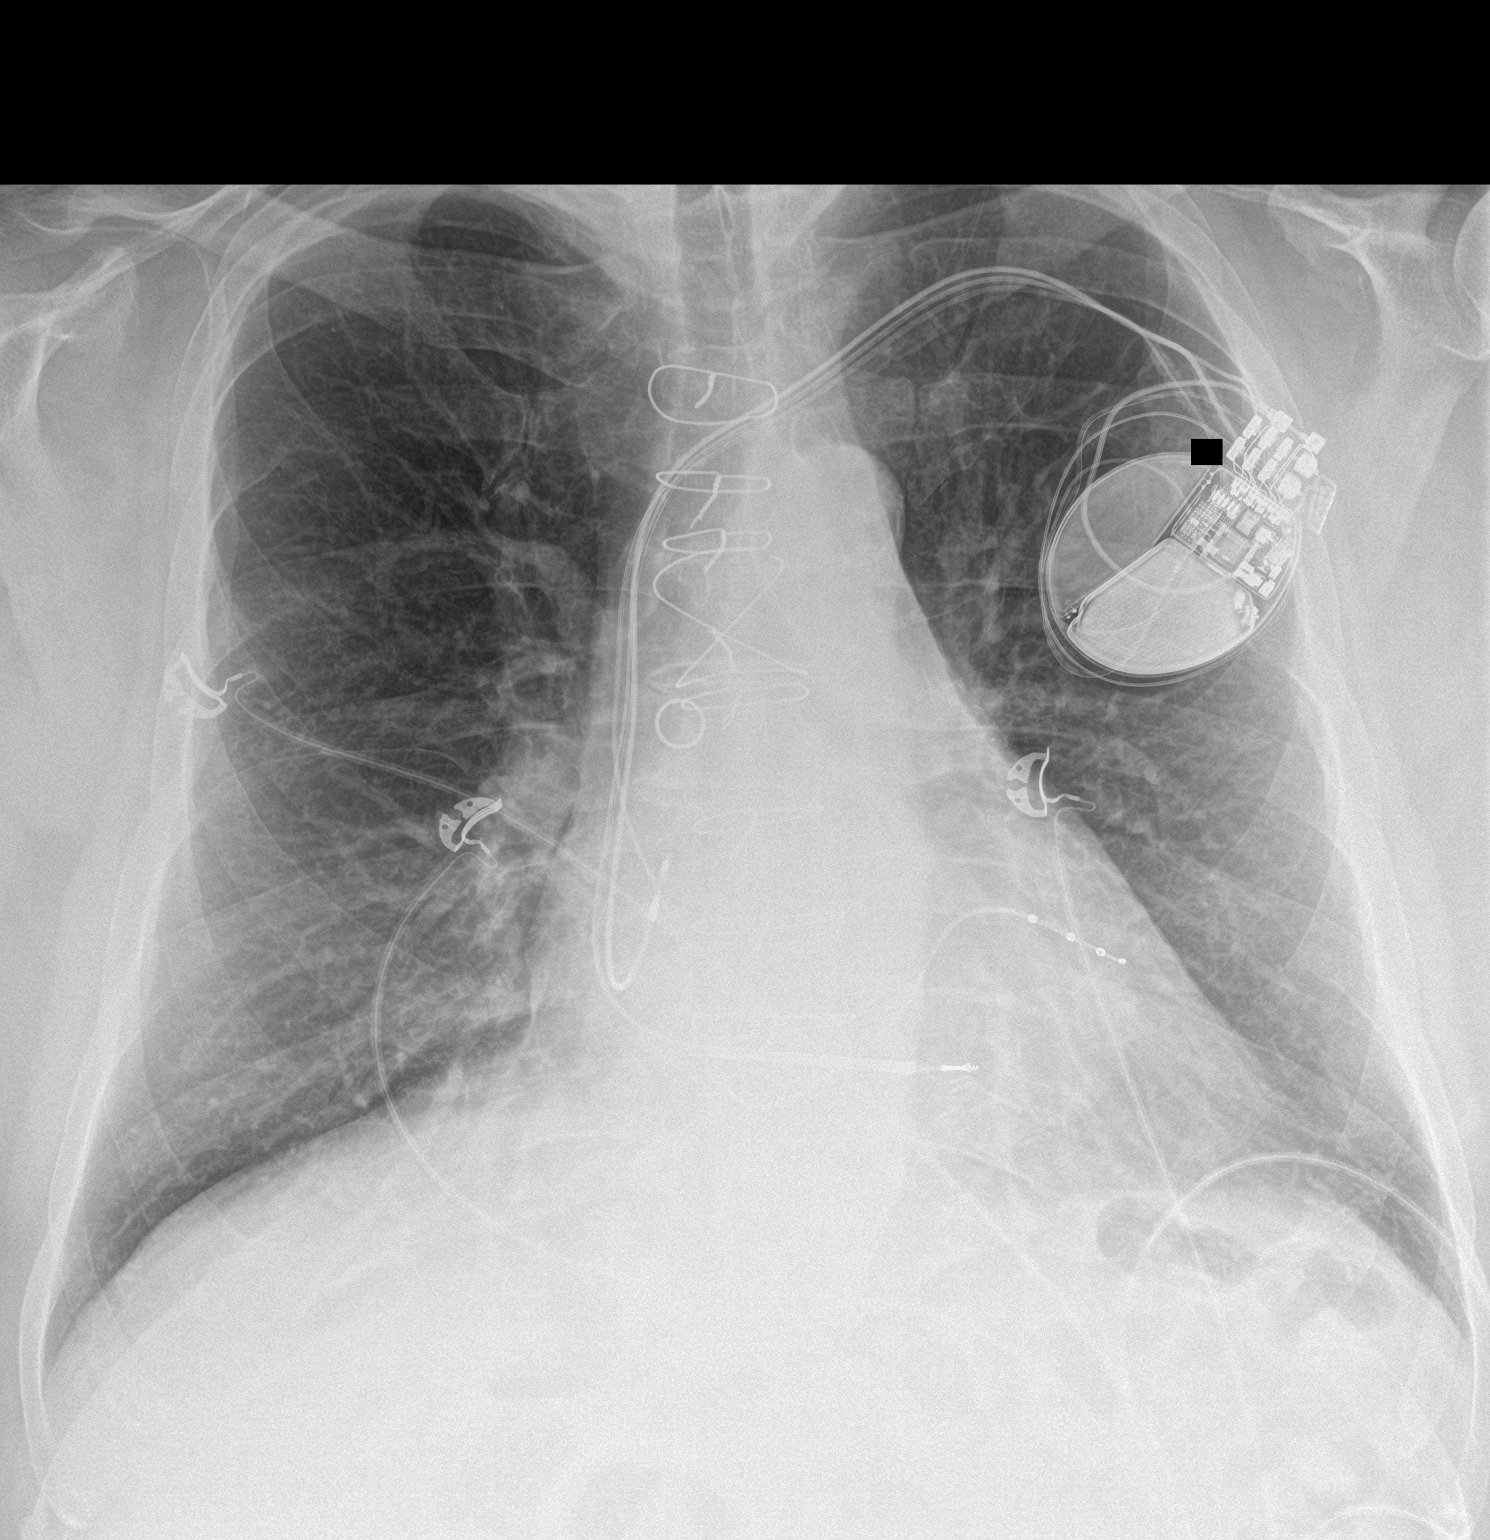

[chest lat]
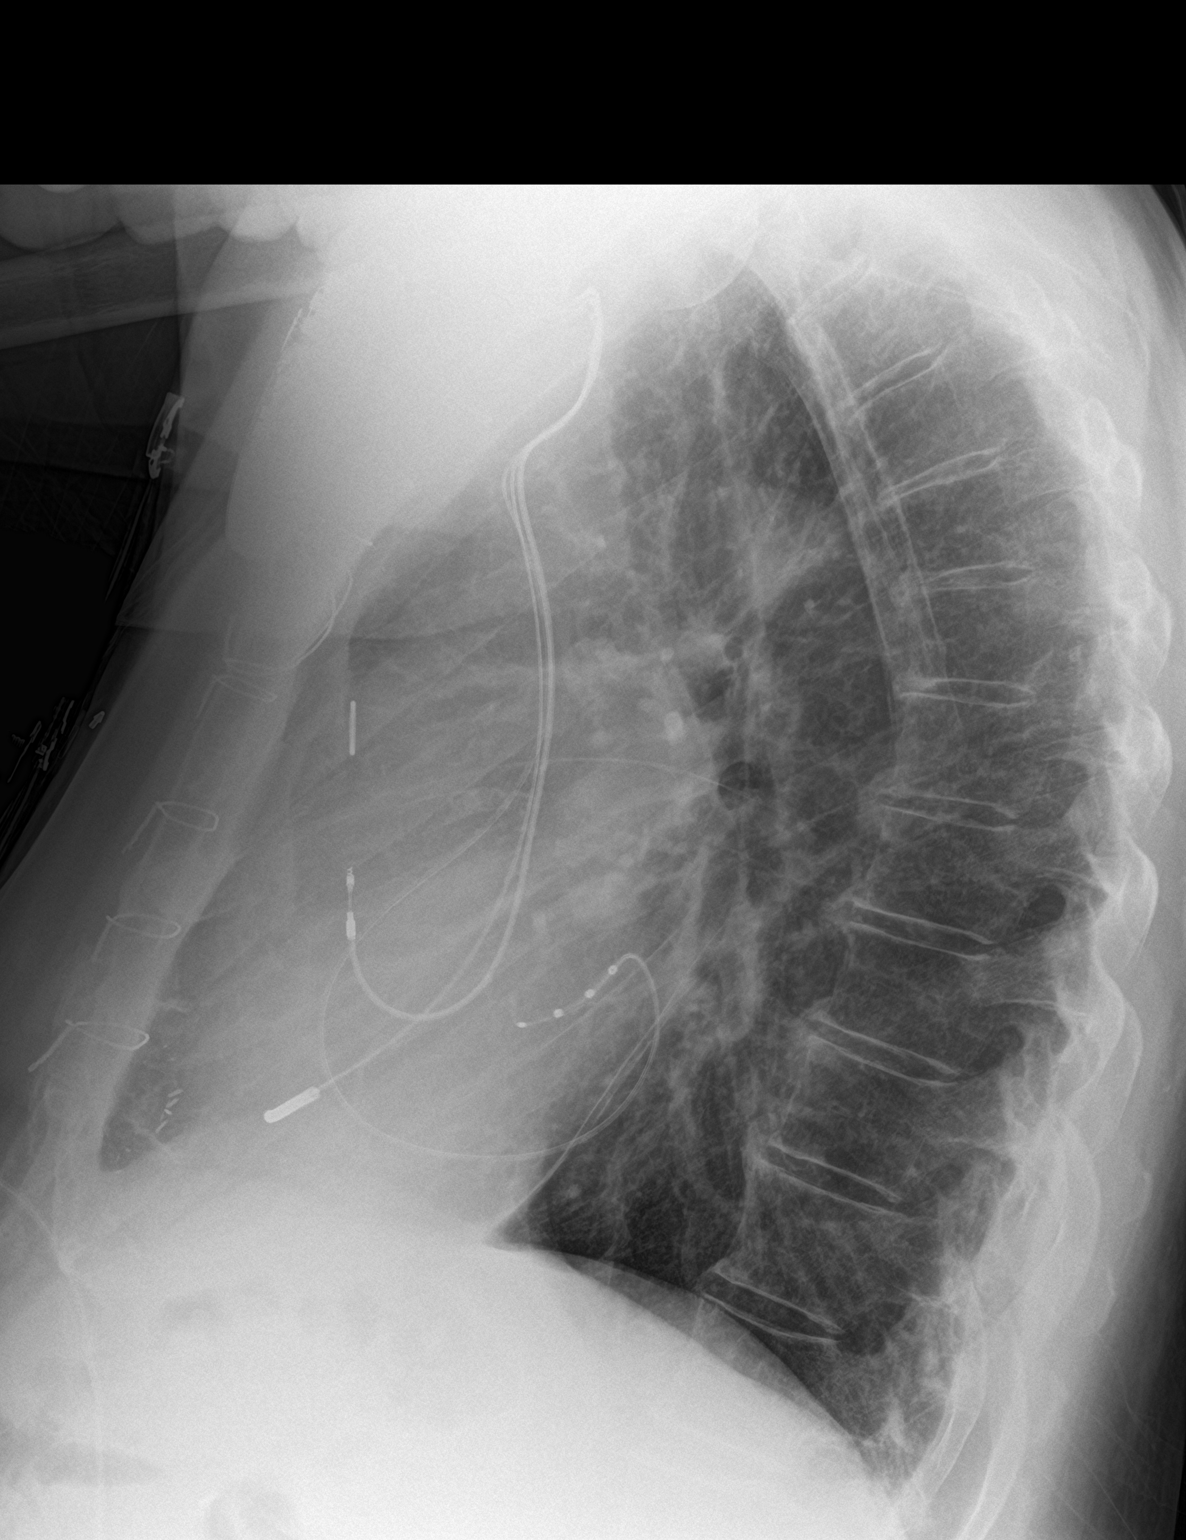

[2 of 2 positions shown; findings below may reference images not displayed]

FINDINGS: Left AICD remains in place, unchanged. Prior CABG. Heart is normal
size. Lungs clear. No effusions or acute bony abnormality.
IMPRESSION: No active cardiopulmonary disease.

## 2023-09-18 ENCOUNTER — Other Ambulatory Visit: Payer: Self-pay | Admitting: Physician Assistant

## 2023-09-21 DIAGNOSIS — N390 Urinary tract infection, site not specified: Secondary | ICD-10-CM | POA: Diagnosis not present

## 2023-09-21 DIAGNOSIS — R300 Dysuria: Secondary | ICD-10-CM | POA: Diagnosis not present

## 2023-09-23 DIAGNOSIS — I4729 Other ventricular tachycardia: Secondary | ICD-10-CM | POA: Diagnosis not present

## 2023-09-24 LAB — TSH: TSH: 3.17 u[IU]/mL (ref 0.450–4.500)

## 2023-09-26 ENCOUNTER — Encounter: Payer: Self-pay | Admitting: Internal Medicine

## 2023-09-27 ENCOUNTER — Other Ambulatory Visit: Payer: Self-pay | Admitting: Family Medicine

## 2023-09-27 ENCOUNTER — Other Ambulatory Visit: Payer: Self-pay

## 2023-09-27 NOTE — Telephone Encounter (Signed)
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

## 2023-09-27 NOTE — Telephone Encounter (Signed)
Please advise if OK to fill.

## 2023-09-27 NOTE — Telephone Encounter (Signed)
Pt is calling requesting a refill on furosemide. This medication has been refilled with pt's PCP. Would Dr. Rennis Golden like to refill this medication? Please address

## 2023-09-28 ENCOUNTER — Other Ambulatory Visit: Payer: Self-pay | Admitting: Family Medicine

## 2023-09-28 DIAGNOSIS — M9904 Segmental and somatic dysfunction of sacral region: Secondary | ICD-10-CM | POA: Diagnosis not present

## 2023-09-28 DIAGNOSIS — M9903 Segmental and somatic dysfunction of lumbar region: Secondary | ICD-10-CM | POA: Diagnosis not present

## 2023-09-28 DIAGNOSIS — M51361 Other intervertebral disc degeneration, lumbar region with lower extremity pain only: Secondary | ICD-10-CM | POA: Diagnosis not present

## 2023-09-28 DIAGNOSIS — M51371 Other intervertebral disc degeneration, lumbosacral region with lower extremity pain only: Secondary | ICD-10-CM | POA: Diagnosis not present

## 2023-09-28 DIAGNOSIS — M5441 Lumbago with sciatica, right side: Secondary | ICD-10-CM | POA: Diagnosis not present

## 2023-09-30 NOTE — Progress Notes (Unsigned)
Cardiology Office Note:   Date:  10/03/2023  ID:  Bryan Miller, DOB 27-Dec-1951, MRN [id number] PCP:  Blane Ohara, MD  [facility] Providers Cardiologist:  Alverda Skeans, MD Referring MD: Blane Ohara, MD   Chief Complaint/Reason for Referral: Cardiology follow-up ASSESSMENT:    1. Chronic heart failure with mildly reduced ejection fraction (HFmrEF, 41-49%) (HCC)   2. Hx of CABG   3. Hyperlipidemia LDL goal <70   4. Elevated Lp(a)   5. Primary hypertension   6. CKD (chronic kidney disease) stage 2, GFR 60-89 ml/min   7. Presence of cardiac defibrillator   8. Aortic atherosclerosis (HCC)     PLAN:   In order of problems listed above: 1.  Mildly reduced ejection fraction: Continue Jardiance, Toprol, spironolactone, and defer losartan 25 mg daily for now due to low BP. 2.  Coronary artery disease status post CABG: Continue aspirin, statin, Repatha, and Toprol. 3.  Hyperlipidemia: Check lipid panel and LFTs today. 4.  Elevated LP(a): Continue Repatha. 5.  Hypertension: Blood pressure is actually low today.  Will decrease Lasix to 20 mg and Toprol XL to 12.5 mg at bedtime. 6.  CKD stage II: Continue Jardiance; consider losartan in the future for renal protection if blood pressure is improved. 7.  Status post ICD: Followed by EP; continue ranolazine and amiodarone. 8.  Aortic atherosclerosis: Continue Repatha, statin, and aspirin.       Dispo:  Return in about 3 months (around 01/03/2024).      Medication Adjustments/Labs and Tests Ordered: Current medicines are reviewed at length with the patient today.  Concerns regarding medicines are outlined above.  The following changes have been made:     Labs/tests ordered: Orders Placed This Encounter  Procedures   Lipid panel   Hepatic function panel    Medication Changes: Meds ordered this encounter  Medications   metoprolol succinate (TOPROL XL) 25 MG 24 hr tablet    Sig: Take 0.5 tablets (12.5 mg total) by mouth at  bedtime.    Dispense:  45 tablet    Refill:  3    Dose change (decreased).   furosemide (LASIX) 20 MG tablet    Sig: Take 1 tablet (20 mg total) by mouth daily.    Dose change (decreased).    Current medicines are reviewed at length with the patient today.  The patient does not have concerns regarding medicines.  History of Present Illness:      FOCUSED PROBLEM LIST:   Coronary artery disease status post CABG 2003 consisting of a LIMA to LAD, and vein graft to RCA;  coronary angiography January 2023 demonstrated vein graft to RCA was occluded Ventricular tachycardia status post ICD  Ischemic cardiomyopathy with ejection fraction approximately 40% status post CRT-D; followed by EP Hypertension Hyperlipidemia; statin intolerant  Elevated LP(a) Hypothyroidism  CKD stage II Aortic atherosclerosis on CT abdomen pelvis 2024  February 2023: Patient seen for initial cardiology visit.  This was to establish general cardiovascular care after being hospitalized for ventricular tachycardia.  At that time Jardiance 10 mg daily was started, Imdur was stopped due to headaches, losartan 12.5 mg at bedtime was started.  He saw EP recently and due to mild volume overload he was pulsed dosed 40 mg of Lasix for 3 days and then changed to Lasix 40 mg every other day.  Recent device interrogation was reassuring.   May 2023: The patient is doing well.  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  He denies any significant shortness of breath, palpitations, paroxysmal nocturnal dyspnea, orthopnea.  He has had no exertional angina.  He denies any severe bleeding.  He does tire out near the end of the day at times.  Fortunately has not required any hospitalizations or emergency room visits.  He is compliant with his medications with no missed doses.  He is otherwise without complaints today.  Plan: Continue current therapy.  October  2024: In the interim he was seen by Dr. Rennis Golden and Repatha was started.  He was seen by Dr. Graciela Husbands and Plavix was stopped.  The patient has been doing well.  He and his wife were in the [employer] recently and they had a very good time.  He has noticed however that sometimes his blood pressures are low and he can feel little off.  He fortunately has not had any syncope or severe lightheadedness spells.  He denies any exertional angina.  His breathing is usually very good but on some days he will have a little bit more difficulty breathing.  This does not seem to be on days when his blood pressure is low.  This does not happen on a routine basis however.  The patient has remarkably lost close to 30 pounds from exercise and dietary modification.  He overall feels better.  He said no severe bleeding or bruising while on aspirin monotherapy.  He has had no signs or symptoms of stroke.  Overall he feels very well.          Current Medications: Current Meds  Medication Sig   acetaminophen (TYLENOL) 500 MG tablet Take 500 mg by mouth at bedtime.   albuterol (VENTOLIN HFA) 108 (90 Base) MCG/ACT inhaler Inhale into the lungs.   amiodarone (PACERONE) 200 MG tablet TAKE 1 TABLET(200 MG) BY MOUTH DAILY (Patient taking differently: TAKE 1 TABLET(200 MG) BY MOUTH MONDAY - FRIDAY)   aspirin EC 81 MG tablet Take 81 mg by mouth daily.   atorvastatin (LIPITOR) 40 MG tablet TAKE 1 TABLET(40 MG) BY MOUTH DAILY   Evolocumab (REPATHA SURECLICK) 140 MG/ML SOAJ Inject 140 mg into the skin every 14 (fourteen) days.   fluticasone (FLONASE) 50 MCG/ACT nasal spray Place 2 sprays into both nostrils daily. (Patient taking differently: Place 2 sprays into both nostrils as needed.)   furosemide (LASIX) 20 MG tablet Take 1 tablet (20 mg total) by mouth daily.   levothyroxine (SYNTHROID) 88 MCG tablet TAKE 1 TABLET(88 MCG) BY MOUTH DAILY BEFORE BREAKFAST   loratadine (CLARITIN) 10 MG tablet Take 10 mg by mouth daily as needed for  allergies.   metoprolol succinate (TOPROL XL) 25 MG 24 hr tablet Take 0.5 tablets (12.5 mg total) by mouth at bedtime.   nitroGLYCERIN (NITROSTAT) 0.4 MG SL tablet Place 1 tablet (0.4 mg total) under the tongue every 5 (five) minutes as needed for chest pain.   OVER THE COUNTER MEDICATION Place 1 spray into both nostrils as needed (nasal congestion). Xytiol   ranolazine (RANEXA) 500 MG 12 hr tablet TAKE 1 TABLET BY MOUTH TWICE DAILY   spironolactone (ALDACTONE) 25 MG tablet TAKE 1/2 TABLET BY MOUTH EVERY NIGHT AT BEDTIME   [DISCONTINUED] furosemide (LASIX) 40 MG tablet TAKE 1 TABLET(40 MG) BY MOUTH DAILY   [DISCONTINUED] metoprolol succinate (TOPROL-XL) 25 MG 24 hr tablet TAKE 1 TABLET(25 MG) BY MOUTH AT BEDTIME      Review of Systems:   Please see the  history of present illness.    All other systems reviewed and are negative.      EKGs/Labs/Other Test Reviewed:   EKG: EKG performed August 2024 demonstrates atrial sensing and ventricular pacing as well as AV sequential paced rhythms  EKG Interpretation Date/Time:    Ventricular Rate:    PR Interval:    QRS Duration:    QT Interval:    QTC Calculation:   R Axis:      Text Interpretation:           Risk Assessment/Calculations:          Physical Exam:   VS:  BP (!) 86/58   Pulse 74   Ht 6\' 3"  (1.905 m)   Wt 241 lb 12.8 oz (109.7 kg)   SpO2 96%   BMI 30.22 kg/m        Wt Readings from Last 3 Encounters:  10/03/23 241 lb 12.8 oz (109.7 kg)  08/12/23 242 lb 9.6 oz (110 kg)  08/11/23 244 lb 9.6 oz (110.9 kg)      GENERAL:  No apparent distress, AOx3 HEENT:  No carotid bruits, +2 carotid impulses, no scleral icterus CAR: RRR no murmurs, gallops, rubs, or thrills RES:  Clear to auscultation bilaterally ABD:  Soft, nontender, nondistended, positive bowel sounds x 4 VASC:  +2 radial pulses, +2 carotid pulses NEURO:  CN 2-12 grossly intact; motor and sensory grossly intact PSYCH:  No active depression or  anxiety EXT:  No edema, ecchymosis, or cyanosis  Signed, Orbie Pyo, MD  10/03/2023 3:55 PM    [facility] [address] [place], [place], [place]  [address] Phone: [phone]; Fax: [phone]   Note:  This document was prepared using Dragon voice recognition software and may include unintentional dictation errors.

## 2023-10-03 ENCOUNTER — Encounter: Payer: Self-pay | Admitting: Internal Medicine

## 2023-10-03 ENCOUNTER — Ambulatory Visit: Payer: Medicare HMO | Attending: Internal Medicine | Admitting: Internal Medicine

## 2023-10-03 VITALS — BP 86/58 | HR 74 | Ht 75.0 in | Wt 241.8 lb

## 2023-10-03 DIAGNOSIS — N182 Chronic kidney disease, stage 2 (mild): Secondary | ICD-10-CM | POA: Diagnosis not present

## 2023-10-03 DIAGNOSIS — I10 Essential (primary) hypertension: Secondary | ICD-10-CM | POA: Diagnosis not present

## 2023-10-03 DIAGNOSIS — Z95810 Presence of automatic (implantable) cardiac defibrillator: Secondary | ICD-10-CM

## 2023-10-03 DIAGNOSIS — I5022 Chronic systolic (congestive) heart failure: Secondary | ICD-10-CM | POA: Diagnosis not present

## 2023-10-03 DIAGNOSIS — E785 Hyperlipidemia, unspecified: Secondary | ICD-10-CM | POA: Diagnosis not present

## 2023-10-03 DIAGNOSIS — Z951 Presence of aortocoronary bypass graft: Secondary | ICD-10-CM | POA: Diagnosis not present

## 2023-10-03 DIAGNOSIS — I700 Atherosclerosis of aorta: Secondary | ICD-10-CM

## 2023-10-03 DIAGNOSIS — E7841 Elevated Lipoprotein(a): Secondary | ICD-10-CM

## 2023-10-03 MED ORDER — FUROSEMIDE 20 MG PO TABS: 20.0000 mg | ORAL_TABLET | Freq: Every day | ORAL | Status: DC

## 2023-10-03 MED ORDER — METOPROLOL SUCCINATE ER 25 MG PO TB24: 12.5000 mg | ORAL_TABLET | Freq: Every day | ORAL | 3 refills | Status: DC

## 2023-10-03 NOTE — Patient Instructions (Addendum)
Medication Instructions:  Your physician has recommended you make the following change in your medication:   1) DECREASE furosemide (Lasix) to 20 mg daily 2) DECREASE metoprolol succinate (Toprol XL) to 12.5 mg daily at bedtime  *If you need a refill on your cardiac medications before your next appointment, please call your pharmacy*  Lab Work: TODAY: Lipid panel, LFTs If you have labs (blood work) drawn today and your tests are completely normal, you will receive your results only by: MyChart Message (if you have MyChart) OR A paper copy in the mail If you have any lab test that is abnormal or we need to change your treatment, we will call you to review the results.  Testing/Procedures: None ordered today.  Follow-Up: At [facility], you and your health needs are our priority.  As part of our continuing mission to provide you with exceptional heart care, we have created designated Provider Care Teams.  These Care Teams include your primary Cardiologist (physician) and Advanced Practice Providers (APPs -  Physician Assistants and Nurse Practitioners) who all work together to provide you with the care you need, when you need it.  Your next appointment:   3 month(s)  The format for your next appointment:   In Person  Provider:   Jari Favre, PA-C, Ronie Spies, PA-C, Robin Searing, NP, Jacolyn Reedy, PA-C, Eligha Bridegroom, NP, Tereso Newcomer, PA-C, or Perlie Gold, PA-C

## 2023-10-04 LAB — HEPATIC FUNCTION PANEL
ALT: 17 [IU]/L (ref 0–44)
AST: 22 [IU]/L (ref 0–40)
Albumin: 4 g/dL (ref 3.8–4.8)
Alkaline Phosphatase: 79 [IU]/L (ref 44–121)
Bilirubin Total: 0.8 mg/dL (ref 0.0–1.2)
Bilirubin, Direct: 0.25 mg/dL (ref 0.00–0.40)
Total Protein: 6.4 g/dL (ref 6.0–8.5)

## 2023-10-04 LAB — LIPID PANEL
Chol/HDL Ratio: 2.6 {ratio} (ref 0.0–5.0)
Cholesterol, Total: 113 mg/dL (ref 100–199)
HDL: 44 mg/dL (ref >39)
LDL Chol Calc (NIH): 35 mg/dL (ref 0–99)
Triglycerides: 216 mg/dL — ABNORMAL HIGH (ref 0–149)
VLDL Cholesterol Cal: 34 mg/dL (ref 5–40)

## 2023-10-24 ENCOUNTER — Other Ambulatory Visit: Payer: Self-pay | Admitting: Family Medicine

## 2023-10-27 DIAGNOSIS — R339 Retention of urine, unspecified: Secondary | ICD-10-CM | POA: Diagnosis not present

## 2023-10-27 DIAGNOSIS — Z125 Encounter for screening for malignant neoplasm of prostate: Secondary | ICD-10-CM | POA: Diagnosis not present

## 2023-10-27 DIAGNOSIS — N390 Urinary tract infection, site not specified: Secondary | ICD-10-CM | POA: Diagnosis not present

## 2023-10-27 DIAGNOSIS — N401 Enlarged prostate with lower urinary tract symptoms: Secondary | ICD-10-CM | POA: Diagnosis not present

## 2023-10-27 DIAGNOSIS — Z79899 Other long term (current) drug therapy: Secondary | ICD-10-CM | POA: Diagnosis not present

## 2023-11-01 ENCOUNTER — Other Ambulatory Visit: Payer: Self-pay | Admitting: Nurse Practitioner

## 2023-11-01 DIAGNOSIS — N390 Urinary tract infection, site not specified: Secondary | ICD-10-CM

## 2023-11-01 DIAGNOSIS — R339 Retention of urine, unspecified: Secondary | ICD-10-CM

## 2023-11-02 ENCOUNTER — Telehealth: Payer: Self-pay | Admitting: Emergency Medicine

## 2023-11-02 NOTE — Telephone Encounter (Signed)
[facility] calling needing an order for the CT sched for tomorrow. If not rec'd by days end will cancel. Sent req to [employer] thru Teams w/CB # and fax.

## 2023-11-02 NOTE — Telephone Encounter (Signed)
Hey looks like you worked on this one dont forget to send the [place] with the fax.

## 2023-11-02 NOTE — Telephone Encounter (Signed)
[facility] calling needing an order for the CT sched for tomorrow. If not rec'd by days end will cancel.

## 2023-11-03 ENCOUNTER — Telehealth: Payer: Self-pay | Admitting: Emergency Medicine

## 2023-11-03 DIAGNOSIS — R9389 Abnormal findings on diagnostic imaging of other specified body structures: Secondary | ICD-10-CM | POA: Diagnosis not present

## 2023-11-03 NOTE — Telephone Encounter (Signed)
[facility] hospital  have the prior auth but need the referral for CT.  Fax [phone]

## 2023-11-07 ENCOUNTER — Other Ambulatory Visit (HOSPITAL_COMMUNITY): Payer: Self-pay

## 2023-11-10 ENCOUNTER — Ambulatory Visit: Payer: Medicare HMO | Admitting: Emergency Medicine

## 2023-11-10 ENCOUNTER — Encounter: Payer: Self-pay | Admitting: Emergency Medicine

## 2023-11-10 VITALS — BP 118/73 | HR 74 | Temp 97.8°F | Ht 75.0 in | Wt 254.4 lb

## 2023-11-10 DIAGNOSIS — R9389 Abnormal findings on diagnostic imaging of other specified body structures: Secondary | ICD-10-CM

## 2023-11-10 NOTE — Progress Notes (Signed)
Subjective:    Patient ID: Bryan Miller, male    DOB: 11-13-1951, 72 y.o.   MRN: [id number]  HPI  72 year old former smoker (1-2 pack years) with a history of CAD/CABG, chronic systolic CHF with VT and a defibrillator, hyperlipidemia, hypothyroidism.  He is referred today for an abnormal chest imaging.  A CT chest and trauma series was done 05/08/2023 [facility].  I have the report available but not the images.  Mediastinum showed subcentimeter mediastinal nodes.  There was a 4 x 9 mm solid nodule in the posterior right upper lobe abutting the fissure, moderate central and lower lobe traction bronchiectasis and diffuse airway thickening with some associated mucous plugging, tree-in-bud.  Calcified granuloma in the superior segment of the left lower lobe. Occasional SOB w exertion, no cough.   ROV 11/10/2023 --follow-up visit 72 year old man with a minimal tobacco history, CAD/CABG, systolic CHF with VT and a defibrillator.  I saw him in August for abnormal CT chest done in May.  He had some scattered calcified granulomas and a right upper lobe noncalcified nodule with some focal left basilar airway thickening and bronchiectatic change.  Consistent with granulomatous disease.  CT scan of the chest done at [facility] on 11/03/23.   Review of Systems As per HPi  Past Medical History:  Diagnosis Date   CAD (coronary artery disease)    Chronic kidney disease, stage 3a (HCC)    Chronic systolic CHF (congestive heart failure) (HCC)    Dilated aortic root (HCC) 12/05/2018   Hyperlipidemia 08/15/2020   Hypothyroidism    Left bundle branch block 09/14/2019   Old myocardial infarction 06/26/2011   Prediabetes 05/05/2010   Presence of cardiac defibrillator    S/P CABG (coronary artery bypass graft)    Ventricular tachycardia, monomorphic (HCC)      Family History  Problem Relation Age of Onset   Dementia Mother    Heart Problems Mother    Heart Problems Father      Social History    Socioeconomic History   Marital status: Married    Spouse name: Darl Pikes   Number of children: 3   Years of education: Not on file   Highest education level: Not on file  Occupational History   Not on file  Tobacco Use   Smoking status: Former    Current packs/day: 0.00    Types: Cigarettes    Quit date: 1970    Years since quitting: 54.9   Smokeless tobacco: Never  Vaping Use   Vaping status: Never Used  Substance and Sexual Activity   Alcohol use: Not Currently   Drug use: Never   Sexual activity: Yes    Partners: Female  Other Topics Concern   Not on file  Social History Narrative   Retired - currently Armed forces operational officer at [employer].  Lives with wife, moved back to [place] from [place] a few if years ago.  Has one son and two daughters.   Social Determinants of Health   Financial Resource Strain: Low Risk  (08/04/2023)   Overall Financial Resource Strain (CARDIA)    Difficulty of Paying Living Expenses: Not hard at all  Food Insecurity: No Food Insecurity (08/09/2023)   Hunger Vital Sign    Worried About Running Out of Food in the Last Year: Never true    Ran Out of Food in the Last Year: Never true  Transportation Needs: No Transportation Needs (08/09/2023)   PRAPARE - Administrator, Civil Service (Medical): No  Lack of Transportation (Non-Medical): No  Physical Activity: Sufficiently Active (08/09/2023)   Exercise Vital Sign    Days of Exercise per Week: 7 days    Minutes of Exercise per Session: 60 min  Stress: No Stress Concern Present (08/09/2023)   [employer] of Occupational Health - Occupational Stress Questionnaire    Feeling of Stress : Not at all  Social Connections: Moderately Integrated (08/04/2023)   Social Connection and Isolation Panel [NHANES]    Frequency of Communication with Friends and Family: More than three times a week    Frequency of Social Gatherings with Friends and Family: More than three times a week    Attends Religious Services:  More than 4 times per year    Active Member of [employer] or Organizations: No    Attends Banker Meetings: Never    Marital Status: Married  Catering manager Violence: Not At Risk (08/09/2023)   Humiliation, Afraid, Rape, and Kick questionnaire    Fear of Current or Ex-Partner: No    Emotionally Abused: No    Physically Abused: No    Sexually Abused: No    Has lived in [place], [place], [place], [place] Teaches school, 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     albuterol (VENTOLIN HFA) 108 (90 Base) MCG/ACT inhaler Inhale into the lungs.     amiodarone (PACERONE) 200 MG tablet TAKE 1 TABLET(200 MG) BY MOUTH DAILY (Patient taking differently: TAKE 1 TABLET(200 MG) BY MOUTH MONDAY - FRIDAY) 90 tablet 3   aspirin EC 81 MG tablet Take 81 mg by mouth daily.     atorvastatin (LIPITOR) 40 MG tablet TAKE 1 TABLET(40 MG) BY MOUTH DAILY 90 tablet 1   fluticasone (FLONASE) 50 MCG/ACT nasal spray Place 2 sprays into both nostrils daily. (Patient taking differently: Place 2 sprays into both nostrils as needed.) 16 g 6   furosemide (LASIX) 20 MG tablet Take 1 tablet (20 mg total) by mouth daily.     levothyroxine (SYNTHROID) 88 MCG tablet TAKE 1 TABLET(88 MCG) BY MOUTH DAILY BEFORE BREAKFAST 30 tablet 6   loratadine (CLARITIN) 10 MG tablet Take 10 mg by mouth daily as needed for allergies.     metoprolol succinate (TOPROL XL) 25 MG 24 hr tablet Take 0.5 tablets (12.5 mg total) by mouth at bedtime. 45 tablet 3   nitroGLYCERIN (NITROSTAT) 0.4 MG SL tablet Place 1 tablet (0.4 mg total) under the tongue every 5 (five) minutes as needed for chest pain. 25 tablet 0   OVER THE COUNTER MEDICATION Place 1 spray into both nostrils as needed (nasal congestion). Xytiol     ranolazine (RANEXA) 500 MG 12 hr tablet TAKE 1 TABLET BY MOUTH  TWICE DAILY 180 tablet 1   spironolactone (ALDACTONE) 25 MG tablet TAKE 1/2 TABLET BY MOUTH EVERY NIGHT AT BEDTIME 90 tablet 0   empagliflozin (JARDIANCE) 25 MG TABS tablet Take 1 tablet (25 mg total) by mouth daily before breakfast. (Patient not taking: Reported on 10/03/2023) 90 tablet 3   Evolocumab (REPATHA SURECLICK) 140 MG/ML SOAJ Inject 140 mg into the skin every 14 (fourteen) days. (Patient not taking: Reported on 11/10/2023) 6 mL 3   No facility-administered medications prior to visit.         Objective:   Physical Exam Vitals:  11/10/23 1357  BP: 118/73  Pulse: 74  Temp: 97.8 F (36.6 C)  TempSrc: Oral  SpO2: 98%  Weight: 254 lb 6.4 oz (115.4 kg)  Height: 6\' 3"  (1.905 m)    Gen: Pleasant, well-nourished, in no distress,  normal affect  ENT: No lesions,  mouth clear,  oropharynx clear, no postnasal drip  Neck: No JVD, no stridor  Lungs: No use of accessory muscles, no crackles or wheezing on normal respiration, no wheeze on forced expiration  Cardiovascular: RRR, heart sounds normal, no murmur or gallops, no peripheral edema  Musculoskeletal: No deformities, no cyanosis or clubbing  Neuro: alert, awake, non focal  Skin: Warm, no lesions or rash       Assessment & Plan:  Abnormal CT of the chest Scattered pulmonary nodular disease and some associated bronchiectasis.  He is not having clinical symptoms.  His CT scan of the chest 11/03/2023 showed nodules stable in distribution in size.  Plan to repeat in 10/2024.  Ultimately plan for 2 years of stability    Levy Pupa, MD, PhD 11/10/2023, 2:25 PM [facility] [phone] or if no answer before 7:00PM call [phone] For any issues after 7:00PM please call eLink [phone]

## 2023-11-10 NOTE — Patient Instructions (Signed)
We reviewed your CT scan of the chest today. We will repeat your CT chest in November 2025 to compare with priors. Please follow Dr. Delton Coombes in November after your CT so we can review.  Call sooner if you have any new respiratory issues or symptoms.

## 2023-11-10 NOTE — Assessment & Plan Note (Signed)
Scattered pulmonary nodular disease and some associated bronchiectasis.  He is not having clinical symptoms.  His CT scan of the chest 11/03/2023 showed nodules stable in distribution in size.  Plan to repeat in 10/2024.  Ultimately plan for 2 years of stability

## 2023-11-14 ENCOUNTER — Encounter: Payer: Self-pay | Admitting: Nurse Practitioner

## 2023-11-15 ENCOUNTER — Telehealth: Payer: Self-pay | Admitting: Pharmacy Technician

## 2023-11-15 ENCOUNTER — Other Ambulatory Visit (HOSPITAL_COMMUNITY): Payer: Self-pay

## 2023-11-15 NOTE — Telephone Encounter (Signed)
Pharmacy Patient Advocate Encounter   Received notification from CoverMyMeds that prior authorization for repatha is required/requested.   Insurance verification completed.   The patient is insured through [place] .   Per test claim: The current 11/15/23 day co-pay is, $132.03- one month- [facility]).  No PA needed at this time. This test claim was processed through [facility]- copay amounts may vary at other pharmacies due to pharmacy/plan contracts, or as the patient moves through the different stages of their insurance plan.

## 2023-11-21 ENCOUNTER — Ambulatory Visit
Admission: RE | Admit: 2023-11-21 | Discharge: 2023-11-21 | Disposition: A | Discharge status: Home / Self Care | Payer: Medicare HMO | Source: Ambulatory Visit | Attending: Nurse Practitioner | Admitting: Nurse Practitioner

## 2023-11-21 DIAGNOSIS — N390 Urinary tract infection, site not specified: Secondary | ICD-10-CM | POA: Diagnosis not present

## 2023-11-21 DIAGNOSIS — I6529 Occlusion and stenosis of unspecified carotid artery: Secondary | ICD-10-CM | POA: Diagnosis not present

## 2023-11-21 DIAGNOSIS — R339 Retention of urine, unspecified: Secondary | ICD-10-CM

## 2023-11-21 DIAGNOSIS — N1831 Chronic kidney disease, stage 3a: Secondary | ICD-10-CM | POA: Diagnosis not present

## 2023-11-21 MED ORDER — IOPAMIDOL (ISOVUE-370) INJECTION 76%
80.0000 mL | Freq: Once | INTRAVENOUS | Status: AC | PRN
  Administered 2023-11-21: 80 mL via INTRAVENOUS

## 2023-12-08 DIAGNOSIS — N390 Urinary tract infection, site not specified: Secondary | ICD-10-CM | POA: Diagnosis not present

## 2023-12-08 DIAGNOSIS — N401 Enlarged prostate with lower urinary tract symptoms: Secondary | ICD-10-CM | POA: Diagnosis not present

## 2023-12-08 DIAGNOSIS — R339 Retention of urine, unspecified: Secondary | ICD-10-CM | POA: Diagnosis not present

## 2023-12-15 ENCOUNTER — Other Ambulatory Visit: Payer: Self-pay

## 2023-12-15 DIAGNOSIS — E038 Other specified hypothyroidism: Secondary | ICD-10-CM

## 2023-12-15 MED ORDER — LEVOTHYROXINE SODIUM 88 MCG PO TABS: 88.0000 ug | ORAL_TABLET | Freq: Every day | ORAL | 6 refills | Status: DC

## 2023-12-16 ENCOUNTER — Other Ambulatory Visit: Payer: Self-pay

## 2023-12-22 DIAGNOSIS — J984 Other disorders of lung: Secondary | ICD-10-CM | POA: Diagnosis not present

## 2023-12-22 DIAGNOSIS — R0981 Nasal congestion: Secondary | ICD-10-CM | POA: Diagnosis not present

## 2023-12-22 DIAGNOSIS — R051 Acute cough: Secondary | ICD-10-CM | POA: Diagnosis not present

## 2023-12-23 ENCOUNTER — Ambulatory Visit: Payer: Medicare HMO | Attending: Cardiology | Admitting: Cardiology

## 2023-12-23 ENCOUNTER — Encounter: Payer: Self-pay | Admitting: Cardiology

## 2023-12-23 VITALS — BP 106/62 | HR 74 | Ht 75.0 in | Wt 255.0 lb

## 2023-12-23 DIAGNOSIS — N182 Chronic kidney disease, stage 2 (mild): Secondary | ICD-10-CM | POA: Diagnosis not present

## 2023-12-23 DIAGNOSIS — I700 Atherosclerosis of aorta: Secondary | ICD-10-CM

## 2023-12-23 DIAGNOSIS — Z95810 Presence of automatic (implantable) cardiac defibrillator: Secondary | ICD-10-CM | POA: Diagnosis not present

## 2023-12-23 DIAGNOSIS — R300 Dysuria: Secondary | ICD-10-CM | POA: Insufficient documentation

## 2023-12-23 DIAGNOSIS — N390 Urinary tract infection, site not specified: Secondary | ICD-10-CM | POA: Insufficient documentation

## 2023-12-23 DIAGNOSIS — I4891 Unspecified atrial fibrillation: Secondary | ICD-10-CM | POA: Insufficient documentation

## 2023-12-23 DIAGNOSIS — I472 Ventricular tachycardia, unspecified: Secondary | ICD-10-CM

## 2023-12-23 DIAGNOSIS — I5022 Chronic systolic (congestive) heart failure: Secondary | ICD-10-CM | POA: Diagnosis not present

## 2023-12-23 DIAGNOSIS — Z951 Presence of aortocoronary bypass graft: Secondary | ICD-10-CM

## 2023-12-23 DIAGNOSIS — E785 Hyperlipidemia, unspecified: Secondary | ICD-10-CM

## 2023-12-23 DIAGNOSIS — I10 Essential (primary) hypertension: Secondary | ICD-10-CM | POA: Diagnosis not present

## 2023-12-23 DIAGNOSIS — N401 Enlarged prostate with lower urinary tract symptoms: Secondary | ICD-10-CM | POA: Insufficient documentation

## 2023-12-23 NOTE — Progress Notes (Unsigned)
  Cardiology Office Note:   Date:  12/23/2023  ID:  Bryan Miller, DOB [birth year], MRN [id number] PCP: Blane Ohara, MD  [facility] Providers Cardiologist:  None { Click to update primary MD,subspecialty MD or APP then REFRESH:1}   History of Present Illness:   Bryan Miller is a 72 y.o. male ***  Discussed the use of AI scribe software for clinical note transcription with the patient, who gave verbal consent to proceed.  History of Present Illness             Today patient denies chest pain, shortness of breath, lower extremity edema, fatigue, palpitations, melena, hematuria, hemoptysis, diaphoresis, weakness, presyncope, syncope, orthopnea, and PND.   Studies Reviewed:    EKG:        ***  Risk Assessment/Calculations:              Physical Exam:   VS:  BP 106/62   Pulse 74   Ht 6\' 3"  (1.905 m)   Wt 255 lb (115.7 kg)   SpO2 95%   BMI 31.87 kg/m    Wt Readings from Last 3 Encounters:  12/23/23 255 lb (115.7 kg)  11/10/23 254 lb 6.4 oz (115.4 kg)  10/03/23 241 lb 12.8 oz (109.7 kg)     Physical Exam  Physical Exam           ASSESSMENT AND PLAN:     Assessment and Plan                  {Are you ordering a CV Procedure (e.g. stress test, cath, DCCV, TEE, etc)?   Press F2        :[id number]}   Signed, Perlie Gold, PA-C

## 2023-12-23 NOTE — Patient Instructions (Signed)
Medication Instructions:  Your physician recommends that you continue on your current medications as directed. Please refer to the Current Medication list given to you today.  *If you need a refill on your cardiac medications before your next appointment, please call your pharmacy*   Lab Work: None ordere d  If you have labs (blood work) drawn today and your tests are completely normal, you will receive your results only by: MyChart Message (if you have MyChart) OR A paper copy in the mail If you have any lab test that is abnormal or we need to change your treatment, we will call you to review the results.   Testing/Procedures: None ordered    Follow-Up: At [facility], you and your health needs are our priority.  As part of our continuing mission to provide you with exceptional heart care, we have created designated Provider Care Teams.  These Care Teams include your primary Cardiologist (physician) and Advanced Practice Providers (APPs -  Physician Assistants and Nurse Practitioners) who all work together to provide you with the care you need, when you need it.  We recommend signing up for the patient portal called "MyChart".  Sign up information is provided on this After Visit Summary.  MyChart is used to connect with patients for Virtual Visits (Telemedicine).  Patients are able to view lab/test results, encounter notes, upcoming appointments, etc.  Non-urgent messages can be sent to your provider as well.   To learn more about what you can do with MyChart, go to [email or web address].    Your next appointment:   6 month(s)  Provider:   DR. Alverda Skeans   Other Instructions

## 2023-12-25 DIAGNOSIS — R9389 Abnormal findings on diagnostic imaging of other specified body structures: Secondary | ICD-10-CM | POA: Diagnosis not present

## 2023-12-26 ENCOUNTER — Other Ambulatory Visit: Payer: Self-pay | Admitting: Family Medicine

## 2023-12-26 ENCOUNTER — Telehealth: Payer: Self-pay | Admitting: Emergency Medicine

## 2023-12-26 NOTE — Telephone Encounter (Signed)
Lyla Son states CT scan is ready for review. Lyla Son phone number is [phone].

## 2023-12-26 NOTE — Telephone Encounter (Signed)
Thank you, will review. Plan is to repeat CT chest in 10/2024

## 2023-12-28 ENCOUNTER — Telehealth: Payer: Medicare HMO | Admitting: Physician Assistant

## 2023-12-28 DIAGNOSIS — J019 Acute sinusitis, unspecified: Secondary | ICD-10-CM | POA: Diagnosis not present

## 2023-12-28 DIAGNOSIS — B9689 Other specified bacterial agents as the cause of diseases classified elsewhere: Secondary | ICD-10-CM

## 2023-12-28 MED ORDER — AMOXICILLIN-POT CLAVULANATE 875-125 MG PO TABS: 1.0000 | ORAL_TABLET | Freq: Two times a day (BID) | ORAL | 0 refills | Status: DC

## 2023-12-28 MED ORDER — IPRATROPIUM BROMIDE 0.03 % NA SOLN: 2.0000 | Freq: Two times a day (BID) | NASAL | 0 refills | Status: AC

## 2023-12-28 MED ORDER — ALBUTEROL SULFATE HFA 108 (90 BASE) MCG/ACT IN AERS: 1.0000 | INHALATION_SPRAY | Freq: Four times a day (QID) | RESPIRATORY_TRACT | 0 refills | Status: AC | PRN

## 2023-12-28 MED ORDER — BENZONATATE 100 MG PO CAPS: 100.0000 mg | ORAL_CAPSULE | Freq: Three times a day (TID) | ORAL | 0 refills | Status: DC | PRN

## 2023-12-28 NOTE — Progress Notes (Signed)
 Virtual Visit Consent   Bryan Miller, you are scheduled for a virtual visit with a [facility] provider today. Just as with appointments in the office, your consent must be obtained to participate. Your consent will be active for this visit and any virtual visit you may have with one of our providers in the next 365 days. If you have a MyChart account, a copy of this consent can be sent to you electronically.  As this is a virtual visit, video technology does not allow for your provider to perform a traditional examination. This may limit your provider's ability to fully assess your condition. If your provider identifies any concerns that need to be evaluated in person or the need to arrange testing (such as labs, EKG, etc.), we will make arrangements to do so. Although advances in technology are sophisticated, we cannot ensure that it will always work on either your end or our end. If the connection with a video visit is poor, the visit may have to be switched to a telephone visit. With either a video or telephone visit, we are not always able to ensure that we have a secure connection.  By engaging in this virtual visit, you consent to the provision of healthcare and authorize for your insurance to be billed (if applicable) for the services provided during this visit. Depending on your insurance coverage, you may receive a charge related to this service.  I need to obtain your verbal consent now. Are you willing to proceed with your visit today? Bryan Miller has provided verbal consent on 12/28/2023 for a virtual visit (video or telephone). Delon CHRISTELLA Dickinson, PA-C  Date: 12/28/2023 1:57 PM  Virtual Visit via Video Note   I, Delon CHRISTELLA Dickinson, connected with  Bryan Miller  ([id number], Aug 01, 1951) on 12/28/23 at  1:45 PM EST by a video-enabled telemedicine application and verified that I am speaking with the correct person using two identifiers.  Location: Patient: Virtual Visit  Location Patient: Home Provider: Virtual Visit Location Provider: Home Office   I discussed the limitations of evaluation and management by telemedicine and the availability of in person appointments. The patient expressed understanding and agreed to proceed.    History of Present Illness: Bryan Miller is a 73 y.o. who identifies as a male who was assigned male at birth, and is being seen today for cough and congestion.  HPI: Cough This is a new problem. The current episode started 1 to 4 weeks ago (Started 12/16/23; Was seen at [facility] on 12/21/23 and was given Steroid IM, cough syrup; Using Albuterol  inhaler that was given last year). The problem has been gradually worsening. The problem occurs constantly. The cough is Productive of sputum and productive of purulent sputum. Associated symptoms include chest pain (tightness), chills, nasal congestion, postnasal drip, rhinorrhea, a sore throat (intermittent), shortness of breath (mild) and wheezing. Pertinent negatives include no fever, myalgias or sweats. Associated symptoms comments: Sinus pain. The symptoms are aggravated by lying down and cold air. He has tried prescription cough suppressant, oral steroids and a beta-agonist inhaler for the symptoms. The treatment provided no relief. His past medical history is significant for bronchitis.     Problems:  Patient Active Problem List   Diagnosis Date Noted   Abnormal CT of the chest 08/12/2023   Atopic dermatitis of both hands 01/30/2023   Pre-syncope 10/12/2022   Hypothyroidism 09/22/2022   Ventricular tachycardia (HCC) 01/06/2022   Morbid obesity (HCC) 10/26/2021   Chronic systolic  heart failure (HCC) 02/20/2021   Hyperlipidemia 08/15/2020   Ventricular tachycardia, monomorphic 09/14/2019   Left bundle branch block 09/14/2019   Thoracic aortic ectasia (HCC) 12/05/2018   Presence of cardiac defibrillator 10/19/2017   Advance directive on file 10/19/2017   Personal history of other  diseases of the digestive system 06/26/2011   Atherosclerotic heart disease of native coronary artery with angina pectoris (HCC) 06/01/2011   Prediabetes 05/05/2010    Allergies: No Known Allergies Medications:  Current Outpatient Medications:    albuterol  (VENTOLIN  HFA) 108 (90 Base) MCG/ACT inhaler, Inhale 1-2 puffs into the lungs every 6 (six) hours as needed., Disp: 8 g, Rfl: 0   amoxicillin -clavulanate (AUGMENTIN ) 875-125 MG tablet, Take 1 tablet by mouth 2 (two) times daily., Disp: 14 tablet, Rfl: 0   benzonatate  (TESSALON ) 100 MG capsule, Take 1-2 capsules (100-200 mg total) by mouth 3 (three) times daily as needed., Disp: 30 capsule, Rfl: 0   ipratropium (ATROVENT ) 0.03 % nasal spray, Place 2 sprays into both nostrils every 12 (twelve) hours., Disp: 30 mL, Rfl: 0   acetaminophen  (TYLENOL ) 500 MG tablet, Take 500 mg by mouth at bedtime., Disp: , Rfl:    alfuzosin (UROXATRAL) 10 MG 24 hr tablet, Take 10 mg by mouth daily., Disp: , Rfl:    amiodarone  (PACERONE ) 200 MG tablet, TAKE 1 TABLET(200 MG) BY MOUTH DAILY (Patient taking differently: TAKE 1 TABLET(200 MG) BY MOUTH MONDAY - FRIDAY), Disp: 90 tablet, Rfl: 3   ampicillin (PRINCIPEN) 500 MG capsule, Take by mouth., Disp: , Rfl:    aspirin  EC 81 MG tablet, Take 81 mg by mouth daily., Disp: , Rfl:    atorvastatin  (LIPITOR) 40 MG tablet, TAKE 1 TABLET(40 MG) BY MOUTH DAILY, Disp: 90 tablet, Rfl: 1   bethanechol (URECHOLINE) 25 MG tablet, Take 25 mg by mouth 2 (two) times daily., Disp: , Rfl:    fluticasone  (FLONASE ) 50 MCG/ACT nasal spray, Place 2 sprays into both nostrils daily. (Patient taking differently: Place 2 sprays into both nostrils as needed.), Disp: 16 g, Rfl: 6   furosemide  (LASIX ) 20 MG tablet, Take 1 tablet (20 mg total) by mouth daily., Disp: , Rfl:    levothyroxine  (SYNTHROID ) 88 MCG tablet, Take 1 tablet (88 mcg total) by mouth daily before breakfast., Disp: 30 tablet, Rfl: 6   loratadine (CLARITIN) 10 MG tablet, Take 10 mg  by mouth daily as needed for allergies., Disp: , Rfl:    metoprolol  succinate (TOPROL  XL) 25 MG 24 hr tablet, Take 0.5 tablets (12.5 mg total) by mouth at bedtime., Disp: 45 tablet, Rfl: 3   nitroGLYCERIN  (NITROSTAT ) 0.4 MG SL tablet, Place 1 tablet (0.4 mg total) under the tongue every 5 (five) minutes as needed for chest pain., Disp: 25 tablet, Rfl: 0   OVER THE COUNTER MEDICATION, Place 1 spray into both nostrils as needed (nasal congestion). Xytiol, Disp: , Rfl:    ranolazine  (RANEXA ) 500 MG 12 hr tablet, TAKE 1 TABLET BY MOUTH TWICE DAILY, Disp: 180 tablet, Rfl: 1   spironolactone  (ALDACTONE ) 25 MG tablet, TAKE 1/2 TABLET BY MOUTH EVERY NIGHT AT BEDTIME, Disp: 90 tablet, Rfl: 1  Observations/Objective: Patient is well-developed, well-nourished in no acute distress.  Resting comfortably at home.  Head is normocephalic, atraumatic.  No labored breathing.  Speech is clear and coherent with logical content.  Patient is alert and oriented at baseline.   Assessment and Plan: 1. Acute bacterial sinusitis (Primary) - amoxicillin -clavulanate (AUGMENTIN ) 875-125 MG tablet; Take 1 tablet by mouth 2 (  two) times daily.  Dispense: 14 tablet; Refill: 0 - benzonatate  (TESSALON ) 100 MG capsule; Take 1-2 capsules (100-200 mg total) by mouth 3 (three) times daily as needed.  Dispense: 30 capsule; Refill: 0 - albuterol  (VENTOLIN  HFA) 108 (90 Base) MCG/ACT inhaler; Inhale 1-2 puffs into the lungs every 6 (six) hours as needed.  Dispense: 8 g; Refill: 0 - ipratropium (ATROVENT ) 0.03 % nasal spray; Place 2 sprays into both nostrils every 12 (twelve) hours.  Dispense: 30 mL; Refill: 0  - Symptoms primarily now sinus congestion and drainage aggravating cough, suspected secondary sinus infection following a viral URI - Augmentin  added - Tessalon  perles for cough - Refilled Albuterol  for as needed use for wheezing and/or shortness of breath - Ipratropium Bromide  nasal spray for drainage to be used as needed -  Push fluids to stay hydrated - Rest as needed - Seek further evaluation in person if symptoms do not improve or if symptoms worsen  Follow Up Instructions: I discussed the assessment and treatment plan with the patient. The patient was provided an opportunity to ask questions and all were answered. The patient agreed with the plan and demonstrated an understanding of the instructions.  A copy of instructions were sent to the patient via MyChart unless otherwise noted below.    The patient was advised to call back or seek an in-person evaluation if the symptoms worsen or if the condition fails to improve as anticipated.    Delon CHRISTELLA Dickinson, PA-C

## 2023-12-28 NOTE — Patient Instructions (Signed)
 Bryan Miller, thank you for joining Delon CHRISTELLA Dickinson, PA-C for today's virtual visit.  While this provider is not your primary care provider (PCP), if your PCP is located in our provider database this encounter information will be shared with them immediately following your visit.   A [facility] MyChart account gives you access to today's visit and all your visits, tests, and labs performed at [facility]  click here if you don't have a [place] [facility] MyChart account or go to [email or web address]  Consent: (Patient) Bryan Miller provided verbal consent for this virtual visit at the beginning of the encounter.  Current Medications:  Current Outpatient Medications:    albuterol  (VENTOLIN  HFA) 108 (90 Base) MCG/ACT inhaler, Inhale 1-2 puffs into the lungs every 6 (six) hours as needed., Disp: 8 g, Rfl: 0   amoxicillin -clavulanate (AUGMENTIN ) 875-125 MG tablet, Take 1 tablet by mouth 2 (two) times daily., Disp: 14 tablet, Rfl: 0   benzonatate  (TESSALON ) 100 MG capsule, Take 1-2 capsules (100-200 mg total) by mouth 3 (three) times daily as needed., Disp: 30 capsule, Rfl: 0   ipratropium (ATROVENT ) 0.03 % nasal spray, Place 2 sprays into both nostrils every 12 (twelve) hours., Disp: 30 mL, Rfl: 0   acetaminophen  (TYLENOL ) 500 MG tablet, Take 500 mg by mouth at bedtime., Disp: , Rfl:    alfuzosin (UROXATRAL) 10 MG 24 hr tablet, Take 10 mg by mouth daily., Disp: , Rfl:    amiodarone  (PACERONE ) 200 MG tablet, TAKE 1 TABLET(200 MG) BY MOUTH DAILY (Patient taking differently: TAKE 1 TABLET(200 MG) BY MOUTH MONDAY - FRIDAY), Disp: 90 tablet, Rfl: 3   ampicillin (PRINCIPEN) 500 MG capsule, Take by mouth., Disp: , Rfl:    aspirin  EC 81 MG tablet, Take 81 mg by mouth daily., Disp: , Rfl:    atorvastatin  (LIPITOR) 40 MG tablet, TAKE 1 TABLET(40 MG) BY MOUTH DAILY, Disp: 90 tablet, Rfl: 1   bethanechol (URECHOLINE) 25 MG tablet, Take 25 mg by mouth 2 (two) times daily., Disp: , Rfl:     fluticasone  (FLONASE ) 50 MCG/ACT nasal spray, Place 2 sprays into both nostrils daily. (Patient taking differently: Place 2 sprays into both nostrils as needed.), Disp: 16 g, Rfl: 6   furosemide  (LASIX ) 20 MG tablet, Take 1 tablet (20 mg total) by mouth daily., Disp: , Rfl:    levothyroxine  (SYNTHROID ) 88 MCG tablet, Take 1 tablet (88 mcg total) by mouth daily before breakfast., Disp: 30 tablet, Rfl: 6   loratadine (CLARITIN) 10 MG tablet, Take 10 mg by mouth daily as needed for allergies., Disp: , Rfl:    metoprolol  succinate (TOPROL  XL) 25 MG 24 hr tablet, Take 0.5 tablets (12.5 mg total) by mouth at bedtime., Disp: 45 tablet, Rfl: 3   nitroGLYCERIN  (NITROSTAT ) 0.4 MG SL tablet, Place 1 tablet (0.4 mg total) under the tongue every 5 (five) minutes as needed for chest pain., Disp: 25 tablet, Rfl: 0   OVER THE COUNTER MEDICATION, Place 1 spray into both nostrils as needed (nasal congestion). Xytiol, Disp: , Rfl:    ranolazine  (RANEXA ) 500 MG 12 hr tablet, TAKE 1 TABLET BY MOUTH TWICE DAILY, Disp: 180 tablet, Rfl: 1   spironolactone  (ALDACTONE ) 25 MG tablet, TAKE 1/2 TABLET BY MOUTH EVERY NIGHT AT BEDTIME, Disp: 90 tablet, Rfl: 1   Medications ordered in this encounter:  Meds ordered this encounter  Medications   amoxicillin -clavulanate (AUGMENTIN ) 875-125 MG tablet    Sig: Take 1 tablet by mouth 2 (two) times daily.  Dispense:  14 tablet    Refill:  0    Supervising Provider:   LAMPTEY, PHILIP O [[id number]]   benzonatate  (TESSALON ) 100 MG capsule    Sig: Take 1-2 capsules (100-200 mg total) by mouth 3 (three) times daily as needed.    Dispense:  30 capsule    Refill:  0    Supervising Provider:   LAMPTEY, PHILIP O [[id number]]   albuterol  (VENTOLIN  HFA) 108 (90 Base) MCG/ACT inhaler    Sig: Inhale 1-2 puffs into the lungs every 6 (six) hours as needed.    Dispense:  8 g    Refill:  0    Supervising Provider:   BLAISE ALEENE KIDD [[id number]]   ipratropium (ATROVENT ) 0.03 % nasal spray     Sig: Place 2 sprays into both nostrils every 12 (twelve) hours.    Dispense:  30 mL    Refill:  0    Supervising Provider:   BLAISE ALEENE KIDD [[id number]]     *If you need refills on other medications prior to your next appointment, please contact your pharmacy*  Follow-Up: Call back or seek an in-person evaluation if the symptoms worsen or if the condition fails to improve as anticipated.  [facility] [phone]  Other Instructions  - Symptoms primarily now sinus congestion and drainage aggravating cough, suspected secondary sinus infection following a viral URI - Augmentin  added - Tessalon  perles for cough - Refilled Albuterol  for as needed use for wheezing and/or shortness of breath - Ipratropium Bromide  nasal spray for drainage to be used as needed - Push fluids to stay hydrated - Rest as needed - Seek further evaluation in person if symptoms do not improve or if symptoms worsen  Sinus Infection, Adult A sinus infection, also called sinusitis, is inflammation of your sinuses. Sinuses are hollow spaces in the bones around your face. Your sinuses are located: Around your eyes. In the middle of your forehead. Behind your nose. In your cheekbones. Mucus normally drains out of your sinuses. When your nasal tissues become inflamed or swollen, mucus can become trapped or blocked. This allows bacteria, viruses, and fungi to grow, which leads to infection. Most infections of the sinuses are caused by a virus. A sinus infection can develop quickly. It can last for up to 4 weeks (acute) or for more than 12 weeks (chronic). A sinus infection often develops after a cold. What are the causes? This condition is caused by anything that creates swelling in the sinuses or stops mucus from draining. This includes: Allergies. Asthma. Infection from bacteria or viruses. Deformities or blockages in your nose or sinuses. Abnormal growths in the nose (nasal polyps). Pollutants, such  as chemicals or irritants in the air. Infection from fungi. This is rare. What increases the risk? You are more likely to develop this condition if you: Have a weak body defense system (immune system). Do a lot of swimming or diving. Overuse nasal sprays. Smoke. What are the signs or symptoms? The main symptoms of this condition are pain and a feeling of pressure around the affected sinuses. Other symptoms include: Stuffy nose or congestion that makes it difficult to breathe through your nose. Thick yellow or greenish drainage from your nose. Tenderness, swelling, and warmth over the affected sinuses. A cough that may get worse at night. Decreased sense of smell and taste. Extra mucus that collects in the throat or the back of the nose (postnasal drip) causing a sore throat or bad breath. Tiredness (fatigue).  Fever. How is this diagnosed? This condition is diagnosed based on: Your symptoms. Your medical history. A physical exam. Tests to find out if your condition is acute or chronic. This may include: Checking your nose for nasal polyps. Viewing your sinuses using a device that has a light (endoscope). Testing for allergies or bacteria. Imaging tests, such as an MRI or CT scan. In rare cases, a bone biopsy may be done to rule out more serious types of fungal sinus disease. How is this treated? Treatment for a sinus infection depends on the cause and whether your condition is chronic or acute. If caused by a virus, your symptoms should go away on their own within 10 days. You may be given medicines to relieve symptoms. They include: Medicines that shrink swollen nasal passages (decongestants). A spray that eases inflammation of the nostrils (topical intranasal corticosteroids). Rinses that help get rid of thick mucus in your nose (nasal saline washes). Medicines that treat allergies (antihistamines). Over-the-counter pain relievers. If caused by bacteria, your health care  provider may recommend waiting to see if your symptoms improve. Most bacterial infections will get better without antibiotic medicine. You may be given antibiotics if you have: A severe infection. A weak immune system. If caused by narrow nasal passages or nasal polyps, surgery may be needed. Follow these instructions at home: Medicines Take, use, or apply over-the-counter and prescription medicines only as told by your health care provider. These may include nasal sprays. If you were prescribed an antibiotic medicine, take it as told by your health care provider. Do not stop taking the antibiotic even if you start to feel better. Hydrate and humidify  Drink enough fluid to keep your urine pale yellow. Staying hydrated will help to thin your mucus. Use a cool mist humidifier to keep the humidity level in your home above 50%. Inhale steam for 10-15 minutes, 3-4 times a day, or as told by your health care provider. You can do this in the bathroom while a hot shower is running. Limit your exposure to cool or dry air. Rest Rest as much as possible. Sleep with your head raised (elevated). Make sure you get enough sleep each night. General instructions  Apply a warm, moist washcloth to your face 3-4 times a day or as told by your health care provider. This will help with discomfort. Use nasal saline washes as often as told by your health care provider. Wash your hands often with soap and water to reduce your exposure to germs. If soap and water are not available, use hand sanitizer. Do not smoke. Avoid being around people who are smoking (secondhand smoke). Keep all follow-up visits. This is important. Contact a health care provider if: You have a fever. Your symptoms get worse. Your symptoms do not improve within 10 days. Get help right away if: You have a severe headache. You have persistent vomiting. You have severe pain or swelling around your face or eyes. You have vision  problems. You develop confusion. Your neck is stiff. You have trouble breathing. These symptoms may be an emergency. Get help right away. Call 911. Do not wait to see if the symptoms will go away. Do not drive yourself to the hospital. Summary A sinus infection is soreness and inflammation of your sinuses. Sinuses are hollow spaces in the bones around your face. This condition is caused by nasal tissues that become inflamed or swollen. The swelling traps or blocks the flow of mucus. This allows bacteria, viruses, and fungi  to grow, which leads to infection. If you were prescribed an antibiotic medicine, take it as told by your health care provider. Do not stop taking the antibiotic even if you start to feel better. Keep all follow-up visits. This is important. This information is not intended to replace advice given to you by your health care provider. Make sure you discuss any questions you have with your health care provider. Document Revised: 11/17/2021 Document Reviewed: 11/17/2021 Elsevier Patient Education  2024 Elsevier Inc.    If you have been instructed to have an in-person evaluation today at a local Urgent Care facility, please use the link below. It will take you to a list of all of our available [facility], including address, phone number and hours of operation. Please do not delay care.  [facility]  If you or a family member do not have a primary care provider, use the link below to schedule a visit and establish care. When you choose a [facility] primary care physician or advanced practice provider, you gain a long-term partner in health. Find a Primary Care Provider  Learn more about [facility]'s in-office and virtual care options: [place] - Get Care Now

## 2023-12-29 ENCOUNTER — Telehealth: Payer: Self-pay | Admitting: Pharmacy Technician

## 2023-12-29 ENCOUNTER — Other Ambulatory Visit (HOSPITAL_COMMUNITY): Payer: Self-pay

## 2023-12-29 NOTE — Telephone Encounter (Signed)
 Pharmacy Patient Advocate Encounter   Received notification from Physician's Office that prior authorization for jardiance  is required/requested.   Insurance verification completed.   The patient is insured through [employer] .   Per test claim: The current 12/29/23 day co-pay is, $278.18 one month (deductible).  No PA needed at this time. This test claim was processed through [facility]- copay amounts may vary at other pharmacies due to pharmacy/plan contracts, or as the patient moves through the different stages of their insurance plan.

## 2023-12-29 NOTE — Telephone Encounter (Signed)
-----   Message from Lonni Bolk sent at 12/29/2023 10:15 AM EST ----- Regarding: FW: Repatha  and SGLT2 assistance Can you run a test claim on patients Jardiance  and Repatha ? ----- Message ----- From: Trudy [place], PA-C Sent: 12/29/2023   9:58 AM EST To: Cv Div Pharmd Subject: RE: Repatha  and SGLT2 assistance               Hey again,  Now that we've moved into 2025, is there a way to run test quotes on these?  [place] Trudy, PA-C ----- Message ----- From: Pavero, Christopher, [facility] Sent: 12/23/2023   4:52 PM EST To: [place] Trudy, PA-C Subject: RE: Repatha  and SGLT2 assistance               Well both medications have pretty good patient assistance programs but honestly I would just have him wait until January 1st when his benefits reset ----- Message ----- From: Trudy [place], PA-C Sent: 12/23/2023   4:24 PM EST To: Cv Div Pharmd Subject: Repatha  and SGLT2 assistance                   This patient has stopped his Jardiance  and Repatha  due to inability to afford. It sounds like he's stuck in the donut hole. Are there any options for cost assistance? Perhaps this question will be irrelevant next year with medicare changes but I want to offer whatever assistance I can.  Thanks!  [place] Trudy, PA-C

## 2023-12-29 NOTE — Telephone Encounter (Signed)
 Pharmacy Patient Advocate Encounter   Received notification from Patient Advice Request messages that prior authorization for repatha  is required/requested.   Insurance verification completed.   The patient is insured through [employer] .   Per test claim: The current 12/29/23 day co-pay is, $278.18 one month (deductible).  No PA needed at this time. This test claim was processed through [facility]- copay amounts may vary at other pharmacies due to pharmacy/plan contracts, or as the patient moves through the different stages of their insurance plan.

## 2024-01-03 ENCOUNTER — Telehealth: Payer: Self-pay

## 2024-01-03 ENCOUNTER — Other Ambulatory Visit (HOSPITAL_COMMUNITY): Payer: Self-pay

## 2024-01-03 ENCOUNTER — Ambulatory Visit: Payer: Medicare HMO | Admitting: Physician Assistant

## 2024-01-03 NOTE — Telephone Encounter (Signed)
 Patient Advocate Encounter   The patient was approved for a [employer] grant that will help cover the cost of JARDIANCE  Total amount awarded, $10,000.  Effective: 12/04/23 - 12/02/24   APW:[id number] ERW:[id number] Hmnle:[id number] PI:[id number]   Ileana Lehmann, CPhT  Pharmacy Patient Advocate Specialist  Direct Number: [phone] Fax: [phone]

## 2024-01-03 NOTE — Telephone Encounter (Signed)
-----   Message from Lonni Bolk sent at 12/29/2023  4:27 PM EST ----- Regarding: FW: Repatha  and SGLT2 assistance Shayaan Parke, can you see if this patient might be eligible for assistance? ----- Message ----- From: Trudy Artist RIGGERS Sent: 12/29/2023   3:48 PM EST To: Lonni Bolk, RPH Subject: RE: Repatha  and SGLT2 assistance               Thanks for the update. With that in mind, would we be able to evaluate for assistance programs?  Artist Trudy, PA-C ----- Message ----- From: Bolk Lonni, [facility] Sent: 12/29/2023   3:21 PM EST To: Artist Trudy, PA-C Subject: RE: Repatha  and SGLT2 assistance               Both Jardiance  and Repatha  remain expensive due to his deductible he has to meet ----- Message ----- From: Billy Camelia BIRCH, CPhT Sent: 12/29/2023  10:56 AM EST To: Lonni Bolk, RPH Subject: RE: Repatha  and SGLT2 assistance               Jardiance  $278.18 one month,  repatha  $278.18 one month-- deductible. See encounters 12/29/23 for additional information ----- Message ----- From: Bolk Lonni, [facility] Sent: 12/29/2023  10:15 AM EST To: Artist Trudy, PA-C; Rx Prior Auth Team Subject: FW: Repatha  and SGLT2 assistance               Can you run a test claim on patients Jardiance  and Repatha ? ----- Message ----- From: Trudy Artist, RIGGERS Sent: 12/29/2023   9:58 AM EST To: Cv Div Pharmd Subject: RE: Repatha  and SGLT2 assistance               Hey again,  Now that we've moved into 2025, is there a way to run test quotes on these?  Artist Trudy, PA-C ----- Message ----- From: Pavero, Christopher, [facility] Sent: 12/23/2023   4:52 PM EST To: Artist Trudy, PA-C Subject: RE: Repatha  and SGLT2 assistance               Well both medications have pretty good patient assistance programs but honestly I would just have him wait until January 1st when his benefits reset ----- Message ----- From: Trudy Artist, PA-C Sent: 12/23/2023   4:24 PM EST To: Cv Div  Pharmd Subject: Repatha  and SGLT2 assistance                   This patient has stopped his Jardiance  and Repatha  due to inability to afford. It sounds like he's stuck in the donut hole. Are there any options for cost assistance? Perhaps this question will be irrelevant next year with medicare changes but I want to offer whatever assistance I can.  Thanks!  Artist Trudy, PA-C

## 2024-01-03 NOTE — Telephone Encounter (Signed)
 Patient Advocate Encounter   The patient was approved for a [employer] grant that will help cover the cost of REPATHA  Total amount awarded, $2500.  Effective: 12/04/23 - 12/02/24   APW:[id number] ERW:[id number] Hmnle:[id number] PI:[id number]  Ileana Lehmann, CPhT  Pharmacy Patient Advocate Specialist  Direct Number: [phone] Fax: [phone]

## 2024-01-04 ENCOUNTER — Other Ambulatory Visit: Payer: Self-pay

## 2024-01-19 MED ORDER — REPATHA SURECLICK 140 MG/ML ~~LOC~~ SOAJ
1.0000 mL | SUBCUTANEOUS | 3 refills | Status: DC
Start: 2024-01-19 — End: 2024-11-06

## 2024-01-19 MED ORDER — EMPAGLIFLOZIN 10 MG PO TABS: 10.0000 mg | ORAL_TABLET | Freq: Every day | ORAL | 3 refills | Status: DC

## 2024-01-19 NOTE — Telephone Encounter (Signed)
Spoke with patient and he states he has picked up Jardiance but I do not see that on his med list. He was given healthwell grant below.  He is also requesting the information on repatha and help with cost as well

## 2024-01-19 NOTE — Addendum Note (Signed)
Addended by: Malena Peer D on: 01/19/2024 03:52 PM   Modules accepted: Orders

## 2024-01-19 NOTE — Telephone Encounter (Signed)
Jardiance rx sent along with Repatha Dorathy Daft can you get him hypercholesterolemia grant too please?

## 2024-01-19 NOTE — Telephone Encounter (Signed)
Pt saw Perlie Gold. Please forward to him. Hesham Wommack, PA-C    01/19/2024 3:51 PM

## 2024-01-20 ENCOUNTER — Telehealth: Payer: Self-pay | Admitting: Pharmacy Technician

## 2024-01-20 ENCOUNTER — Encounter: Payer: Self-pay | Admitting: Pharmacy Technician

## 2024-01-20 NOTE — Telephone Encounter (Signed)
PER 01/03/24 encounter: Haze Rushing, [facility]     01/03/24 11:15 AM Note Patient Advocate Encounter   The patient was approved for a [employer] grant that will help cover the cost of REPATHA Total amount awarded, $2500.  Effective: 12/04/23 - 12/02/24   ZOX:[id number] WUJ:[id number] [id number] QI:[id number]     I am called [facility] and they said they did not have this information. I provided them the information and they processed the prescription for 0.00 charge.   They also said they did not have the grant info for the jardiance so I gave them that and that is now 0.00  Pt will be able to pick up both today. I sent the patient a mychart message

## 2024-01-20 NOTE — Telephone Encounter (Signed)
I am called [facility] and they said they did not have the grant information for repatha. I provided them the information and they processed the prescription for 0.00 charge.   They also said they did not have the grant info for the jardiance so I gave them that and that is now 0.00  Pt will be able to pick up both today. I sent the patient a mychart message

## 2024-01-23 ENCOUNTER — Ambulatory Visit: Payer: Medicare HMO

## 2024-01-30 ENCOUNTER — Ambulatory Visit (INDEPENDENT_AMBULATORY_CARE_PROVIDER_SITE_OTHER): Payer: Medicare HMO

## 2024-01-30 DIAGNOSIS — I472 Ventricular tachycardia, unspecified: Secondary | ICD-10-CM

## 2024-01-30 DIAGNOSIS — I5022 Chronic systolic (congestive) heart failure: Secondary | ICD-10-CM | POA: Diagnosis not present

## 2024-02-01 ENCOUNTER — Telehealth: Payer: Self-pay | Admitting: Pharmacy Technician

## 2024-02-01 NOTE — Telephone Encounter (Signed)
 Patient called and said [facility] refused to give his repatha  stating it was illegal to run on a coupon. I called [facility] and explained it is not a coupon, its a grant. They filled his repatha  on ins and healthwell and I called the pt  back and he will pick up today.

## 2024-02-06 NOTE — Progress Notes (Signed)
Subjective:  Patient ID: Bryan Miller, male    DOB: Mar 06, 1951  Age: 73 y.o. MRN: [id number]  Chief Complaint  Patient presents with   Medical Management of Chronic Issues    HPI   The patient, with a history of heart disease and bladder issues, presents for a regular six-month follow-up. He reports feeling well and not being sick. He has recently started taking Repatha and Jardiance again, thanks to a grant from his heart doctor. He also takes baby aspirin daily. He is on amiodarone five days a week, a dosing regimen prescribed by his heart doctor. He has been taking bethanechol for his bladder and has recently completed a course of ampicillin. He reports that his bladder is not hurting like it was and the infection is gone. He also reports a fall, which he attributes to playing around and going up a hill. He did not hurt himself in the fall. He also reports occasional headaches, which he manages with Tylenol at night. He reports no other pain except for some pain in his neck, which he attributes to arthritis or possibly the way he slept. He manages this pain by exercising regularly, doing about six miles at a time.  Patient presents with hyperlipidemia.  Compliance with treatment has been good; patient takes medicines as directed, maintains low cholesterol diet, follows up as directed, and maintains exercise regimen.     Hypothyroidism: Taking Levothyroxine 88 mcg daily.   Prediabetes: Taking Jardiance 10 mg daily. (Taking half tablet as he is unable to afford it. ) Just recently started taking due to be approved for PAP.)   CHF: He takes spironolactone 25 mg 1/2 tablet daily, furosemide 20 mg daily, and jardiance 10 mg daily   CORONARY ARTERY DISEASE: on ranolazine 500 mg twice daily, toprol xl 25 mg daily, repatha, aspirin.  Taking repatha every 2 weeks. Just recently started taking due to be approved for PAP.   Ventricular tachycardia: Currently on amiodarone 200 daily Monday through  Friday.     02/07/2024    3:20 PM 08/09/2023    9:21 AM 08/04/2023    8:43 AM 01/27/2023    3:54 PM 07/21/2022   10:52 AM  Depression screen PHQ 2/9  Decreased Interest 0 0 0 0 0  Down, Depressed, Hopeless 0 0 0 0 0  PHQ - 2 Score 0 0 0 0 0  Altered sleeping  0 0    Tired, decreased energy  0 1    Change in appetite  0 0    Feeling bad or failure about yourself   0 0    Trouble concentrating  0 0    Moving slowly or fidgety/restless  0 0    Suicidal thoughts  0 0    PHQ-9 Score  0 1    Difficult doing work/chores  Not difficult at all Not difficult at all          08/09/2023    9:40 AM  Fall Risk   Falls in the past year? 1  Number falls in past yr: 0  Injury with Fall? 1  Risk for fall due to : History of fall(s)  Follow up Falls evaluation completed    Patient Care Team: Blane Ohara, MD as PCP - General (Family Medicine) Duke Salvia, MD as Consulting Physician (Cardiology)   Review of Systems  Constitutional:  Negative for chills, diaphoresis, fatigue and fever.  HENT:  Negative for congestion, ear pain and sore throat.  Respiratory:  Negative for cough and shortness of breath.   Cardiovascular:  Negative for chest pain and leg swelling.  Gastrointestinal:  Negative for abdominal pain, constipation, diarrhea, nausea and vomiting.  Genitourinary:  Negative for dysuria and urgency.  Musculoskeletal:  Negative for arthralgias and myalgias.  Neurological:  Negative for dizziness and headaches.  Psychiatric/Behavioral:  Negative for dysphoric mood.     Current Outpatient Medications on File Prior to Visit  Medication Sig Dispense Refill   acetaminophen (TYLENOL) 500 MG tablet Take 500 mg by mouth at bedtime.     albuterol (VENTOLIN HFA) 108 (90 Base) MCG/ACT inhaler Inhale 1-2 puffs into the lungs every 6 (six) hours as needed. 8 g 0   amiodarone (PACERONE) 200 MG tablet TAKE 1 TABLET(200 MG) BY MOUTH DAILY (Patient taking differently: TAKE 1 TABLET(200 MG) BY MOUTH  MONDAY - FRIDAY) 90 tablet 3   aspirin EC 81 MG tablet Take 81 mg by mouth daily.     bethanechol (URECHOLINE) 25 MG tablet Take 25 mg by mouth 2 (two) times daily.     empagliflozin (JARDIANCE) 10 MG TABS tablet Take 1 tablet (10 mg total) by mouth daily before breakfast. 90 tablet 3   Evolocumab (REPATHA SURECLICK) 140 MG/ML SOAJ Inject 140 mg into the skin every 14 (fourteen) days. 6 mL 3   fluticasone (FLONASE) 50 MCG/ACT nasal spray Place 2 sprays into both nostrils daily. (Patient taking differently: Place 2 sprays into both nostrils as needed.) 16 g 6   furosemide (LASIX) 20 MG tablet Take 1 tablet (20 mg total) by mouth daily.     ipratropium (ATROVENT) 0.03 % nasal spray Place 2 sprays into both nostrils every 12 (twelve) hours. 30 mL 0   levothyroxine (SYNTHROID) 88 MCG tablet Take 1 tablet (88 mcg total) by mouth daily before breakfast. 30 tablet 6   loratadine (CLARITIN) 10 MG tablet Take 10 mg by mouth daily as needed for allergies.     metoprolol succinate (TOPROL-XL) 25 MG 24 hr tablet TAKE 1 TABLET(25 MG) BY MOUTH AT BEDTIME 90 tablet 0   OVER THE COUNTER MEDICATION Place 1 spray into both nostrils as needed (nasal congestion). Xytiol     ranolazine (RANEXA) 500 MG 12 hr tablet TAKE 1 TABLET BY MOUTH TWICE DAILY 180 tablet 1   spironolactone (ALDACTONE) 25 MG tablet TAKE 1/2 TABLET BY MOUTH EVERY NIGHT AT BEDTIME 90 tablet 1   No current facility-administered medications on file prior to visit.   Past Medical History:  Diagnosis Date   CAD (coronary artery disease)    Chronic kidney disease, stage 3a (HCC)    Chronic systolic CHF (congestive heart failure) (HCC)    Dilated aortic root (HCC) 12/05/2018   Hyperlipidemia 08/15/2020   Hypothyroidism    Left bundle branch block 09/14/2019   Old myocardial infarction 06/26/2011   Prediabetes 05/05/2010   Presence of cardiac defibrillator    S/P CABG (coronary artery bypass graft)    Ventricular tachycardia, monomorphic (HCC)     Past Surgical History:  Procedure Laterality Date   ABDOMINAL HERNIA REPAIR  2020   bypass  2001   CHOLECYSTECTOMY  1996   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   LEFT HEART CATH AND CORS/GRAFTS ANGIOGRAPHY N/A 01/07/2022   Procedure: LEFT HEART CATH AND CORS/GRAFTS ANGIOGRAPHY;  Surgeon: Lyn Records, MD;  Location: [facility];  Service: Cardiovascular;  Laterality: N/A;    Family History  Problem Relation Age of Onset   Dementia Mother  Heart Problems Mother    Heart Problems Father    Social History   Socioeconomic History   Marital status: Married    Spouse name: Darl Pikes   Number of children: 3   Years of education: Not on file   Highest education level: Not on file  Occupational History   Not on file  Tobacco Use   Smoking status: Former    Current packs/day: 0.00    Types: Cigarettes    Quit date: 1970    Years since quitting: 55.1   Smokeless tobacco: Never  Vaping Use   Vaping status: Never Used  Substance and Sexual Activity   Alcohol use: Not Currently   Drug use: Never   Sexual activity: Yes    Partners: Female  Other Topics Concern   Not on file  Social History Narrative   Retired - currently Armed forces operational officer at [employer].  Lives with wife, moved back to [place] from [place] a few if years ago.  Has one son and two daughters.   Social Drivers of Corporate investment banker Strain: Low Risk  (08/04/2023)   Overall Financial Resource Strain (CARDIA)    Difficulty of Paying Living Expenses: Not hard at all  Food Insecurity: No Food Insecurity (08/09/2023)   Hunger Vital Sign    Worried About Running Out of Food in the Last Year: Never true    Ran Out of Food in the Last Year: Never true  Transportation Needs: No Transportation Needs (08/09/2023)   PRAPARE - Administrator, Civil Service (Medical): No    Lack of Transportation (Non-Medical): No  Physical Activity: Sufficiently Active (08/09/2023)   Exercise Vital Sign    Days of Exercise  per Week: 7 days    Minutes of Exercise per Session: 60 min  Stress: No Stress Concern Present (08/09/2023)   [employer] of Occupational Health - Occupational Stress Questionnaire    Feeling of Stress : Not at all  Social Connections: Moderately Integrated (08/04/2023)   Social Connection and Isolation Panel [NHANES]    Frequency of Communication with Friends and Family: More than three times a week    Frequency of Social Gatherings with Friends and Family: More than three times a week    Attends Religious Services: More than 4 times per year    Active Member of [employer] or Organizations: No    Attends Engineer, structural: Never    Marital Status: Married    Objective:  BP 134/74   Pulse 71   Temp (!) 97.1 F (36.2 C)   Ht 6\' 3"  (1.905 m)   Wt 261 lb (118.4 kg)   SpO2 97%   BMI 32.62 kg/m      02/07/2024    3:19 PM 12/23/2023    3:25 PM 11/10/2023    1:57 PM  BP/Weight  Systolic BP 134 106 118  Diastolic BP 74 62 73  Wt. (Lbs) 261 255 254.4  BMI 32.62 kg/m2 31.87 kg/m2 31.8 kg/m2    Physical Exam Vitals reviewed.  Constitutional:      Appearance: Normal appearance. He is obese.  Neck:     Vascular: No carotid bruit.  Cardiovascular:     Rate and Rhythm: Normal rate and regular rhythm.     Heart sounds: Normal heart sounds.  Pulmonary:     Effort: Pulmonary effort is normal.     Breath sounds: Normal breath sounds. No wheezing, rhonchi or rales.  Abdominal:     General: Bowel  sounds are normal.     Palpations: Abdomen is soft.     Tenderness: There is no abdominal tenderness.  Neurological:     Mental Status: He is alert and oriented to person, place, and time.  Psychiatric:        Mood and Affect: Mood normal.        Behavior: Behavior normal.     Diabetic Foot Exam - Simple   No data filed      Lab Results  Component Value Date   WBC 8.6 02/07/2024   HGB 15.1 02/07/2024   HCT 45.2 02/07/2024   PLT 198 02/07/2024   GLUCOSE 109 (H)  02/07/2024   CHOL 113 10/03/2023   TRIG 216 (H) 10/03/2023   HDL 44 10/03/2023   LDLCALC 35 10/03/2023   ALT 13 02/07/2024   AST 18 02/07/2024   NA 141 02/07/2024   K 4.0 02/07/2024   CL 102 02/07/2024   CREATININE 1.34 (H) 02/07/2024   BUN 15 02/07/2024   CO2 27 02/07/2024   TSH 3.170 09/23/2023   HGBA1C 5.6 02/07/2024      Assessment & Plan:    Atherosclerosis of native coronary artery of native heart with angina pectoris [facility]) Assessment & Plan: Continue repatha, ranolazine, toprol xl and aspirin.  Management per specialist.     Chronic systolic heart failure (HCC) Assessment & Plan: Stable on current regimen. Recently resumed Jardiance and Repatha due to grant availability. -Continue current medications including Jardiance, Repatha, baby aspirin, amiodarone (5 days/week), furosemide 20mg  tablet daily, spironolactone (half of 25mg  tablet daily), and ranolazine (2 tablets daily).   Prediabetes Assessment & Plan: Recommend continue to work on eating healthy diet and exercise.   Orders: -     Hemoglobin A1c  Mixed hyperlipidemia Assessment & Plan: Well controlled.  No changes to medicines. Recently resumed Repatha due to grant availability.Continue to work on eating a healthy diet and exercise.    Orders: -     CBC with Differential/Platelet -     Comprehensive metabolic panel  Need for hepatitis C screening test -     HCV Ab w Reflex to Quant PCR  Acquired hypothyroidism Assessment & Plan: The current medical regimen is effective;  continue present plan and medications. Continue levothyroxine 88 mcg once daily in am.    Other orders -     Nitroglycerin; Place 1 tablet (0.4 mg total) under the tongue every 5 (five) minutes as needed for chest pain.  Dispense: 25 tablet; Refill: 1   General Health Maintenance -Recommend tetanus vaccine and new shingles vaccine series at pharmacy. -Declined flu shot at this time. -Refill nitroglycerin  prescription. -Order routine blood work.  Meds ordered this encounter  Medications   nitroGLYCERIN (NITROSTAT) 0.4 MG SL tablet    Sig: Place 1 tablet (0.4 mg total) under the tongue every 5 (five) minutes as needed for chest pain.    Dispense:  25 tablet    Refill:  1    Orders Placed This Encounter  Procedures   CBC with Differential/Platelet   Comprehensive metabolic panel   Hemoglobin A1c   HCV Ab w Reflex to Quant PCR     Follow-up: Return in about 3 months (around 05/06/2024) for chronic fasting.   I,Marla I Leal-Borjas,acting as a scribe for Blane Ohara, MD.,have documented all relevant documentation on the behalf of Blane Ohara, MD,as directed by  Blane Ohara, MD while in the presence of Blane Ohara, MD.   An After Visit Summary was  printed and given to the patient.  I attest that I have reviewed this visit and agree with the plan scribed by my staff.   Blane Ohara, MD [facility] [phone]

## 2024-02-07 ENCOUNTER — Encounter: Payer: Self-pay | Admitting: Family Medicine

## 2024-02-07 ENCOUNTER — Ambulatory Visit (INDEPENDENT_AMBULATORY_CARE_PROVIDER_SITE_OTHER): Payer: Medicare HMO | Admitting: Family Medicine

## 2024-02-07 VITALS — BP 134/74 | HR 71 | Temp 97.1°F | Ht 75.0 in | Wt 261.0 lb

## 2024-02-07 DIAGNOSIS — R7303 Prediabetes: Secondary | ICD-10-CM | POA: Diagnosis not present

## 2024-02-07 DIAGNOSIS — E063 Autoimmune thyroiditis: Secondary | ICD-10-CM

## 2024-02-07 DIAGNOSIS — E039 Hypothyroidism, unspecified: Secondary | ICD-10-CM | POA: Diagnosis not present

## 2024-02-07 DIAGNOSIS — I25119 Atherosclerotic heart disease of native coronary artery with unspecified angina pectoris: Secondary | ICD-10-CM | POA: Diagnosis not present

## 2024-02-07 DIAGNOSIS — I5022 Chronic systolic (congestive) heart failure: Secondary | ICD-10-CM | POA: Diagnosis not present

## 2024-02-07 DIAGNOSIS — E782 Mixed hyperlipidemia: Secondary | ICD-10-CM

## 2024-02-07 DIAGNOSIS — Z1159 Encounter for screening for other viral diseases: Secondary | ICD-10-CM

## 2024-02-07 MED ORDER — NITROGLYCERIN 0.4 MG SL SUBL: 0.4000 mg | SUBLINGUAL_TABLET | SUBLINGUAL | 1 refills | Status: AC | PRN

## 2024-02-08 ENCOUNTER — Encounter: Payer: Self-pay | Admitting: Family Medicine

## 2024-02-08 ENCOUNTER — Other Ambulatory Visit: Payer: Self-pay

## 2024-02-08 DIAGNOSIS — N289 Disorder of kidney and ureter, unspecified: Secondary | ICD-10-CM

## 2024-02-08 LAB — CBC WITH DIFFERENTIAL/PLATELET
Basophils Absolute: 0 10*3/uL (ref 0.0–0.2)
Basos: 0 %
EOS (ABSOLUTE): 0.3 10*3/uL (ref 0.0–0.4)
Eos: 4 %
Hematocrit: 45.2 % (ref 37.5–51.0)
Hemoglobin: 15.1 g/dL (ref 13.0–17.7)
Immature Grans (Abs): 0 10*3/uL (ref 0.0–0.1)
Immature Granulocytes: 0 %
Lymphocytes Absolute: 2.7 10*3/uL (ref 0.7–3.1)
Lymphs: 31 %
MCH: 33.3 pg — ABNORMAL HIGH (ref 26.6–33.0)
MCHC: 33.4 g/dL (ref 31.5–35.7)
MCV: 100 fL — ABNORMAL HIGH (ref 79–97)
Monocytes Absolute: 0.9 10*3/uL (ref 0.1–0.9)
Monocytes: 10 %
Neutrophils Absolute: 4.7 10*3/uL (ref 1.4–7.0)
Neutrophils: 55 %
Platelets: 198 10*3/uL (ref 150–450)
RBC: 4.54 x10E6/uL (ref 4.14–5.80)
RDW: 12.2 % (ref 11.6–15.4)
WBC: 8.6 10*3/uL (ref 3.4–10.8)

## 2024-02-08 LAB — HEMOGLOBIN A1C
Est. average glucose Bld gHb Est-mCnc: 114 mg/dL
Hgb A1c MFr Bld: 5.6 % (ref 4.8–5.6)

## 2024-02-08 LAB — COMPREHENSIVE METABOLIC PANEL
ALT: 13 [IU]/L (ref 0–44)
AST: 18 [IU]/L (ref 0–40)
Albumin: 4.1 g/dL (ref 3.8–4.8)
Alkaline Phosphatase: 77 [IU]/L (ref 44–121)
BUN/Creatinine Ratio: 11 (ref 10–24)
BUN: 15 mg/dL (ref 8–27)
Bilirubin Total: 0.7 mg/dL (ref 0.0–1.2)
CO2: 27 mmol/L (ref 20–29)
Calcium: 9.3 mg/dL (ref 8.6–10.2)
Chloride: 102 mmol/L (ref 96–106)
Creatinine, Ser: 1.34 mg/dL — ABNORMAL HIGH (ref 0.76–1.27)
Globulin, Total: 2.2 g/dL (ref 1.5–4.5)
Glucose: 109 mg/dL — ABNORMAL HIGH (ref 70–99)
Potassium: 4 mmol/L (ref 3.5–5.2)
Sodium: 141 mmol/L (ref 134–144)
Total Protein: 6.3 g/dL (ref 6.0–8.5)
eGFR: 56 mL/min/{1.73_m2} — ABNORMAL LOW (ref >59)

## 2024-02-08 LAB — HCV AB W REFLEX TO QUANT PCR: HCV Ab: NONREACTIVE

## 2024-02-09 DIAGNOSIS — M9902 Segmental and somatic dysfunction of thoracic region: Secondary | ICD-10-CM | POA: Diagnosis not present

## 2024-02-09 DIAGNOSIS — M5441 Lumbago with sciatica, right side: Secondary | ICD-10-CM | POA: Diagnosis not present

## 2024-02-09 DIAGNOSIS — M51362 Other intervertebral disc degeneration, lumbar region with discogenic back pain and lower extremity pain: Secondary | ICD-10-CM | POA: Diagnosis not present

## 2024-02-09 DIAGNOSIS — M9903 Segmental and somatic dysfunction of lumbar region: Secondary | ICD-10-CM | POA: Diagnosis not present

## 2024-02-09 DIAGNOSIS — M9904 Segmental and somatic dysfunction of sacral region: Secondary | ICD-10-CM | POA: Diagnosis not present

## 2024-02-10 LAB — CUP PACEART REMOTE DEVICE CHECK
Battery Remaining Longevity: 60 mo
Battery Remaining Percentage: 86 %
Brady Statistic RA Percent Paced: 6 %
Brady Statistic RV Percent Paced: 96 %
Date Time Interrogation Session: 20250128004100
HighPow Impedance: 67 Ohm
Implantable Lead Connection Status: 753985
Implantable Lead Connection Status: 753985
Implantable Lead Connection Status: 753985
Implantable Lead Implant Date: 20181022
Implantable Lead Implant Date: 20181022
Implantable Lead Implant Date: 20181022
Implantable Lead Location: 753858
Implantable Lead Location: 753859
Implantable Lead Location: 753860
Implantable Lead Model: 293
Implantable Lead Model: 4671
Implantable Lead Model: 7741
Implantable Lead Serial Number: 423377
Implantable Lead Serial Number: 809750
Implantable Lead Serial Number: 954460
Implantable Pulse Generator Implant Date: 20181022
Lead Channel Impedance Value: 431 Ohm
Lead Channel Impedance Value: 626 Ohm
Lead Channel Impedance Value: 918 Ohm
Lead Channel Pacing Threshold Amplitude: 0.7 V
Lead Channel Pacing Threshold Amplitude: 0.7 V
Lead Channel Pacing Threshold Amplitude: 1.4 V
Lead Channel Pacing Threshold Pulse Width: 0.4 ms
Lead Channel Pacing Threshold Pulse Width: 0.4 ms
Lead Channel Pacing Threshold Pulse Width: 0.4 ms
Lead Channel Setting Pacing Amplitude: 2 V
Lead Channel Setting Pacing Amplitude: 2 V
Lead Channel Setting Pacing Amplitude: 2.6 V
Lead Channel Setting Pacing Pulse Width: 0.4 ms
Lead Channel Setting Pacing Pulse Width: 0.4 ms
Lead Channel Setting Sensing Sensitivity: 0.6 mV
Lead Channel Setting Sensing Sensitivity: 1 mV
Pulse Gen Serial Number: 181086

## 2024-02-11 DIAGNOSIS — Z1159 Encounter for screening for other viral diseases: Secondary | ICD-10-CM | POA: Insufficient documentation

## 2024-02-11 NOTE — Assessment & Plan Note (Signed)
Continue repatha, ranolazine, toprol xl and aspirin.  Management per specialist.

## 2024-02-11 NOTE — Assessment & Plan Note (Signed)
 Recommend continue to work on eating healthy diet and exercise.

## 2024-02-11 NOTE — Assessment & Plan Note (Addendum)
Stable on current regimen. Recently resumed Jardiance and Repatha due to grant availability. -Continue current medications including Jardiance, Repatha, baby aspirin, amiodarone (5 days/week), furosemide 20mg  tablet daily, spironolactone (half of 25mg  tablet daily), and ranolazine (2 tablets daily).

## 2024-02-11 NOTE — Assessment & Plan Note (Signed)
Well controlled.  No changes to medicines. Recently resumed Repatha due to grant availability.Continue to work on eating a healthy diet and exercise.

## 2024-02-11 NOTE — Assessment & Plan Note (Signed)
The current medical regimen is effective;  continue present plan and medications. Continue levothyroxine 88 mcg once daily in am.

## 2024-02-13 ENCOUNTER — Telehealth: Payer: Self-pay

## 2024-02-13 NOTE — Telephone Encounter (Signed)
Alert remote transmission: Antitachycardia pacing (ATP) therapy delivered to convert arrhythmia. 1 VT-1 classified episode on 02/10/24 at 22:23, 25 sec, EGM c/w V>A conduction appropriately and successfully terminated with ATP x 1 Burst. Average V rate 149 bpm; routed to clinic for review.  Follow up as scheduled. [facility], [facility]  Called patient. Asymptomatic. Compliant w/ cardiac med reg. Advised of 6 month driving restrictions. ED precautions given. Sent to MD for review.

## 2024-02-16 ENCOUNTER — Encounter: Payer: Self-pay | Admitting: Internal Medicine

## 2024-02-16 NOTE — Telephone Encounter (Signed)
This is guy CH is past due-- can we try and find a place to put him  thanks [facility]

## 2024-02-21 ENCOUNTER — Other Ambulatory Visit: Payer: Self-pay

## 2024-02-21 DIAGNOSIS — N289 Disorder of kidney and ureter, unspecified: Secondary | ICD-10-CM

## 2024-02-22 ENCOUNTER — Ambulatory Visit: Payer: Medicare HMO

## 2024-02-22 DIAGNOSIS — N289 Disorder of kidney and ureter, unspecified: Secondary | ICD-10-CM | POA: Diagnosis not present

## 2024-02-23 ENCOUNTER — Encounter: Payer: Self-pay | Admitting: Family Medicine

## 2024-02-23 LAB — COMPREHENSIVE METABOLIC PANEL
ALT: 12 [IU]/L (ref 0–44)
AST: 16 [IU]/L (ref 0–40)
Albumin: 3.8 g/dL (ref 3.8–4.8)
Alkaline Phosphatase: 86 [IU]/L (ref 44–121)
BUN/Creatinine Ratio: 12 (ref 10–24)
BUN: 13 mg/dL (ref 8–27)
Bilirubin Total: 0.7 mg/dL (ref 0.0–1.2)
CO2: 24 mmol/L (ref 20–29)
Calcium: 9 mg/dL (ref 8.6–10.2)
Chloride: 101 mmol/L (ref 96–106)
Creatinine, Ser: 1.07 mg/dL (ref 0.76–1.27)
Globulin, Total: 2.3 g/dL (ref 1.5–4.5)
Glucose: 95 mg/dL (ref 70–99)
Potassium: 4.4 mmol/L (ref 3.5–5.2)
Sodium: 140 mmol/L (ref 134–144)
Total Protein: 6.1 g/dL (ref 6.0–8.5)
eGFR: 73 mL/min/{1.73_m2} (ref >59)

## 2024-02-23 NOTE — Progress Notes (Unsigned)
 Cardiology Office Note:  .   Date:  02/23/2024  ID:  ASH MCELWAIN, DOB [birth year], MRN [id number] PCP: Blane Ohara, MD  [facility] Providers Cardiologist:  None {  History of Present Illness: .   Bryan Miller is a 73 y.o. male w/PMHx of ICM w/CRT-D, CAD s/p 2v CABG,  CKD III, VT, hypothyroidism, HTN, HLD, VT  Saw Dr. 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 echo in 2023 with WMA, suspect scar mediated VT   ROS:   He is accompanied by his wife The day he had the VT had been a particularly emotional/stressful day, they suspect perhaps contributed to the event Generally feels tired much of the time He does continue to teach, 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 Historically had been on sotalol and back and forth on/off/on amiodarone  VT w/ATP Jan 2023 >> amio restarted Cath with CAD > medical management VT Feb 2024 treated with ATP  Studies Reviewed: Marland Kitchen    EKG done tdoay and reviewed by myself SR/V paced 73bpm, LAD Similar to prior EKGs  DEVICE interrogation done today and reviewed by myself Battery and lead measurements are good No recurrent VT 2/14/episode reviewed, slow,  appropriately treated MMVT with one spin of ATP   11/01/22 TTE IMPRESSIONS  1. Left ventricular ejection fraction, by estimation, is 40 to 45%. Left  ventricular ejection fraction by PLAX is 42 %. The left ventricle has  mildly decreased function. The left ventricle demonstrates regional wall  motion abnormalities (see scoring  diagram/findings for description). The left ventricular internal cavity  size was severely dilated. Left ventricular diastolic parameters are  consistent with Grade I diastolic dysfunction (impaired relaxation). There  is severe hypokinesis of the left  ventricular, basal-mid inferior wall.   2. Right ventricular systolic function is mildly reduced. The right  ventricular size is normal.   3. Left atrial size was mildly dilated.   4. The mitral valve is grossly normal. Trivial mitral valve  regurgitation.   5. The aortic valve is tricuspid. Aortic valve regurgitation is not  visualized.   6. Aortic dilatation noted. There is mild dilatation of the aortic root,  measuring 41 mm.   Comparison(s): No significant change from prior study. 01/06/2022: LVEF  40-45%.    01/07/2022: LHC Occluded vein graft to the right coronary.  Mid occlusion of the right coronary native vessel. Total occlusion of the mid LAD and first diagonal. Widely patent circumflex artery.  Collaterals to the distal RCA are noted Widely patent left main. Normal LVEDP at 11 mmHg. Patent LIMA to the LAD with 95% stenosis in the mid LAD retrograde/proximal to the internal mammary insertion site leaving the mid LAD segment  potentially ischemic.  This is not reachable and not a technically feasible site for PCI due to angulation.   Risk Assessment/Calculations:    Physical Exam:   VS:  There were no vitals taken for this visit.   Wt Readings from Last 3 Encounters:  02/07/24 261 lb (118.4 kg)  12/23/23 255 lb (115.7 kg)  11/10/23 254 lb 6.4 oz (115.4 kg)    GEN: Well nourished, well  developed in no acute distress NECK: No JVD; No carotid bruits CARDIAC: RRR, no murmurs, rubs, gallops RESPIRATORY:  CTA b/l without rales, wheezing or rhonchi  ABDOMEN: Soft, non-tender, non-distended EXTREMITIES:  No edema; No deformity   ICD site: is stable, no thinning, fluctuation, tethering  ASSESSMENT AND PLAN: .    ICD Intact function no programming changes made   CAD No CP, or anginal symptoms MMVT does not suggest ischemic driven VT is known for him, had VT in 2023 > cath then > medical management C/w Dr. Kris Mouton   Chronic CHF ICM No symptoms or exam findings of volume OL C/w Dr. Kris Mouton  6.  VT Chronic Amiodarone This is the 1st VT in 2 years, slow, hemodynamically tolerated and effectively treated with ATP Will hold off on more amio for now Check mag level Labs recently done reviewed They are aware no driving 82mo, he did not have syncope, though did get ATP so I think the restriction applies     Dispo: back in 6-8 weeks, sooner if needed  Signed, Sheilah Pigeon, PA-C

## 2024-02-24 ENCOUNTER — Ambulatory Visit: Payer: Medicare HMO | Attending: Physician Assistant | Admitting: Physician Assistant

## 2024-02-24 ENCOUNTER — Encounter: Payer: Self-pay | Admitting: Physician Assistant

## 2024-02-24 VITALS — BP 102/66 | HR 71 | Ht 75.0 in | Wt 257.2 lb

## 2024-02-24 DIAGNOSIS — I2510 Atherosclerotic heart disease of native coronary artery without angina pectoris: Secondary | ICD-10-CM

## 2024-02-24 DIAGNOSIS — I472 Ventricular tachycardia, unspecified: Secondary | ICD-10-CM

## 2024-02-24 DIAGNOSIS — Z95810 Presence of automatic (implantable) cardiac defibrillator: Secondary | ICD-10-CM | POA: Diagnosis not present

## 2024-02-24 DIAGNOSIS — Z79899 Other long term (current) drug therapy: Secondary | ICD-10-CM | POA: Diagnosis not present

## 2024-02-24 DIAGNOSIS — I10 Essential (primary) hypertension: Secondary | ICD-10-CM | POA: Diagnosis not present

## 2024-02-24 DIAGNOSIS — I5022 Chronic systolic (congestive) heart failure: Secondary | ICD-10-CM

## 2024-02-24 DIAGNOSIS — I255 Ischemic cardiomyopathy: Secondary | ICD-10-CM | POA: Diagnosis not present

## 2024-02-24 LAB — CUP PACEART INCLINIC DEVICE CHECK
Date Time Interrogation Session: 20250228130554
HighPow Impedance: 57 Ohm
HighPow Impedance: 68 Ohm
Implantable Lead Connection Status: 753985
Implantable Lead Connection Status: 753985
Implantable Lead Connection Status: 753985
Implantable Lead Implant Date: 20181022
Implantable Lead Implant Date: 20181022
Implantable Lead Implant Date: 20181022
Implantable Lead Location: 753858
Implantable Lead Location: 753859
Implantable Lead Location: 753860
Implantable Lead Model: 293
Implantable Lead Model: 4671
Implantable Lead Model: 7741
Implantable Lead Serial Number: 423377
Implantable Lead Serial Number: 809750
Implantable Lead Serial Number: 954460
Implantable Pulse Generator Implant Date: 20181022
Lead Channel Impedance Value: 434 Ohm
Lead Channel Impedance Value: 655 Ohm
Lead Channel Impedance Value: 876 Ohm
Lead Channel Pacing Threshold Amplitude: 0.8 V
Lead Channel Pacing Threshold Amplitude: 0.9 V
Lead Channel Pacing Threshold Amplitude: 1.4 V
Lead Channel Pacing Threshold Pulse Width: 0.4 ms
Lead Channel Pacing Threshold Pulse Width: 0.4 ms
Lead Channel Pacing Threshold Pulse Width: 0.4 ms
Lead Channel Sensing Intrinsic Amplitude: 13 mV
Lead Channel Sensing Intrinsic Amplitude: 21.1 mV
Lead Channel Sensing Intrinsic Amplitude: 6.8 mV
Lead Channel Setting Pacing Amplitude: 2 V
Lead Channel Setting Pacing Amplitude: 2 V
Lead Channel Setting Pacing Amplitude: 2.6 V
Lead Channel Setting Pacing Pulse Width: 0.4 ms
Lead Channel Setting Pacing Pulse Width: 0.4 ms
Lead Channel Setting Sensing Sensitivity: 0.6 mV
Lead Channel Setting Sensing Sensitivity: 1 mV
Pulse Gen Serial Number: 181086

## 2024-02-24 NOTE — Patient Instructions (Addendum)
 Medication Instructions:   Your physician recommends that you continue on your current medications as directed. Please refer to the Current Medication list given to you today.   *If you need a refill on your cardiac medications before your next appointment, please call your pharmacy*   Lab Work:   PLEASE GO DOWN STAIRS  [facility]  FIRST FLOOR  [address] ( GET OFF ELEVATORS MAKE A LEFT AND ANOTHER LEFT LAB ON RIGHT DOWN HALL[address]AY :  [place]  TODAY     If you have labs (blood work) drawn today and your tests are completely normal, you will receive your results only by: MyChart Message (if you have MyChart) OR A paper copy in the mail If you have any lab test that is abnormal or we need to change your treatment, we will call you to review the results.   Testing/Procedures: NONE ORDERED  TODAY     Follow-Up: At [facility], you and your health needs are our priority.  As part of our continuing mission to provide you with exceptional heart care, we have created designated Provider Care Teams.  These Care Teams include your primary Cardiologist (physician) and Advanced Practice Providers (APPs -  Physician Assistants and Nurse Practitioners) who all work together to provide you with the care you need, when you need it.  We recommend signing up for the patient portal called "MyChart".  Sign up information is provided on this After Visit Summary.  MyChart is used to connect with patients for Virtual Visits (Telemedicine).  Patients are able to view lab/test results, encounter notes, upcoming appointments, etc.  Non-urgent messages can be sent to your provider as well.   To learn more about what you can do with MyChart, go to [email or web address].    Your next appointment:    6 -8  week(s) ( CONTACT  CASSIE HALL/ ANGELINE HAMMER FOR EP SCHEDULING ISSUES )\    Provider:    You may see Graciela Husbands  or one of the following Advanced Practice Providers on your designated Care Team:   Francis Dowse, [place]  Other Instructions        1st Floor: - Lobby - Registration  - Pharmacy  - Lab - Cafe  2nd Floor: - PV Lab - Diagnostic Testing (echo, CT, nuclear med)  3rd Floor: - Vacant  4th Floor: - TCTS (cardiothoracic surgery) - AFib Clinic - Structural Heart Clinic - Vascular Surgery  - Vascular Ultrasound  5th Floor: - HeartCare Cardiology (general and EP) - Clinical Pharmacy for coumadin, hypertension, lipid, weight-loss medications, and med management appointments    Valet parking services will be available as well.

## 2024-02-25 LAB — MAGNESIUM: Magnesium: 2.2 mg/dL (ref 1.6–2.3)

## 2024-02-27 ENCOUNTER — Encounter: Payer: Self-pay | Admitting: Internal Medicine

## 2024-03-07 NOTE — Progress Notes (Signed)
 Remote ICD transmission.

## 2024-03-15 ENCOUNTER — Other Ambulatory Visit: Payer: Self-pay | Admitting: Internal Medicine

## 2024-03-28 DIAGNOSIS — M9904 Segmental and somatic dysfunction of sacral region: Secondary | ICD-10-CM | POA: Diagnosis not present

## 2024-03-28 DIAGNOSIS — M5441 Lumbago with sciatica, right side: Secondary | ICD-10-CM | POA: Diagnosis not present

## 2024-03-28 DIAGNOSIS — M9903 Segmental and somatic dysfunction of lumbar region: Secondary | ICD-10-CM | POA: Diagnosis not present

## 2024-03-28 DIAGNOSIS — M51362 Other intervertebral disc degeneration, lumbar region with discogenic back pain and lower extremity pain: Secondary | ICD-10-CM | POA: Diagnosis not present

## 2024-03-28 DIAGNOSIS — M9902 Segmental and somatic dysfunction of thoracic region: Secondary | ICD-10-CM | POA: Diagnosis not present

## 2024-03-30 ENCOUNTER — Encounter: Payer: Self-pay | Admitting: Internal Medicine

## 2024-03-30 ENCOUNTER — Ambulatory Visit: Payer: Medicare HMO | Attending: Internal Medicine | Admitting: Internal Medicine

## 2024-03-30 VITALS — BP 104/68 | HR 61 | Ht 75.0 in | Wt 249.0 lb

## 2024-03-30 DIAGNOSIS — I255 Ischemic cardiomyopathy: Secondary | ICD-10-CM

## 2024-03-30 DIAGNOSIS — I472 Ventricular tachycardia, unspecified: Secondary | ICD-10-CM | POA: Diagnosis not present

## 2024-03-30 DIAGNOSIS — E039 Hypothyroidism, unspecified: Secondary | ICD-10-CM | POA: Diagnosis not present

## 2024-03-30 DIAGNOSIS — Z95810 Presence of automatic (implantable) cardiac defibrillator: Secondary | ICD-10-CM | POA: Diagnosis not present

## 2024-03-30 DIAGNOSIS — I5022 Chronic systolic (congestive) heart failure: Secondary | ICD-10-CM

## 2024-03-30 DIAGNOSIS — Z79899 Other long term (current) drug therapy: Secondary | ICD-10-CM | POA: Diagnosis not present

## 2024-03-30 LAB — CUP PACEART INCLINIC DEVICE CHECK
Date Time Interrogation Session: 20250404100954
Implantable Lead Connection Status: 753985
Implantable Lead Connection Status: 753985
Implantable Lead Connection Status: 753985
Implantable Lead Implant Date: 20181022
Implantable Lead Implant Date: 20181022
Implantable Lead Implant Date: 20181022
Implantable Lead Location: 753858
Implantable Lead Location: 753859
Implantable Lead Location: 753860
Implantable Lead Model: 293
Implantable Lead Model: 4671
Implantable Lead Model: 7741
Implantable Lead Serial Number: 423377
Implantable Lead Serial Number: 809750
Implantable Lead Serial Number: 954460
Implantable Pulse Generator Implant Date: 20181022
Pulse Gen Serial Number: 181086

## 2024-03-30 MED ORDER — AMIODARONE HCL 200 MG PO TABS: 200.0000 mg | ORAL_TABLET | Freq: Every day | ORAL | Status: DC

## 2024-03-30 NOTE — Progress Notes (Signed)
 Pelvic Bryan Miller we are stopping her aspirin he said no changes     Patient Care Team: Bryan Ohara, MD as PCP - General (Family Medicine) Duke Salvia, MD as Consulting Physician (Cardiology)   HPI  Bryan Miller is a 73 y.o. male seen in follow-up for ventricular tachycardia in the context of ischemic heart disease and prior bypass surgery with remotely implanted CRT-D [facility]   Amio started and ranolazine   The patient denies chest pain, shortness of breath, nocturnal dyspnea, orthopnea or peripheral edema.  There have been no palpitations, lightheadedness or syncope.  Complains of significant fatigue during which she had an episode of ventricular tachycardia treated with ATP.  Associated with significant family stress.  Patient denies symptoms of respiratory, GI intolerance, sun sensitivity, neurological symptoms attributable to amiodarone.        VT recurrent 2/25  Rx ATP  Losartan was held by Dr. Gennie Alma to 10/24 secondary to low blood pressure  DATE TEST EF   9/21 Echo   35-40 %   1/23 Echo  40-45%   1/23 LHC  LIMA-LAD p; SVG-RCA-100; OM 50%  11/23 Echo  40-45%    Date Cr K Hgb TSH LFTs  2/23 1.08 4.6 14.7 0.116 (levothyroxine dose decreased) 22   9/23 1.12 4.3 15.5 5.9 21  2/24    4.94   8/24 1.19 4.8 16.5 3.17    2/25 1.07 4.4 15.1  12     Records and Results Reviewed   Past Medical History:  Diagnosis Date   CAD (coronary artery disease)    Chronic kidney disease, stage 3a (HCC)    Chronic systolic CHF (congestive heart failure) (HCC)    Dilated aortic root (HCC) 12/05/2018   Hyperlipidemia 08/15/2020   Hypothyroidism    Left bundle branch block 09/14/2019   Old myocardial infarction 06/26/2011   Prediabetes 05/05/2010   Presence of cardiac defibrillator    S/P CABG (coronary artery bypass graft)    Ventricular tachycardia, monomorphic (HCC)     Past Surgical History:  Procedure Laterality Date   ABDOMINAL HERNIA REPAIR  2020   bypass   2001   CHOLECYSTECTOMY  1996   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   LEFT HEART CATH AND CORS/GRAFTS ANGIOGRAPHY N/A 01/07/2022   Procedure: LEFT HEART CATH AND CORS/GRAFTS ANGIOGRAPHY;  Surgeon: Lyn Records, MD;  Location: [facility];  Service: Cardiovascular;  Laterality: N/A;    Current Meds  Medication Sig   acetaminophen (TYLENOL) 500 MG tablet Take 500 mg by mouth at bedtime.   albuterol (VENTOLIN HFA) 108 (90 Base) MCG/ACT inhaler Inhale 1-2 puffs into the lungs every 6 (six) hours as needed.   amiodarone (PACERONE) 200 MG tablet TAKE 1 TABLET(200 MG) BY MOUTH DAILY (Patient taking differently: TAKE 1 TABLET(200 MG) BY MOUTH MONDAY - FRIDAY)   aspirin EC 81 MG tablet Take 81 mg by mouth daily.   bethanechol (URECHOLINE) 25 MG tablet Take 25 mg by mouth 2 (two) times daily.   empagliflozin (JARDIANCE) 10 MG TABS tablet Take 1 tablet (10 mg total) by mouth daily before breakfast.   Evolocumab (REPATHA SURECLICK) 140 MG/ML SOAJ Inject 140 mg into the skin every 14 (fourteen) days.   fluticasone (FLONASE) 50 MCG/ACT nasal spray Place 2 sprays into both nostrils daily. (Patient taking differently: Place 2 sprays into both nostrils as needed.)   furosemide (LASIX) 20 MG tablet Take 1 tablet (20 mg total) by mouth daily.   ipratropium (ATROVENT)  0.03 % nasal spray Place 2 sprays into both nostrils every 12 (twelve) hours.   levothyroxine (SYNTHROID) 88 MCG tablet Take 1 tablet (88 mcg total) by mouth daily before breakfast.   loratadine (CLARITIN) 10 MG tablet Take 10 mg by mouth daily as needed for allergies.   metoprolol succinate (TOPROL-XL) 25 MG 24 hr tablet TAKE 1 TABLET(25 MG) BY MOUTH AT BEDTIME   nitroGLYCERIN (NITROSTAT) 0.4 MG SL tablet Place 1 tablet (0.4 mg total) under the tongue every 5 (five) minutes as needed for chest pain.   OVER THE COUNTER MEDICATION Place 1 spray into both nostrils as needed (nasal congestion). Xytiol   ranolazine (RANEXA) 500 MG 12 hr  tablet TAKE 1 TABLET BY MOUTH TWICE DAILY   spironolactone (ALDACTONE) 25 MG tablet TAKE 1/2 TABLET BY MOUTH EVERY NIGHT AT BEDTIME    No Known Allergies    Review of Systems negative except from HPI and PMH  Physical Exam BP 104/68   Pulse 61   Ht 6\' 3"  (1.905 m)   Wt 249 lb (112.9 kg)   SpO2 97%   BMI 31.12 kg/m    Well developed and well nourished in no acute distress HENT normal Neck supple with JVP-flat Clear Device pocket well healed; without hematoma or erythema.  There is no tethering  Regular rate and rhythm, no murmur Abd-soft with active BS No Clubbing cyanosis  edema Skin-warm and dry A & Oriented  Grossly normal sensory and motor function  ECG sinus P-synchronous/ AV  pacing 12/12/49 Neg QRS lead 1 upright QRS V1  Device function is  normal.  Programming changes AV delay sensed was reprogrammed from 100--90 ms as there was loss of ventricular pacing at higher heart rates See Paceart for details    CrCl cannot be calculated (Patient's most recent lab result is older than the maximum 21 days allowed.).   Assessment and  Plan  Ventricular tachycardia-monomorphic-recurrent treated with ATP   Presyncope with the above   Coronary artery disease with prior bypass   Aborted cardiac arrest   CRT-D-[employer]    Systolic heart failure-chronic-systolic  High Risk Medication Surveillance-amiodarone   Recurrent intercurrent VT treated with ATP.  Will increase amiodarone from 5--7 days a week.  Also given his young age, will reach out to colleagues regarding VT ablation they will be looking into referral sites appropriate to their insurance  Otherwise euvolemic.  Has not tolerated adjunctive ACE/ARB because of low blood pressure.  Continue metoprolol and Aldactone.

## 2024-03-30 NOTE — Patient Instructions (Signed)
 Medication Instructions:  Your physician has recommended you make the following change in your medication:   ** Increase Amiodarone 200mg  to 1 tablet by mouth daily  *If you need a refill on your cardiac medications before your next appointment, please call your pharmacy*  Lab Work: TSH today If you have labs (blood work) drawn today and your tests are completely normal, you will receive your results only by: MyChart Message (if you have MyChart) OR A paper copy in the mail If you have any lab test that is abnormal or we need to change your treatment, we will call you to review the results.  Testing/Procedures: None ordered.   Follow-Up: At [facility], you and your health needs are our priority.  As part of our continuing mission to provide you with exceptional heart care, our providers are all part of one team.  This team includes your primary Cardiologist (physician) and Advanced Practice Providers or APPs (Physician Assistants and Nurse Practitioners) who all work together to provide you with the care you need, when you need it.  Your next appointment:   6 months with Bryan Dowse, PA-C      1st Floor: - Lobby - Registration  - Pharmacy  - Lab - Cafe  2nd Floor: - PV Lab - Diagnostic Testing (echo, CT, nuclear med)  3rd Floor: - Vacant  4th Floor: - TCTS (cardiothoracic surgery) - AFib Clinic - Structural Heart Clinic - Vascular Surgery  - Vascular Ultrasound  5th Floor: - HeartCare Cardiology (general and EP) - Clinical Pharmacy for coumadin, hypertension, lipid, weight-loss medications, and med management appointments    Valet parking services will be available as well.

## 2024-04-02 ENCOUNTER — Ambulatory Visit: Payer: Medicare HMO | Admitting: Physician Assistant

## 2024-04-19 ENCOUNTER — Encounter: Payer: Self-pay | Admitting: Internal Medicine

## 2024-04-19 DIAGNOSIS — W19XXXA Unspecified fall, initial encounter: Secondary | ICD-10-CM | POA: Diagnosis not present

## 2024-04-19 DIAGNOSIS — R079 Chest pain, unspecified: Secondary | ICD-10-CM | POA: Diagnosis not present

## 2024-04-19 DIAGNOSIS — I472 Ventricular tachycardia, unspecified: Secondary | ICD-10-CM | POA: Diagnosis not present

## 2024-04-19 DIAGNOSIS — R55 Syncope and collapse: Secondary | ICD-10-CM | POA: Diagnosis not present

## 2024-04-20 ENCOUNTER — Telehealth: Payer: Self-pay

## 2024-04-20 ENCOUNTER — Encounter: Payer: Self-pay | Admitting: Internal Medicine

## 2024-04-20 ENCOUNTER — Other Ambulatory Visit (HOSPITAL_COMMUNITY): Payer: Self-pay

## 2024-04-20 DIAGNOSIS — I472 Ventricular tachycardia, unspecified: Secondary | ICD-10-CM | POA: Diagnosis not present

## 2024-04-20 DIAGNOSIS — R55 Syncope and collapse: Secondary | ICD-10-CM | POA: Diagnosis not present

## 2024-04-20 MED ORDER — MEXILETINE HCL 250 MG PO CAPS: 250.0000 mg | ORAL_CAPSULE | Freq: Two times a day (BID) | ORAL | 3 refills | Status: DC

## 2024-04-20 NOTE — Telephone Encounter (Signed)
 They were calling 3 separate doctors at different facilities to see which insurance would cover him for VT ablation. [facility] was the provider that would be covered. Doesn't know the name; I'm thinking maybe [facility]? Per WC patient to start mexitil 250mg  BID. Sent Rx to pharmacy. Calling [facility] for lab work results.

## 2024-04-20 NOTE — Telephone Encounter (Signed)
 Spoke to wife, aware looking into [facility] referral and will let them know.  She appreciates they update.

## 2024-04-20 NOTE — Telephone Encounter (Signed)
 Pharmacy Patient Advocate Encounter  Received notification from [employer] that Prior Authorization for MEXILETINE has been APPROVED from 12/28/23 to 12/26/24. Ran test claim, Copay is $17.15. This test claim was processed through [facility]- copay amounts may vary at other pharmacies due to pharmacy/plan contracts, or as the patient moves through the different stages of their insurance plan.

## 2024-04-20 NOTE — Telephone Encounter (Signed)
 Pharmacy Patient Advocate Encounter   Received notification from Onbase that prior authorization for MEXILETINE is required/requested.   Insurance verification completed.   The patient is insured through [employer] .   Per test claim: PA required; PA submitted to above mentioned insurance via CoverMyMeds Key/confirmation #/EOC [id number] Status is pending

## 2024-04-20 NOTE — Telephone Encounter (Signed)
 See separate my chart encounter with response details.  This is a duplicate encounter.

## 2024-04-20 NOTE — Telephone Encounter (Signed)
 Transmission reviewed. Prolonged VT episode. ATP x 4. Pts spouse reports patient was unresponsive. Was doing a lot of yard work. Hit his head around 6pm at a door frame. Was in his office at his desk. Wife heard him very SOB, slumped over for a few seconds, and sat up and said "I'm back." Compliant with meds. Did xray, EKG, UA and head CT. 4/4 Amio was increased from 200mg  daily 5x week to 200mg  7x week. Rodolfo Clan on 4/4 said plan is to reach out for VT ablation referral.

## 2024-04-20 NOTE — Telephone Encounter (Signed)
 Pt spouse called in to report pt had an episode where he was unresponsive last night around 10:15 pm.  Pt went to [facility].   Pt reports event felt very similar to one previously had about 6 years ago.  Pt reports he died and came back.  Pt has ICD. Spouse would like pt device checked to see what occurred during this time.  Advised will send message to [facility] to f/u.  Spouse would like a call back ASAP.

## 2024-04-20 NOTE — Telephone Encounter (Signed)
  The pt wife Amalia Badder states [employer] will cover the mexilil 55%. The will have to pay $40 for a 90 day supply. They will need a prior authorization first. So she asked that you send the insurance a prior authorization and let her know when it is done.

## 2024-04-23 ENCOUNTER — Telehealth: Payer: Self-pay

## 2024-04-23 ENCOUNTER — Ambulatory Visit (INDEPENDENT_AMBULATORY_CARE_PROVIDER_SITE_OTHER): Payer: Medicare HMO

## 2024-04-23 DIAGNOSIS — I5022 Chronic systolic (congestive) heart failure: Secondary | ICD-10-CM | POA: Diagnosis not present

## 2024-04-23 DIAGNOSIS — I472 Ventricular tachycardia, unspecified: Secondary | ICD-10-CM | POA: Diagnosis not present

## 2024-04-23 DIAGNOSIS — E039 Hypothyroidism, unspecified: Secondary | ICD-10-CM | POA: Diagnosis not present

## 2024-04-23 DIAGNOSIS — Z79899 Other long term (current) drug therapy: Secondary | ICD-10-CM | POA: Diagnosis not present

## 2024-04-23 LAB — CUP PACEART REMOTE DEVICE CHECK
Battery Remaining Longevity: 60 mo
Battery Remaining Percentage: 81 %
Brady Statistic RA Percent Paced: 7 %
Brady Statistic RV Percent Paced: 98 %
Date Time Interrogation Session: 20250424215600
HighPow Impedance: 62 Ohm
Implantable Lead Connection Status: 753985
Implantable Lead Connection Status: 753985
Implantable Lead Connection Status: 753985
Implantable Lead Implant Date: 20181022
Implantable Lead Implant Date: 20181022
Implantable Lead Implant Date: 20181022
Implantable Lead Location: 753858
Implantable Lead Location: 753859
Implantable Lead Location: 753860
Implantable Lead Model: 293
Implantable Lead Model: 4671
Implantable Lead Model: 7741
Implantable Lead Serial Number: 423377
Implantable Lead Serial Number: 809750
Implantable Lead Serial Number: 954460
Implantable Pulse Generator Implant Date: 20181022
Lead Channel Impedance Value: 406 Ohm
Lead Channel Impedance Value: 570 Ohm
Lead Channel Impedance Value: 803 Ohm
Lead Channel Pacing Threshold Amplitude: 0.8 V
Lead Channel Pacing Threshold Amplitude: 0.9 V
Lead Channel Pacing Threshold Amplitude: 1.4 V
Lead Channel Pacing Threshold Pulse Width: 0.4 ms
Lead Channel Pacing Threshold Pulse Width: 0.4 ms
Lead Channel Pacing Threshold Pulse Width: 0.4 ms
Lead Channel Setting Pacing Amplitude: 2 V
Lead Channel Setting Pacing Amplitude: 2 V
Lead Channel Setting Pacing Amplitude: 2.6 V
Lead Channel Setting Pacing Pulse Width: 0.4 ms
Lead Channel Setting Pacing Pulse Width: 0.4 ms
Lead Channel Setting Sensing Sensitivity: 0.6 mV
Lead Channel Setting Sensing Sensitivity: 1 mV
Pulse Gen Serial Number: 181086

## 2024-04-23 NOTE — Transitions of Care (Post Inpatient/ED Visit) (Signed)
 04/23/2024  Name: Bryan Miller MRN: [id number] DOB: 1951/04/08  Today's TOC FU Call Status: Today's TOC FU Call Status:: Successful TOC FU Call Completed TOC FU Call Complete Date: 04/23/24 Patient's Name and Date of Birth confirmed.  Transition Care Management Follow-up Telephone Call Date of Discharge: 04/20/24 Discharge Facility: Other ([facility]) Name of Other ([facility]) Discharge Facility: [facility] Type of Discharge: Emergency Department Reason for ED Visit: Other: (fall, syncope) How have you been since you were released from the hospital?: Better Any questions or concerns?: No  Items Reviewed: Did you receive and understand the discharge instructions provided?: Yes Medications obtained,verified, and reconciled?: Yes (Medications Reviewed) Any new allergies since your discharge?: No Dietary orders reviewed?: Yes Do you have support at home?: Yes People in Home [RPT]: spouse  Medications Reviewed Today: Medications Reviewed Today     Reviewed by Darrall Ellison, LPN (Licensed Practical Nurse) on 04/23/24 at 1555  Med List Status: <None>   Medication Order Taking? Sig Documenting Provider Last Dose Status Informant  acetaminophen  (TYLENOL ) 500 MG tablet [id number] No Take 500 mg by mouth at bedtime. [provider] Taking Active   albuterol  (VENTOLIN  HFA) 108 (90 Base) MCG/ACT inhaler [id number] No Inhale 1-2 puffs into the lungs every 6 (six) hours as needed. Angelia Kelp, PA-C Taking Active   amiodarone  (PACERONE ) 200 MG tablet [id number]  Take 1 tablet (200 mg total) by mouth daily. Verona Goodwill, MD  Active   aspirin  EC 81 MG tablet [id number] No Take 81 mg by mouth daily. [provider] Taking Active   bethanechol (URECHOLINE) 25 MG tablet [id number] No Take 25 mg by mouth 2 (two) times daily. [provider] Taking Active   empagliflozin  (JARDIANCE ) 10 MG TABS tablet [id number] No Take 1 tablet (10 mg total) by mouth daily  before breakfast. Leala Prince, PA-C Taking Active   Evolocumab  (REPATHA  SURECLICK) 140 MG/ML SOAJ [id number] No Inject 140 mg into the skin every 14 (fourteen) days. Leala Prince, PA-C Taking Active   fluticasone  (FLONASE ) 50 MCG/ACT nasal spray [id number] No Place 2 sprays into both nostrils daily.  Patient taking differently: Place 2 sprays into both nostrils as needed.   Cox, Kirsten, MD Taking Active   furosemide  (LASIX ) 20 MG tablet [id number] No Take 1 tablet (20 mg total) by mouth daily. Thukkani, Arun K, MD Taking Active   ipratropium (ATROVENT ) 0.03 % nasal spray [id number] No Place 2 sprays into both nostrils every 12 (twelve) hours. Nellie Banas M, PA-C Taking Active   levothyroxine  (SYNTHROID ) 88 MCG tablet [id number] No Take 1 tablet (88 mcg total) by mouth daily before breakfast. Cox, Kirsten, MD Taking Active   loratadine (CLARITIN) 10 MG tablet [id number] No Take 10 mg by mouth daily as needed for allergies. [provider] Taking Active Multiple Informants  metoprolol  succinate (TOPROL -XL) 25 MG 24 hr tablet [id number] No TAKE 1 TABLET(25 MG) BY MOUTH AT BEDTIME Cox, Kirsten, MD Taking Active   mexiletine (MEXITIL) 250 MG capsule [id number]  Take 1 capsule (250 mg total) by mouth 2 (two) times daily. Lei Pump, MD  Active   nitroGLYCERIN  (NITROSTAT ) 0.4 MG SL tablet [id number] No Place 1 tablet (0.4 mg total) under the tongue every 5 (five) minutes as needed for chest pain. Mercy Stall, MD Taking Active   OVER THE COUNTER MEDICATION [id number] No Place 1 spray into both nostrils as needed (nasal congestion). Xytiol [provider] Taking Active Multiple Informants  ranolazine  (RANEXA ) 500 MG 12 hr tablet [id number]  No TAKE 1 TABLET BY MOUTH TWICE DAILY Hilty, Aviva Lemmings, MD Taking Active   spironolactone  (ALDACTONE ) 25 MG tablet [id number] No TAKE 1/2 TABLET BY MOUTH EVERY NIGHT AT BEDTIME Cox, Kirsten, MD Taking Active             Home Care and  Equipment/Supplies: Were Home Health Services Ordered?: NA Any new equipment or medical supplies ordered?: NA  Functional Questionnaire: Do you need assistance with bathing/showering or dressing?: No Do you need assistance with meal preparation?: No Do you need assistance with eating?: No Do you have difficulty maintaining continence: No Do you need assistance with getting out of bed/getting out of a chair/moving?: No Do you have difficulty managing or taking your medications?: No  Follow up appointments reviewed: PCP Follow-up appointment confirmed?: NA Specialist Hospital Follow-up appointment confirmed?: No Reason Specialist Follow-Up Not Confirmed: Patient has Specialist Provider Number and will Call for Appointment Do you need transportation to your follow-up appointment?: No Do you understand care options if your condition(s) worsen?: Yes-patient verbalized understanding    SIGNATURE Darrall Ellison, LPN [facility] Nurse Health Advisor Direct Dial [phone]

## 2024-04-24 LAB — TSH: TSH: 2.72 u[IU]/mL (ref 0.450–4.500)

## 2024-04-25 ENCOUNTER — Other Ambulatory Visit: Payer: Self-pay | Admitting: Family Medicine

## 2024-04-25 ENCOUNTER — Encounter: Payer: Self-pay | Admitting: Internal Medicine

## 2024-04-25 DIAGNOSIS — I4729 Other ventricular tachycardia: Secondary | ICD-10-CM

## 2024-04-26 ENCOUNTER — Telehealth: Payer: Self-pay | Admitting: Internal Medicine

## 2024-04-26 DIAGNOSIS — N390 Urinary tract infection, site not specified: Secondary | ICD-10-CM | POA: Diagnosis not present

## 2024-04-26 DIAGNOSIS — N401 Enlarged prostate with lower urinary tract symptoms: Secondary | ICD-10-CM | POA: Diagnosis not present

## 2024-04-26 DIAGNOSIS — R339 Retention of urine, unspecified: Secondary | ICD-10-CM | POA: Diagnosis not present

## 2024-04-26 NOTE — Telephone Encounter (Signed)
 Spoke with pt and advised referral will be placed to Dr Maralee Senate with [facility] to discuss VT ablation per Michaelle Adolphus, PA-C.  Pt verbalizes understanding and thanked Charity fundraiser for the call.

## 2024-04-26 NOTE — Telephone Encounter (Signed)
 Pt would like a c/b in regards to Mychart message. Please advise

## 2024-04-26 NOTE — Addendum Note (Signed)
 Addended by: Arva Lathe on: 04/26/2024 04:27 PM   Modules accepted: Orders

## 2024-04-30 ENCOUNTER — Other Ambulatory Visit: Payer: Self-pay | Admitting: Family Medicine

## 2024-04-30 MED ORDER — ATORVASTATIN CALCIUM 40 MG PO TABS: 40.0000 mg | ORAL_TABLET | Freq: Every day | ORAL | 3 refills | Status: AC

## 2024-04-30 NOTE — Telephone Encounter (Signed)
 Copied from CRM [id number]. Topic: Clinical - Medication Refill >> Apr 30, 2024 12:56 PM Tiffany S wrote: Most Recent Primary Care Visit:  Provider: [facility]  Department[facility]  Visit Type: CLINICAL SUPPORT  Date: 02/22/2024  Medication: Atrovanstatin 40mg   Has the patient contacted their pharmacy? Yes (Agent: If no, request that the patient contact the pharmacy for the refill. If patient does not wish to contact the pharmacy document the reason why and proceed with request.) (Agent: If yes, when and what did the pharmacy advise?)  Is this the correct pharmacy for this prescription? Yes If no, delete pharmacy and type the correct one.  This is the patient's preferred pharmacy:  [id number], [place] - [address] [place] [address] Phone: [phone] Fax: [phone]    Has the prescription been filled recently? Yes  Is the patient out of the medication? Yes  Has the patient been seen for an appointment in the last year OR does the patient have an upcoming appointment? Yes  Can we respond through MyChart? Yes  Agent: Please be advised that Rx refills may take up to 3 business days. We ask that you follow-up with your pharmacy.

## 2024-04-30 NOTE — Telephone Encounter (Signed)
 Pt advised that I made sure the refarrl has been placed and it has.... he can expect a call from:   Dr Gearl Keens [facility] to discuss VT ablation  They will call him to make his appt.

## 2024-04-30 NOTE — Telephone Encounter (Signed)
 Patient was returning call. Please advise ?

## 2024-05-08 ENCOUNTER — Ambulatory Visit: Payer: Medicare HMO | Admitting: Family Medicine

## 2024-05-08 ENCOUNTER — Encounter: Payer: Self-pay | Admitting: Family Medicine

## 2024-05-08 VITALS — BP 110/70 | HR 64 | Temp 97.8°F | Ht 75.0 in | Wt 252.0 lb

## 2024-05-08 DIAGNOSIS — R7303 Prediabetes: Secondary | ICD-10-CM | POA: Diagnosis not present

## 2024-05-08 DIAGNOSIS — I472 Ventricular tachycardia, unspecified: Secondary | ICD-10-CM

## 2024-05-08 DIAGNOSIS — N1831 Chronic kidney disease, stage 3a: Secondary | ICD-10-CM

## 2024-05-08 DIAGNOSIS — E66811 Obesity, class 1: Secondary | ICD-10-CM

## 2024-05-08 DIAGNOSIS — E6609 Other obesity due to excess calories: Secondary | ICD-10-CM

## 2024-05-08 DIAGNOSIS — E038 Other specified hypothyroidism: Secondary | ICD-10-CM | POA: Diagnosis not present

## 2024-05-08 DIAGNOSIS — I25119 Atherosclerotic heart disease of native coronary artery with unspecified angina pectoris: Secondary | ICD-10-CM | POA: Diagnosis not present

## 2024-05-08 DIAGNOSIS — Z95810 Presence of automatic (implantable) cardiac defibrillator: Secondary | ICD-10-CM

## 2024-05-08 DIAGNOSIS — E782 Mixed hyperlipidemia: Secondary | ICD-10-CM | POA: Diagnosis not present

## 2024-05-08 DIAGNOSIS — K219 Gastro-esophageal reflux disease without esophagitis: Secondary | ICD-10-CM

## 2024-05-08 DIAGNOSIS — E039 Hypothyroidism, unspecified: Secondary | ICD-10-CM

## 2024-05-08 DIAGNOSIS — I5022 Chronic systolic (congestive) heart failure: Secondary | ICD-10-CM

## 2024-05-08 DIAGNOSIS — Z6833 Body mass index (BMI) 33.0-33.9, adult: Secondary | ICD-10-CM

## 2024-05-08 MED ORDER — LEVOTHYROXINE SODIUM 88 MCG PO TABS: 88.0000 ug | ORAL_TABLET | Freq: Every day | ORAL | 3 refills | Status: AC

## 2024-05-08 MED ORDER — METOPROLOL SUCCINATE ER 25 MG PO TB24: 25.0000 mg | ORAL_TABLET | Freq: Every day | ORAL | 1 refills | Status: DC

## 2024-05-08 MED ORDER — FAMOTIDINE 20 MG PO TABS: 20.0000 mg | ORAL_TABLET | Freq: Two times a day (BID) | ORAL | 2 refills | Status: AC

## 2024-05-08 NOTE — Progress Notes (Unsigned)
 Subjective:  Patient ID: Bryan Miller, male    DOB: May 10, 1951  Age: 73 y.o. MRN: [id number]  Chief Complaint  Patient presents with   Medical Management of Chronic Issues    HPI: Hyperlipidemia: On atorvastatin  40 mg before bed. Maintains low cholesterol diet and exercise regimen.     Hypothyroidism: Taking Levothyroxine  88 mcg daily.   Prediabetes: Taking Jardiance  10 mg daily. Just recently started taking due to be approved for PAP.   CHF: He takes spironolactone  25 mg 1/2 tablet daily, furosemide  20 mg daily, and jardiance  10 mg daily    CORONARY ARTERY DISEASE: on ranolazine  500 mg twice daily, toprol  xl 25 mg daily, repatha , aspirin , ntg.  Taking repatha  every 2 weeks. Just recently started taking due to be approved for PAP.   Ventricular tachycardia: Currently on amiodarone  200 mg daily, metoprolol  xl 25 mg once daily, and mexitil 250 mg twice daily. Changes because his defibrillator went off twice in the last 3 months.Scheduled to see Dr. Rodney Clamp to have a ventricular ablation. Next appt 08/13/2024.  Will be able to start driving in October. He has the summer off from teaching. His wife is driving him around.     05/08/2024    3:10 PM 02/07/2024    3:20 PM 08/09/2023    9:21 AM 08/04/2023    8:43 AM 01/27/2023    3:54 PM  Depression screen PHQ 2/9  Decreased Interest 0 0 0 0 0  Down, Depressed, Hopeless 0 0 0 0 0  PHQ - 2 Score 0 0 0 0 0  Altered sleeping 0  0 0   Tired, decreased energy 0  0 1   Change in appetite 0  0 0   Feeling bad or failure about yourself  0  0 0   Trouble concentrating 0  0 0   Moving slowly or fidgety/restless 0  0 0   Suicidal thoughts 0  0 0   PHQ-9 Score 0  0 1   Difficult doing work/chores Not difficult at all  Not difficult at all Not difficult at all         05/08/2024    3:10 PM  Fall Risk   Falls in the past year? 0  Number falls in past yr: 0  Injury with Fall? 0  Risk for fall due to : No Fall Risks    Patient Care  Team: Mercy Stall, MD as PCP - General (Family Medicine) Verona Goodwill, MD as Consulting Physician (Cardiology)   Review of Systems  Constitutional:  Negative for chills, diaphoresis, fatigue and fever.  HENT:  Negative for congestion, ear pain and sore throat.   Respiratory:  Negative for cough and shortness of breath.   Cardiovascular:  Negative for chest pain and leg swelling.  Gastrointestinal:  Negative for abdominal pain, constipation, diarrhea, nausea and vomiting.  Genitourinary:  Negative for dysuria and urgency.  Musculoskeletal:  Negative for arthralgias and myalgias.  Neurological:  Negative for dizziness and headaches.  Psychiatric/Behavioral:  Negative for dysphoric mood.     Current Outpatient Medications on File Prior to Visit  Medication Sig Dispense Refill   acetaminophen  (TYLENOL ) 500 MG tablet Take 500 mg by mouth at bedtime.     albuterol  (VENTOLIN  HFA) 108 (90 Base) MCG/ACT inhaler Inhale 1-2 puffs into the lungs every 6 (six) hours as needed. 8 g 0   amiodarone  (PACERONE ) 200 MG tablet Take 1 tablet (200 mg total) by mouth daily.  aspirin  EC 81 MG tablet Take 81 mg by mouth daily.     atorvastatin  (LIPITOR) 40 MG tablet Take 1 tablet (40 mg total) by mouth daily. 90 tablet 3   bethanechol (URECHOLINE) 25 MG tablet Take 25 mg by mouth 2 (two) times daily.     empagliflozin  (JARDIANCE ) 10 MG TABS tablet Take 1 tablet (10 mg total) by mouth daily before breakfast. 90 tablet 3   Evolocumab  (REPATHA  SURECLICK) 140 MG/ML SOAJ Inject 140 mg into the skin every 14 (fourteen) days. 6 mL 3   fluticasone  (FLONASE ) 50 MCG/ACT nasal spray Place 2 sprays into both nostrils daily. (Patient taking differently: Place 2 sprays into both nostrils as needed.) 16 g 6   furosemide  (LASIX ) 20 MG tablet Take 1 tablet (20 mg total) by mouth daily.     ipratropium (ATROVENT ) 0.03 % nasal spray Place 2 sprays into both nostrils every 12 (twelve) hours. 30 mL 0   loratadine (CLARITIN)  10 MG tablet Take 10 mg by mouth daily as needed for allergies.     mexiletine (MEXITIL) 250 MG capsule Take 1 capsule (250 mg total) by mouth 2 (two) times daily. 180 capsule 3   nitroGLYCERIN  (NITROSTAT ) 0.4 MG SL tablet Place 1 tablet (0.4 mg total) under the tongue every 5 (five) minutes as needed for chest pain. 25 tablet 1   OVER THE COUNTER MEDICATION Place 1 spray into both nostrils as needed (nasal congestion). Xytiol     ranolazine  (RANEXA ) 500 MG 12 hr tablet TAKE 1 TABLET BY MOUTH TWICE DAILY 180 tablet 3   spironolactone  (ALDACTONE ) 25 MG tablet TAKE 1/2 TABLET BY MOUTH EVERY NIGHT AT BEDTIME 90 tablet 1   No current facility-administered medications on file prior to visit.   Past Medical History:  Diagnosis Date   CAD (coronary artery disease)    Chronic kidney disease, stage 3a (HCC)    Chronic systolic CHF (congestive heart failure) (HCC)    Dilated aortic root (HCC) 12/05/2018   Hyperlipidemia 08/15/2020   Hypothyroidism    Left bundle branch block 09/14/2019   Old myocardial infarction 06/26/2011   Prediabetes 05/05/2010   Presence of cardiac defibrillator    S/P CABG (coronary artery bypass graft)    Ventricular tachycardia, monomorphic (HCC)    Past Surgical History:  Procedure Laterality Date   ABDOMINAL HERNIA REPAIR  2020   bypass  2001   CHOLECYSTECTOMY  1996   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   LEFT HEART CATH AND CORS/GRAFTS ANGIOGRAPHY N/A 01/07/2022   Procedure: LEFT HEART CATH AND CORS/GRAFTS ANGIOGRAPHY;  Surgeon: Arty Binning, MD;  Location: [facility];  Service: Cardiovascular;  Laterality: N/A;    Family History  Problem Relation Age of Onset   Dementia Mother    Heart Problems Mother    Heart Problems Father    Social History   Socioeconomic History   Marital status: Married    Spouse name: Amalia Badder   Number of children: 3   Years of education: Not on file   Highest education level: Bachelor's degree (e.g., BA, AB, BS)   Occupational History   Not on file  Tobacco Use   Smoking status: Former    Current packs/day: 0.00    Types: Cigarettes    Quit date: 1970    Years since quitting: 55.4   Smokeless tobacco: Never  Vaping Use   Vaping status: Never Used  Substance and Sexual Activity   Alcohol use: Not Currently  Drug use: Never   Sexual activity: Yes    Partners: Female  Other Topics Concern   Not on file  Social History Narrative   Retired - currently Armed forces operational officer at [employer].  Lives with wife, moved back to [place] from [place]  a few if years ago.  Has one son and two daughters.   Social Drivers of Corporate investment banker Strain: Low Risk  (05/05/2024)   Overall Financial Resource Strain (CARDIA)    Difficulty of Paying Living Expenses: Not hard at all  Food Insecurity: No Food Insecurity (05/05/2024)   Hunger Vital Sign    Worried About Running Out of Food in the Last Year: Never true    Ran Out of Food in the Last Year: Never true  Transportation Needs: No Transportation Needs (05/05/2024)   PRAPARE - Administrator, Civil Service (Medical): No    Lack of Transportation (Non-Medical): No  Physical Activity: Insufficiently Active (05/05/2024)   Exercise Vital Sign    Days of Exercise per Week: 4 days    Minutes of Exercise per Session: 10 min  Stress: No Stress Concern Present (05/05/2024)   [employer] of Occupational Health - Occupational Stress Questionnaire    Feeling of Stress : Only a little  Social Connections: Socially Integrated (05/05/2024)   Social Connection and Isolation Panel [NHANES]    Frequency of Communication with Friends and Family: More than three times a week    Frequency of Social Gatherings with Friends and Family: Twice a week    Attends Religious Services: More than 4 times per year    Active Member of [employer] or Organizations: Yes    Attends Engineer, structural: Patient declined    Marital Status: Married    Objective:  BP 110/70    Pulse 64   Temp 97.8 F (36.6 C)   Ht 6\' 3"  (1.905 m)   Wt 252 lb (114.3 kg)   SpO2 94%   BMI 31.50 kg/m      05/08/2024    3:19 PM 03/30/2024    9:37 AM 02/24/2024    7:57 AM  BP/Weight  Systolic BP 110 104 102  Diastolic BP 70 68 66  Wt. (Lbs) 252 249 257.2  BMI 31.5 kg/m2 31.12 kg/m2 32.15 kg/m2    Physical Exam Vitals reviewed.  Constitutional:      Appearance: Normal appearance. He is obese.  Neck:     Vascular: No carotid bruit.  Cardiovascular:     Rate and Rhythm: Normal rate. Rhythm irregular.     Pulses: Normal pulses.     Heart sounds: Normal heart sounds.  Pulmonary:     Effort: Pulmonary effort is normal.     Breath sounds: Normal breath sounds. No wheezing, rhonchi or rales.  Abdominal:     General: Bowel sounds are normal.     Palpations: Abdomen is soft.     Tenderness: There is no abdominal tenderness.  Neurological:     Mental Status: He is alert and oriented to person, place, and time.  Psychiatric:        Mood and Affect: Mood normal.        Behavior: Behavior normal.     Diabetic Foot Exam - Simple   No data filed      Lab Results  Component Value Date   WBC 8.3 05/08/2024   HGB 15.1 05/08/2024   HCT 47.5 05/08/2024   PLT 213 05/08/2024   GLUCOSE 88 05/08/2024  CHOL 110 05/08/2024   TRIG 173 (H) 05/08/2024   HDL 51 05/08/2024   LDLCALC 31 05/08/2024   ALT 21 05/08/2024   AST 22 05/08/2024   NA 143 05/08/2024   K 4.8 05/08/2024   CL 104 05/08/2024   CREATININE 1.51 (H) 05/08/2024   BUN 15 05/08/2024   CO2 25 05/08/2024   TSH 2.720 04/23/2024   HGBA1C 5.6 02/07/2024      Assessment & Plan:  Atherosclerosis of native coronary artery of native heart with angina pectoris (HCC) Assessment & Plan: Continue repatha , ranolazine , toprol  xl and aspirin .  Management per specialist.    Orders: -     Metoprolol  Succinate ER; Take 1 tablet (25 mg total) by mouth daily.  Dispense: 90 tablet; Refill: 1  Chronic systolic heart  failure (HCC) Assessment & Plan: The current medical regimen is effective;  continue present plan and medications.  Takes spironolactone  25 mg 1/2 tablet daily, furosemide  20 mg daily, and jardiance  10 mg daily    Prediabetes Assessment & Plan: Recommend continue to work on eating healthy diet and exercise. Taking Jardiance .   Mixed hyperlipidemia Assessment & Plan: Well controlled.  No changes to medicines. On Repatha . Continue to work on eating a healthy diet and exercise.  Labs drawn today.  Orders: -     Lipid panel  Chronic kidney disease, stage 3a (HCC) Assessment & Plan: Stable  Orders: -     CBC with Differential/Platelet -     Comprehensive metabolic panel with GFR  Class 1 obesity due to excess calories with serious comorbidity and body mass index (BMI) of 33.0 to 33.9 in adult Assessment & Plan: Recommend continue to work on eating healthy diet and exercise.    Ventricular tachycardia [facility]) Assessment & Plan: The current medical regimen is effective;  continue present plan and medications.  Currently on amiodarone  200 mg daily, metoprolol  xl 25 mg once daily, and mexitil 250 mg twice daily.  Management per specialist.   Presence of cardiac defibrillator Assessment & Plan: Management by cardiology.    Subclinical hypothyroidism Assessment & Plan: The current medical regimen is effective;  continue present plan and medications. Continue levothyroxine  88 mcg once daily in am.   Orders: -     Levothyroxine  Sodium; Take 1 tablet (88 mcg total) by mouth daily before breakfast.  Dispense: 90 tablet; Refill: 3  GERD without esophagitis Assessment & Plan: Start on famotidine  20 mg twice daily. Try to take 30 minutes prior to Mexitil.   Orders: -     Famotidine ; Take 1 tablet (20 mg total) by mouth 2 (two) times daily.  Dispense: 60 tablet; Refill: 2     Meds ordered this encounter  Medications   famotidine  (PEPCID ) 20 MG tablet    Sig: Take 1  tablet (20 mg total) by mouth 2 (two) times daily.    Dispense:  60 tablet    Refill:  2   levothyroxine  (SYNTHROID ) 88 MCG tablet    Sig: Take 1 tablet (88 mcg total) by mouth daily before breakfast.    Dispense:  90 tablet    Refill:  3   metoprolol  succinate (TOPROL -XL) 25 MG 24 hr tablet    Sig: Take 1 tablet (25 mg total) by mouth daily.    Dispense:  90 tablet    Refill:  1    Orders Placed This Encounter  Procedures   CBC with Differential/Platelet   Comprehensive metabolic panel with GFR   Lipid panel  Follow-up: Return in about 4 months (around 09/08/2024) for chronic follow up, awv end of 07/2024.   I,Katherina A Bramblett,acting as a scribe for Mercy Stall, MD.,have documented all relevant documentation on the behalf of Mercy Stall, MD,as directed by  Mercy Stall, MD while in the presence of Mercy Stall, MD.   Gladys Lamp I Leal-Borjas,acting as a scribe for Mercy Stall, MD.,have documented all relevant documentation on the behalf of Mercy Stall, MD,as directed by  Mercy Stall, MD while in the presence of Mercy Stall, MD.    An After Visit Summary was printed and given to the patient.  I attest that I have reviewed this visit and agree with the plan scribed by my staff.   Mercy Stall, MD [facility] [phone]

## 2024-05-08 NOTE — Patient Instructions (Addendum)
 Start on famotidine 20 mg twice daily. Try to take 30 minutes prior to Mexitil.

## 2024-05-09 ENCOUNTER — Ambulatory Visit: Payer: Self-pay | Admitting: Family Medicine

## 2024-05-09 LAB — LIPID PANEL
Chol/HDL Ratio: 2.2 ratio (ref 0.0–5.0)
Cholesterol, Total: 110 mg/dL (ref 100–199)
HDL: 51 mg/dL (ref >39)
LDL Chol Calc (NIH): 31 mg/dL (ref 0–99)
Triglycerides: 173 mg/dL — ABNORMAL HIGH (ref 0–149)
VLDL Cholesterol Cal: 28 mg/dL (ref 5–40)

## 2024-05-09 LAB — COMPREHENSIVE METABOLIC PANEL WITH GFR
ALT: 21 IU/L (ref 0–44)
AST: 22 IU/L (ref 0–40)
Albumin: 4 g/dL (ref 3.8–4.8)
Alkaline Phosphatase: 92 IU/L (ref 44–121)
BUN/Creatinine Ratio: 10 (ref 10–24)
BUN: 15 mg/dL (ref 8–27)
Bilirubin Total: 0.6 mg/dL (ref 0.0–1.2)
CO2: 25 mmol/L (ref 20–29)
Calcium: 9.1 mg/dL (ref 8.6–10.2)
Chloride: 104 mmol/L (ref 96–106)
Creatinine, Ser: 1.51 mg/dL — ABNORMAL HIGH (ref 0.76–1.27)
Globulin, Total: 2.5 g/dL (ref 1.5–4.5)
Glucose: 88 mg/dL (ref 70–99)
Potassium: 4.8 mmol/L (ref 3.5–5.2)
Sodium: 143 mmol/L (ref 134–144)
Total Protein: 6.5 g/dL (ref 6.0–8.5)
eGFR: 48 mL/min/{1.73_m2} — ABNORMAL LOW (ref >59)

## 2024-05-09 LAB — CBC WITH DIFFERENTIAL/PLATELET
Basophils Absolute: 0.1 10*3/uL (ref 0.0–0.2)
Basos: 1 %
EOS (ABSOLUTE): 0.5 10*3/uL — ABNORMAL HIGH (ref 0.0–0.4)
Eos: 5 %
Hematocrit: 47.5 % (ref 37.5–51.0)
Hemoglobin: 15.1 g/dL (ref 13.0–17.7)
Immature Grans (Abs): 0 10*3/uL (ref 0.0–0.1)
Immature Granulocytes: 0 %
Lymphocytes Absolute: 2.8 10*3/uL (ref 0.7–3.1)
Lymphs: 34 %
MCH: 31.8 pg (ref 26.6–33.0)
MCHC: 31.8 g/dL (ref 31.5–35.7)
MCV: 100 fL — ABNORMAL HIGH (ref 79–97)
Monocytes Absolute: 0.9 10*3/uL (ref 0.1–0.9)
Monocytes: 11 %
Neutrophils Absolute: 4.1 10*3/uL (ref 1.4–7.0)
Neutrophils: 49 %
Platelets: 213 10*3/uL (ref 150–450)
RBC: 4.75 x10E6/uL (ref 4.14–5.80)
RDW: 12.9 % (ref 11.6–15.4)
WBC: 8.3 10*3/uL (ref 3.4–10.8)

## 2024-05-10 ENCOUNTER — Encounter: Payer: Self-pay | Admitting: Internal Medicine

## 2024-05-10 ENCOUNTER — Other Ambulatory Visit: Payer: Self-pay

## 2024-05-10 DIAGNOSIS — N289 Disorder of kidney and ureter, unspecified: Secondary | ICD-10-CM

## 2024-05-12 DIAGNOSIS — K219 Gastro-esophageal reflux disease without esophagitis: Secondary | ICD-10-CM | POA: Insufficient documentation

## 2024-05-12 NOTE — Assessment & Plan Note (Signed)
 Recommend continue to work on eating healthy diet and exercise.

## 2024-05-12 NOTE — Assessment & Plan Note (Signed)
 Stable

## 2024-05-12 NOTE — Assessment & Plan Note (Signed)
 The current medical regimen is effective;  continue present plan and medications. Continue levothyroxine 88 mcg once daily in am.

## 2024-05-12 NOTE — Assessment & Plan Note (Signed)
Management by cardiology.  

## 2024-05-12 NOTE — Assessment & Plan Note (Signed)
 Well controlled.  No changes to medicines. On Repatha . Continue to work on eating a healthy diet and exercise.  Labs drawn today.

## 2024-05-12 NOTE — Assessment & Plan Note (Signed)
 Continue repatha, ranolazine, toprol xl and aspirin.  Management per specialist.

## 2024-05-12 NOTE — Assessment & Plan Note (Signed)
 Recommend continue to work on eating healthy diet and exercise. Taking Jardiance .

## 2024-05-12 NOTE — Assessment & Plan Note (Signed)
 The current medical regimen is effective;  continue present plan and medications.  Takes spironolactone  25 mg 1/2 tablet daily, furosemide  20 mg daily, and jardiance  10 mg daily

## 2024-05-12 NOTE — Assessment & Plan Note (Signed)
 Start on famotidine 20 mg twice daily. Try to take 30 minutes prior to Mexitil.

## 2024-05-12 NOTE — Assessment & Plan Note (Signed)
 The current medical regimen is effective;  continue present plan and medications.  Currently on amiodarone  200 mg daily, metoprolol  xl 25 mg once daily, and mexitil 250 mg twice daily.  Management per specialist.

## 2024-06-10 ENCOUNTER — Other Ambulatory Visit: Payer: Self-pay

## 2024-06-10 DIAGNOSIS — N289 Disorder of kidney and ureter, unspecified: Secondary | ICD-10-CM

## 2024-06-11 ENCOUNTER — Other Ambulatory Visit

## 2024-06-14 NOTE — Progress Notes (Signed)
 Remote ICD transmission.

## 2024-06-25 DIAGNOSIS — M51362 Other intervertebral disc degeneration, lumbar region with discogenic back pain and lower extremity pain: Secondary | ICD-10-CM | POA: Diagnosis not present

## 2024-06-25 DIAGNOSIS — M9902 Segmental and somatic dysfunction of thoracic region: Secondary | ICD-10-CM | POA: Diagnosis not present

## 2024-06-25 DIAGNOSIS — M5441 Lumbago with sciatica, right side: Secondary | ICD-10-CM | POA: Diagnosis not present

## 2024-06-25 DIAGNOSIS — M9904 Segmental and somatic dysfunction of sacral region: Secondary | ICD-10-CM | POA: Diagnosis not present

## 2024-06-25 DIAGNOSIS — M9903 Segmental and somatic dysfunction of lumbar region: Secondary | ICD-10-CM | POA: Diagnosis not present

## 2024-06-27 ENCOUNTER — Telehealth: Payer: Self-pay | Admitting: Internal Medicine

## 2024-06-27 DIAGNOSIS — M9902 Segmental and somatic dysfunction of thoracic region: Secondary | ICD-10-CM | POA: Diagnosis not present

## 2024-06-27 DIAGNOSIS — M9904 Segmental and somatic dysfunction of sacral region: Secondary | ICD-10-CM | POA: Diagnosis not present

## 2024-06-27 DIAGNOSIS — M5441 Lumbago with sciatica, right side: Secondary | ICD-10-CM | POA: Diagnosis not present

## 2024-06-27 DIAGNOSIS — M9903 Segmental and somatic dysfunction of lumbar region: Secondary | ICD-10-CM | POA: Diagnosis not present

## 2024-06-27 DIAGNOSIS — M51362 Other intervertebral disc degeneration, lumbar region with discogenic back pain and lower extremity pain: Secondary | ICD-10-CM | POA: Diagnosis not present

## 2024-06-27 NOTE — Telephone Encounter (Signed)
 New Message:     Patient wants to talk to Dr Celine nurse about his Ablation. He says he have some questions and he wants it moved up asap please.wu

## 2024-07-02 DIAGNOSIS — I472 Ventricular tachycardia, unspecified: Secondary | ICD-10-CM | POA: Diagnosis not present

## 2024-07-02 DIAGNOSIS — Z95810 Presence of automatic (implantable) cardiac defibrillator: Secondary | ICD-10-CM | POA: Diagnosis not present

## 2024-07-13 NOTE — Telephone Encounter (Signed)
 Pt was seen By Dr Sharron on 07/02/24 with [facility] and [place] ablation scheduled for 08/07/2024.

## 2024-07-23 ENCOUNTER — Ambulatory Visit (INDEPENDENT_AMBULATORY_CARE_PROVIDER_SITE_OTHER): Payer: Medicare HMO

## 2024-07-23 DIAGNOSIS — I472 Ventricular tachycardia, unspecified: Secondary | ICD-10-CM | POA: Diagnosis not present

## 2024-07-24 LAB — CUP PACEART REMOTE DEVICE CHECK
Battery Remaining Longevity: 54 mo
Battery Remaining Percentage: 77 %
Brady Statistic RA Percent Paced: 8 %
Brady Statistic RV Percent Paced: 99 %
Date Time Interrogation Session: 20250728004100
HighPow Impedance: 67 Ohm
Implantable Lead Connection Status: 753985
Implantable Lead Connection Status: 753985
Implantable Lead Connection Status: 753985
Implantable Lead Implant Date: 20181022
Implantable Lead Implant Date: 20181022
Implantable Lead Implant Date: 20181022
Implantable Lead Location: 753858
Implantable Lead Location: 753859
Implantable Lead Location: 753860
Implantable Lead Model: 293
Implantable Lead Model: 4671
Implantable Lead Model: 7741
Implantable Lead Serial Number: 423377
Implantable Lead Serial Number: 809750
Implantable Lead Serial Number: 954460
Implantable Pulse Generator Implant Date: 20181022
Lead Channel Impedance Value: 431 Ohm
Lead Channel Impedance Value: 628 Ohm
Lead Channel Impedance Value: 929 Ohm
Lead Channel Pacing Threshold Amplitude: 0.7 V
Lead Channel Pacing Threshold Amplitude: 0.7 V
Lead Channel Pacing Threshold Amplitude: 1.1 V
Lead Channel Pacing Threshold Pulse Width: 0.4 ms
Lead Channel Pacing Threshold Pulse Width: 0.4 ms
Lead Channel Pacing Threshold Pulse Width: 0.4 ms
Lead Channel Setting Pacing Amplitude: 2 V
Lead Channel Setting Pacing Amplitude: 2 V
Lead Channel Setting Pacing Amplitude: 2.6 V
Lead Channel Setting Pacing Pulse Width: 0.4 ms
Lead Channel Setting Pacing Pulse Width: 0.4 ms
Lead Channel Setting Sensing Sensitivity: 0.6 mV
Lead Channel Setting Sensing Sensitivity: 1 mV
Pulse Gen Serial Number: 181086

## 2024-07-25 ENCOUNTER — Ambulatory Visit: Payer: Self-pay | Admitting: Cardiology

## 2024-08-01 DIAGNOSIS — Z01818 Encounter for other preprocedural examination: Secondary | ICD-10-CM | POA: Diagnosis not present

## 2024-08-02 DIAGNOSIS — M9903 Segmental and somatic dysfunction of lumbar region: Secondary | ICD-10-CM | POA: Diagnosis not present

## 2024-08-02 DIAGNOSIS — M5441 Lumbago with sciatica, right side: Secondary | ICD-10-CM | POA: Diagnosis not present

## 2024-08-02 DIAGNOSIS — M51362 Other intervertebral disc degeneration, lumbar region with discogenic back pain and lower extremity pain: Secondary | ICD-10-CM | POA: Diagnosis not present

## 2024-08-02 DIAGNOSIS — M9904 Segmental and somatic dysfunction of sacral region: Secondary | ICD-10-CM | POA: Diagnosis not present

## 2024-08-02 DIAGNOSIS — M9902 Segmental and somatic dysfunction of thoracic region: Secondary | ICD-10-CM | POA: Diagnosis not present

## 2024-08-09 DIAGNOSIS — M9904 Segmental and somatic dysfunction of sacral region: Secondary | ICD-10-CM | POA: Diagnosis not present

## 2024-08-09 DIAGNOSIS — M9902 Segmental and somatic dysfunction of thoracic region: Secondary | ICD-10-CM | POA: Diagnosis not present

## 2024-08-09 DIAGNOSIS — M51362 Other intervertebral disc degeneration, lumbar region with discogenic back pain and lower extremity pain: Secondary | ICD-10-CM | POA: Diagnosis not present

## 2024-08-09 DIAGNOSIS — M9903 Segmental and somatic dysfunction of lumbar region: Secondary | ICD-10-CM | POA: Diagnosis not present

## 2024-08-09 DIAGNOSIS — M5441 Lumbago with sciatica, right side: Secondary | ICD-10-CM | POA: Diagnosis not present

## 2024-08-13 ENCOUNTER — Ambulatory Visit (INDEPENDENT_AMBULATORY_CARE_PROVIDER_SITE_OTHER)

## 2024-08-13 DIAGNOSIS — Z0000 Encounter for general adult medical examination without abnormal findings: Secondary | ICD-10-CM

## 2024-08-13 NOTE — Progress Notes (Signed)
 Subjective:   Bryan Miller is a 73 y.o. male who presents for Medicare Annual/Subsequent preventive examination.  This wellness visit is conducted by a nurse.  The patient's medications were reviewed and reconciled since the patient's last visit.  History details were provided by the patient.  The history appears to be reliable.    Medical History: Patient history and Family history was reviewed  Medications, Allergies, and preventative health maintenance was reviewed and updated.   Visit Complete: Virtual I connected with  Bryan Miller on 08/13/24 by a audio enabled telemedicine application and verified that I am speaking with the correct person using two identifiers.  Patient Location: Other:  work  Restaurant manager, fast food Location: Office/Clinic  I discussed the limitations of evaluation and management by telemedicine. The patient expressed understanding and agreed to proceed.  Vital Signs: Because this visit was a virtual/telehealth visit, some criteria may be missing or patient reported. Any vitals not documented were not able to be obtained and vitals that have been documented are patient reported.  Patient Medicare AWV questionnaire was completed by the patient on 08/12/24; I have confirmed that all information answered by patient is correct and no changes since this date.  Cardiac Risk Factors include: advanced age (>46men, >65 women);obesity (BMI >30kg/m2);male gender     Objective:    There were no vitals filed for this visit.  Patient was unable to self-report due to a lack of equipment at home via telehealth  There is no height or weight on file to calculate BMI.     01/08/2022    4:00 PM  Advanced Directives  Does Patient Have a Medical Advance Directive? Yes  Type of Advance Directive Living will;Healthcare Power of Attorney  Does patient want to make changes to medical advance directive? No - Patient declined  Copy of Healthcare Power of Attorney in Chart? No - copy  requested    Current Medications (verified) Outpatient Encounter Medications as of 08/13/2024  Medication Sig   acetaminophen  (TYLENOL ) 500 MG tablet Take 500 mg by mouth at bedtime.   albuterol  (VENTOLIN  HFA) 108 (90 Base) MCG/ACT inhaler Inhale 1-2 puffs into the lungs every 6 (six) hours as needed.   alfuzosin (UROXATRAL) 10 MG 24 hr tablet Take 10 mg by mouth daily.   amiodarone  (PACERONE ) 200 MG tablet Take 1 tablet (200 mg total) by mouth daily.   aspirin  EC 81 MG tablet Take 81 mg by mouth daily.   atorvastatin  (LIPITOR) 40 MG tablet Take 1 tablet (40 mg total) by mouth daily.   bethanechol (URECHOLINE) 25 MG tablet Take 25 mg by mouth 2 (two) times daily.   empagliflozin  (JARDIANCE ) 10 MG TABS tablet Take 1 tablet (10 mg total) by mouth daily before breakfast.   Evolocumab  (REPATHA  SURECLICK) 140 MG/ML SOAJ Inject 140 mg into the skin every 14 (fourteen) days.   famotidine  (PEPCID ) 20 MG tablet Take 1 tablet (20 mg total) by mouth 2 (two) times daily.   fluticasone  (FLONASE ) 50 MCG/ACT nasal spray Place 2 sprays into both nostrils daily. (Patient taking differently: Place 2 sprays into both nostrils as needed.)   furosemide  (LASIX ) 20 MG tablet Take 1 tablet (20 mg total) by mouth daily.   ipratropium (ATROVENT ) 0.03 % nasal spray Place 2 sprays into both nostrils every 12 (twelve) hours.   levothyroxine  (SYNTHROID ) 88 MCG tablet Take 1 tablet (88 mcg total) by mouth daily before breakfast.   loratadine (CLARITIN) 10 MG tablet Take 10 mg by mouth daily as  needed for allergies.   metoprolol  succinate (TOPROL -XL) 25 MG 24 hr tablet Take 1 tablet (25 mg total) by mouth daily.   mexiletine (MEXITIL) 250 MG capsule Take 1 capsule (250 mg total) by mouth 2 (two) times daily.   nitroGLYCERIN  (NITROSTAT ) 0.4 MG SL tablet Place 1 tablet (0.4 mg total) under the tongue every 5 (five) minutes as needed for chest pain.   OVER THE COUNTER MEDICATION Place 1 spray into both nostrils as needed (nasal  congestion). Xytiol   ranolazine  (RANEXA ) 500 MG 12 hr tablet TAKE 1 TABLET BY MOUTH TWICE DAILY   spironolactone  (ALDACTONE ) 25 MG tablet TAKE 1/2 TABLET BY MOUTH EVERY NIGHT AT BEDTIME   No facility-administered encounter medications on file as of 08/13/2024.    Allergies (verified) Patient has no known allergies.   History: Past Medical History:  Diagnosis Date   CAD (coronary artery disease)    Chronic kidney disease, stage 3a (HCC)    Chronic systolic CHF (congestive heart failure) (HCC)    Dilated aortic root (HCC) 12/05/2018   Hyperlipidemia 08/15/2020   Hypothyroidism    Left bundle branch block 09/14/2019   Old myocardial infarction 06/26/2011   Prediabetes 05/05/2010   Presence of cardiac defibrillator    S/P CABG (coronary artery bypass graft)    Ventricular tachycardia, monomorphic (HCC)    Past Surgical History:  Procedure Laterality Date   ABDOMINAL HERNIA REPAIR  2020   bypass  2001   CARDIAC CATHETERIZATION     CHOLECYSTECTOMY  1996   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   INSERT / REPLACE / REMOVE PACEMAKER     LEFT HEART CATH AND CORS/GRAFTS ANGIOGRAPHY N/A 01/07/2022   Procedure: LEFT HEART CATH AND CORS/GRAFTS ANGIOGRAPHY;  Surgeon: Claudene Victory ORN, MD;  Location: [facility];  Service: Cardiovascular;  Laterality: N/A;   Family History  Problem Relation Age of Onset   Dementia Mother    Heart Problems Mother    Heart Problems Father    Social History   Socioeconomic History   Marital status: Married    Spouse name: Bryan Miller   Number of children: 3   Years of education: Not on file   Highest education level: Bachelor's degree (e.g., BA, AB, BS)  Occupational History   Not on file  Tobacco Use   Smoking status: Former    Current packs/day: 0.00    Types: Cigarettes    Quit date: 1970    Years since quitting: 55.6   Smokeless tobacco: Never  Vaping Use   Vaping status: Never Used  Substance and Sexual Activity   Alcohol use:  Not Currently   Drug use: Never   Sexual activity: Yes    Partners: Female  Other Topics Concern   Not on file  Social History Narrative   Retired - currently Armed forces operational officer at [employer].  Lives with wife, moved back to  from [place]  a few if years ago.  Has one son and two daughters.   Social Drivers of Corporate investment banker Strain: Low Risk  (08/12/2024)   Overall Financial Resource Strain (CARDIA)    Difficulty of Paying Living Expenses: Not hard at all  Food Insecurity: No Food Insecurity (08/12/2024)   Hunger Vital Sign    Worried About Running Out of Food in the Last Year: Never true    Ran Out of Food in the Last Year: Never true  Transportation Needs: No Transportation Needs (08/12/2024)   PRAPARE - Transportation  Lack of Transportation (Medical): No    Lack of Transportation (Non-Medical): No  Physical Activity: Insufficiently Active (08/12/2024)   Exercise Vital Sign    Days of Exercise per Week: 2 days    Minutes of Exercise per Session: 30 min  Stress: No Stress Concern Present (08/12/2024)   [employer] of Occupational Health - Occupational Stress Questionnaire    Feeling of Stress: Only a little  Social Connections: Moderately Integrated (08/12/2024)   Social Connection and Isolation Panel    Frequency of Communication with Friends and Family: More than three times a week    Frequency of Social Gatherings with Friends and Family: Three times a week    Attends Religious Services: More than 4 times per year    Active Member of Clubs or Organizations: No    Attends Engineer, structural: Not on file    Marital Status: Married    Tobacco Counseling Counseling given: Not Answered   Clinical Intake:  Pre-visit preparation completed: Yes  Pain : No/denies pain     BMI - recorded: 31.5 Nutritional Status: BMI > 30  Obese Nutritional Risks: None Diabetes: No  How often do you need to have someone help you when you read instructions,  pamphlets, or other written materials from your doctor or pharmacy?: 1 - Never  Interpreter Needed?: No      Activities of Daily Living    08/12/2024    7:59 PM  In your present state of health, do you have any difficulty performing the following activities:  Hearing? 0  Vision? 0  Difficulty concentrating or making decisions? 0  Walking or climbing stairs? 1  Dressing or bathing? 0  Doing errands, shopping? 0  Preparing Food and eating ? N  Using the Toilet? N  In the past six months, have you accidently leaked urine? N  Do you have problems with loss of bowel control? N  Managing your Medications? N  Managing your Finances? N  Housekeeping or managing your Housekeeping? N    Patient Care Team: CoxAbigail, MD as PCP - General (Family Medicine) Sharron Dover, MD as Referring Physician (Cardiology)  Indicate any recent Medical Services you may have received from other than [facility] providers in the past year (date may be approximate).     Assessment:   This is a routine wellness examination for [place].  Hearing/Vision screen No results found.   Goals Addressed   None    Depression Screen    08/13/2024    3:08 PM 05/08/2024    3:10 PM 02/07/2024    3:20 PM 08/09/2023    9:21 AM 08/04/2023    8:43 AM 01/27/2023    3:54 PM 07/21/2022   10:52 AM  PHQ 2/9 Scores  PHQ - 2 Score 0 0 0 0 0 0 0  PHQ- 9 Score  0  0 1      Fall Risk    08/12/2024    7:59 PM 05/08/2024    3:10 PM 08/09/2023    9:40 AM 08/04/2023    8:43 AM 01/27/2023    3:47 PM  Fall Risk   Falls in the past year? 0 0 1 1 0  Number falls in past yr: 0 0 0 0 0  Injury with Fall? 0 0 1 1 0  Risk for fall due to : No Fall Risks No Fall Risks History of fall(s) No Fall Risks No Fall Risks  Follow up Falls evaluation completed;Education provided  Falls evaluation completed  Falls evaluation completed Falls evaluation completed    MEDICARE RISK AT HOME: Medicare Risk at Home Any stairs in or around the home?:  (Patient-Rptd) Yes If so, are there any without handrails?: (Patient-Rptd) Yes Home free of loose throw rugs in walkways, pet beds, electrical cords, etc?: (Patient-Rptd) Yes Adequate lighting in your home to reduce risk of falls?: (Patient-Rptd) Yes Life alert?: (Patient-Rptd) No Use of a cane, walker or w/c?: (Patient-Rptd) No Grab bars in the bathroom?: (Patient-Rptd) Yes Shower chair or bench in shower?: (Patient-Rptd) Yes Elevated toilet seat or a handicapped toilet?: (Patient-Rptd) No  TIMED UP AND GO:  Was the test performed?  No    Cognitive Function:        08/13/2024    3:17 PM 08/09/2023    9:41 AM 07/21/2022   10:55 AM  6CIT Screen  What Year? 0 points 0 points 0 points  What month? 0 points 0 points 0 points  What time? 0 points 0 points 0 points  Count back from 20 0 points 0 points 0 points  Months in reverse 0 points 0 points 0 points  Repeat phrase 0 points 0 points 0 points  Total Score 0 points 0 points 0 points    Immunizations Immunization History  Administered Date(s) Administered   Influenza, Seasonal, Injecte, Preservative Fre 01/06/2012   Influenza,inj,Quad PF,6+ Mos 11/30/2012, 10/03/2013, 09/21/2016, 06/18/2019   Influenza,inj,quad, With Preservative 10/10/2015   PPD Test 10/27/2012   Pneumococcal Conjugate-13 10/10/2015   Pneumococcal Polysaccharide-23 05/04/2010, 01/25/2017   Tdap 10/03/2013   Zoster, Live 08/08/2018    TDAP status: Due, Education has been provided regarding the importance of this vaccine. Advised may receive this vaccine at local pharmacy or Health Dept. Aware to provide a copy of the vaccination record if obtained from local pharmacy or Health Dept. Verbalized acceptance and understanding.  Flu Vaccine status: Declined, Education has been provided regarding the importance of this vaccine but patient still declined. Advised may receive this vaccine at local pharmacy or Health Dept. Aware to provide a copy of the vaccination  record if obtained from local pharmacy or Health Dept. Verbalized acceptance and understanding.  Pneumococcal vaccine status: Up to date  Covid-19 vaccine status: Declined, Education has been provided regarding the importance of this vaccine but patient still declined. Advised may receive this vaccine at local pharmacy or Health Dept.or vaccine clinic. Aware to provide a copy of the vaccination record if obtained from local pharmacy or Health Dept. Verbalized acceptance and understanding.  Qualifies for Shingles Vaccine? Yes   Zostavax completed No   Shingrix Completed?: No.    Education has been provided regarding the importance of this vaccine. Patient has been advised to call insurance company to determine out of pocket expense if they have not yet received this vaccine. Advised may also receive vaccine at local pharmacy or Health Dept. Verbalized acceptance and understanding.  Screening Tests Health Maintenance  Topic Date Due   Zoster Vaccines- Shingrix (1 of 2) 08/31/1969   DTaP/Tdap/Td (2 - Td or Tdap) 10/04/2023   Medicare Annual Wellness (AWV)  08/08/2024   INFLUENZA VACCINE  07/27/2024   Fecal DNA (Cologuard)  05/28/2026   Pneumococcal Vaccine: 50+ Years  Completed   Hepatitis C Screening  Completed   HPV VACCINES  Aged Out   Meningococcal B Vaccine  Aged Out   Pneumococcal Vaccine  Discontinued   COVID-19 Vaccine  Discontinued    Health Maintenance  Health Maintenance Due  Topic Date Due   Zoster  Vaccines- Shingrix (1 of 2) 08/31/1969   DTaP/Tdap/Td (2 - Td or Tdap) 10/04/2023   Medicare Annual Wellness (AWV)  08/08/2024   INFLUENZA VACCINE  07/27/2024    Colorectal cancer screening: Type of screening: Cologuard. Completed 05/2023. Repeat every 3 years  Lung Cancer Screening: (Low Dose CT Chest recommended if Age 74-80 years, 20 pack-year currently smoking OR have quit w/in 15years.) does not qualify.   Lung Cancer Screening Referral: N/A  Additional  Screening:  Hepatitis C Screening: does qualify; Completed 01/2024  Vision Screening: Recommended annual ophthalmology exams for early detection of glaucoma and other disorders of the eye. Is the patient up to date with their annual eye exam?  Yes   Dental Screening: Recommended annual dental exams for proper oral hygiene  Community Resource Referral / Chronic Care Management: CRR required this visit?  No   CCM required this visit?  No     Plan:    1- Shingrix Vaccine and Flu vaccine declined.  Patient may get Tetanus vaccine at the pharmacy in the future. 2- Patient is scheduled for procedure with [facility] Friday to correct Afib (Dr Sharron)  I have personally reviewed and noted the following in the patient's chart:   Medical and social history Use of alcohol, tobacco or illicit drugs  Current medications and supplements including opioid prescriptions. Patient is not currently taking opioid prescriptions. Functional ability and status Nutritional status Physical activity Advanced directives List of other physicians Hospitalizations, surgeries, and ER visits in previous 12 months Vitals Screenings to include cognitive, depression, and falls Referrals and appointments  In addition, I have reviewed and discussed with patient certain preventive protocols, quality metrics, and best practice recommendations. A written personalized care plan for preventive services as well as general preventive health recommendations were provided to patient.     Suzen CHRISTELLA Sharps, LPN   [id number]   After Visit Summary: (MyChart) Due to this being a telephonic visit, the after visit summary with patients personalized plan was offered to patient via MyChart

## 2024-08-13 NOTE — Patient Instructions (Signed)
 Bryan Miller , Thank you for taking time to come for your Medicare Wellness Visit. I appreciate your ongoing commitment to your health goals. Please review the following plan we discussed and let me know if I can assist you in the future.    This is a list of the screening recommended for you and due dates:  Health Maintenance  Topic Date Due   Zoster (Shingles) Vaccine (1 of 2) 08/31/1969   DTaP/Tdap/Td vaccine (2 - Td or Tdap) 10/04/2023   Flu Shot  03/26/2025*   Medicare Annual Wellness Visit  08/13/2025   Cologuard (Stool DNA test)  05/28/2026   Pneumococcal Vaccine for age over 35  Completed   Hepatitis C Screening  Completed   HPV Vaccine  Aged Out   Meningitis B Vaccine  Aged Out   Pneumococcal Vaccine  Discontinued   COVID-19 Vaccine  Discontinued  *Topic was postponed. The date shown is not the original due date.     Preventive Care 57 Years and Older, Male  Preventive care refers to lifestyle choices and visits with your health care provider that can promote health and wellness. What does preventive care include? A yearly physical exam. This is also called an annual well check. Dental exams once or twice a year. Routine eye exams. Ask your health care provider how often you should have your eyes checked. Personal lifestyle choices, including: Daily care of your teeth and gums. Regular physical activity. Eating a healthy diet. Avoiding tobacco and drug use. Limiting alcohol use. Practicing safe sex. Taking low doses of aspirin  every day. Taking vitamin and mineral supplements as recommended by your health care provider. What happens during an annual well check? The services and screenings done by your health care provider during your annual well check will depend on your age, overall health, lifestyle risk factors, and family history of disease. Counseling  Your health care provider may ask you questions about your: Alcohol use. Tobacco use. Drug use. Emotional  well-being. Home and relationship well-being. Sexual activity. Eating habits. History of falls. Memory and ability to understand (cognition). Work and work Astronomer. Screening  You may have the following tests or measurements: Height, weight, and BMI. Blood pressure. Lipid and cholesterol levels. These may be checked every 5 years, or more frequently if you are over 74 years old. Skin check. Lung cancer screening. You may have this screening every year starting at age 53 if you have a 30-pack-year history of smoking and currently smoke or have quit within the past 15 years. Fecal occult blood test (FOBT) of the stool. You may have this test every year starting at age 49. Flexible sigmoidoscopy or colonoscopy. You may have a sigmoidoscopy every 5 years or a colonoscopy every 10 years starting at age 48. Prostate cancer screening. Recommendations will vary depending on your family history and other risks. Hepatitis C blood test. Hepatitis B blood test. Sexually transmitted disease (STD) testing. Diabetes screening. This is done by checking your blood sugar (glucose) after you have not eaten for a while (fasting). You may have this done every 1-3 years. Abdominal aortic aneurysm (AAA) screening. You may need this if you are a current or former smoker. Osteoporosis. You may be screened starting at age 29 if you are at high risk. Talk with your health care provider about your test results, treatment options, and if necessary, the need for more tests. Vaccines  Your health care provider may recommend certain vaccines, such as: Influenza vaccine. This is recommended every  year. Tetanus, diphtheria, and acellular pertussis (Tdap, Td) vaccine. You may need a Td booster every 10 years. Zoster vaccine. You may need this after age 46. Pneumococcal 13-valent conjugate (PCV13) vaccine. One dose is recommended after age 98. Pneumococcal polysaccharide (PPSV23) vaccine. One dose is recommended after  age 23. Talk to your health care provider about which screenings and vaccines you need and how often you need them. This information is not intended to replace advice given to you by your health care provider. Make sure you discuss any questions you have with your health care provider. Document Released: 01/09/2016 Document Revised: 09/01/2016 Document Reviewed: 10/14/2015 Elsevier Interactive Patient Education  2017 [employer].  Fall Prevention in the Home Falls can cause injuries. They can happen to people of all ages. There are many things you can do to make your home safe and to help prevent falls. What can I do on the outside of my home? Regularly fix the edges of walkways and driveways and fix any cracks. Remove anything that might make you trip as you walk through a door, such as a raised step or threshold. Trim any bushes or trees on the path to your home. Use bright outdoor lighting. Clear any walking paths of anything that might make someone trip, such as rocks or tools. Regularly check to see if handrails are loose or broken. Make sure that both sides of any steps have handrails. Any raised decks and porches should have guardrails on the edges. Have any leaves, snow, or ice cleared regularly. Use sand or salt on walking paths during winter. Clean up any spills in your garage right away. This includes oil or grease spills. What can I do in the bathroom? Use night lights. Install grab bars by the toilet and in the tub and shower. Do not use towel bars as grab bars. Use non-skid mats or decals in the tub or shower. If you need to sit down in the shower, use a plastic, non-slip stool. Keep the floor dry. Clean up any water that spills on the floor as soon as it happens. Remove soap buildup in the tub or shower regularly. Attach bath mats securely with double-sided non-slip rug tape. Do not have throw rugs and other things on the floor that can make you trip. What can I do in the  bedroom? Use night lights. Make sure that you have a light by your bed that is easy to reach. Do not use any sheets or blankets that are too big for your bed. They should not hang down onto the floor. Have a firm chair that has side arms. You can use this for support while you get dressed. Do not have throw rugs and other things on the floor that can make you trip. What can I do in the kitchen? Clean up any spills right away. Avoid walking on wet floors. Keep items that you use a lot in easy-to-reach places. If you need to reach something above you, use a strong step stool that has a grab bar. Keep electrical cords out of the way. Do not use floor polish or wax that makes floors slippery. If you must use wax, use non-skid floor wax. Do not have throw rugs and other things on the floor that can make you trip. What can I do with my stairs? Do not leave any items on the stairs. Make sure that there are handrails on both sides of the stairs and use them. Fix handrails that are broken or  loose. Make sure that handrails are as long as the stairways. Check any carpeting to make sure that it is firmly attached to the stairs. Fix any carpet that is loose or worn. Avoid having throw rugs at the top or bottom of the stairs. If you do have throw rugs, attach them to the floor with carpet tape. Make sure that you have a light switch at the top of the stairs and the bottom of the stairs. If you do not have them, ask someone to add them for you. What else can I do to help prevent falls? Wear shoes that: Do not have high heels. Have rubber bottoms. Are comfortable and fit you well. Are closed at the toe. Do not wear sandals. If you use a stepladder: Make sure that it is fully opened. Do not climb a closed stepladder. Make sure that both sides of the stepladder are locked into place. Ask someone to hold it for you, if possible. Clearly mark and make sure that you can see: Any grab bars or  handrails. First and last steps. Where the edge of each step is. Use tools that help you move around (mobility aids) if they are needed. These include: Canes. Walkers. Scooters. Crutches. Turn on the lights when you go into a dark area. Replace any light bulbs as soon as they burn out. Set up your furniture so you have a clear path. Avoid moving your furniture around. If any of your floors are uneven, fix them. If there are any pets around you, be aware of where they are. Review your medicines with your doctor. Some medicines can make you feel dizzy. This can increase your chance of falling. Ask your doctor what other things that you can do to help prevent falls. This information is not intended to replace advice given to you by your health care provider. Make sure you discuss any questions you have with your health care provider. Document Released: 10/09/2009 Document Revised: 05/20/2016 Document Reviewed: 01/17/2015 Elsevier Interactive Patient Education  2017 [employer].

## 2024-08-16 DIAGNOSIS — M9904 Segmental and somatic dysfunction of sacral region: Secondary | ICD-10-CM | POA: Diagnosis not present

## 2024-08-16 DIAGNOSIS — M51362 Other intervertebral disc degeneration, lumbar region with discogenic back pain and lower extremity pain: Secondary | ICD-10-CM | POA: Diagnosis not present

## 2024-08-16 DIAGNOSIS — M9903 Segmental and somatic dysfunction of lumbar region: Secondary | ICD-10-CM | POA: Diagnosis not present

## 2024-08-16 DIAGNOSIS — M9902 Segmental and somatic dysfunction of thoracic region: Secondary | ICD-10-CM | POA: Diagnosis not present

## 2024-08-16 DIAGNOSIS — M5441 Lumbago with sciatica, right side: Secondary | ICD-10-CM | POA: Diagnosis not present

## 2024-08-17 DIAGNOSIS — I252 Old myocardial infarction: Secondary | ICD-10-CM | POA: Diagnosis not present

## 2024-08-17 DIAGNOSIS — I341 Nonrheumatic mitral (valve) prolapse: Secondary | ICD-10-CM | POA: Diagnosis not present

## 2024-08-17 DIAGNOSIS — E669 Obesity, unspecified: Secondary | ICD-10-CM | POA: Diagnosis not present

## 2024-08-17 DIAGNOSIS — I255 Ischemic cardiomyopathy: Secondary | ICD-10-CM | POA: Diagnosis not present

## 2024-08-17 DIAGNOSIS — R7303 Prediabetes: Secondary | ICD-10-CM | POA: Diagnosis not present

## 2024-08-17 DIAGNOSIS — Z6832 Body mass index (BMI) 32.0-32.9, adult: Secondary | ICD-10-CM | POA: Diagnosis not present

## 2024-08-17 DIAGNOSIS — I472 Ventricular tachycardia, unspecified: Secondary | ICD-10-CM | POA: Diagnosis not present

## 2024-08-17 DIAGNOSIS — I5022 Chronic systolic (congestive) heart failure: Secondary | ICD-10-CM | POA: Diagnosis not present

## 2024-08-17 DIAGNOSIS — I2510 Atherosclerotic heart disease of native coronary artery without angina pectoris: Secondary | ICD-10-CM | POA: Diagnosis not present

## 2024-08-17 DIAGNOSIS — E785 Hyperlipidemia, unspecified: Secondary | ICD-10-CM | POA: Diagnosis not present

## 2024-08-18 DIAGNOSIS — I472 Ventricular tachycardia, unspecified: Secondary | ICD-10-CM | POA: Diagnosis not present

## 2024-08-18 DIAGNOSIS — I2510 Atherosclerotic heart disease of native coronary artery without angina pectoris: Secondary | ICD-10-CM | POA: Diagnosis not present

## 2024-08-18 DIAGNOSIS — I252 Old myocardial infarction: Secondary | ICD-10-CM | POA: Diagnosis not present

## 2024-08-18 DIAGNOSIS — I255 Ischemic cardiomyopathy: Secondary | ICD-10-CM | POA: Diagnosis not present

## 2024-08-18 DIAGNOSIS — I5022 Chronic systolic (congestive) heart failure: Secondary | ICD-10-CM | POA: Diagnosis not present

## 2024-08-18 DIAGNOSIS — I341 Nonrheumatic mitral (valve) prolapse: Secondary | ICD-10-CM | POA: Diagnosis not present

## 2024-08-18 DIAGNOSIS — Z6832 Body mass index (BMI) 32.0-32.9, adult: Secondary | ICD-10-CM | POA: Diagnosis not present

## 2024-08-18 DIAGNOSIS — Z951 Presence of aortocoronary bypass graft: Secondary | ICD-10-CM | POA: Diagnosis not present

## 2024-08-18 DIAGNOSIS — E669 Obesity, unspecified: Secondary | ICD-10-CM | POA: Diagnosis not present

## 2024-08-18 DIAGNOSIS — R7303 Prediabetes: Secondary | ICD-10-CM | POA: Diagnosis not present

## 2024-09-07 ENCOUNTER — Telehealth: Payer: Self-pay | Admitting: Physician Assistant

## 2024-09-07 NOTE — Telephone Encounter (Signed)
 Pt's wife calling on behalf of husband. Pt would like Renee Ursuy's recommendation as to who pt should see for EP needs since Dr. Fernande is retiring. Please advise.

## 2024-09-07 NOTE — Telephone Encounter (Signed)
 Spoke to patient's wife advised I will send message to Charlies Arthur PA for advice.

## 2024-09-09 ENCOUNTER — Other Ambulatory Visit: Payer: Self-pay | Admitting: Internal Medicine

## 2024-09-11 ENCOUNTER — Ambulatory Visit: Admitting: Family Medicine

## 2024-09-16 NOTE — Progress Notes (Unsigned)
 Cardiology Office Note:  .   Date:  09/17/2024  ID:  Bryan Miller, DOB 1951/07/25, MRN [id number] PCP: Sherre Clapper, MD  [facility] Providers Cardiologist:  Dr. Wendel EP: Dr. Fernande (inactive) > Dr. Inocencio (not yet seen) {  History of Present Illness: .   Bryan Miller is a 73 y.o. 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 Fernande 4/4/5, amio increased, discussed perhaps VT ablation  VT ablation with Dr. Sharron on 08/17/24  Today's visit is scheduled as a 6 mo visit ROS:   He is accompanied by his wife He feels well No symptoms of VT, no shocks, no palpitations, dizziness, near syncope or syncope since his ablation Very happy  His wife says he looks better, isn't falling asleep at church anymore! He says he feels like he breaths better, getting more oxygen  He sees one of Dr. Jethro team next week and him in a month No post procedural concerns    Device information BSCi CRT-D implanted 10/17/2017   Device is on advisory for GORE lead   Arrhythmia/AAD hx In review > historically living in [place] , [place]  and >>  [place] ([place] > [place]. Historically had been on sotalol and back and forth on/off/on amiodarone   VT w/ATP Jan 2023 >> amio restarted Cath with CAD > medical management VT Feb 2024  treated with ATP April 2025 > syncope w/VT Dr. Sharron VT ablation 08/17/24  Studies Reviewed: SABRA    EKG not done today   DEVICE interrogation done today and reviewed by myself Battery and auto lead measurements are good No VT  Advisory: Shock impedance is OK Max Joules shocks programmed appropriately shock polarity is initial   11/01/22 TTE IMPRESSIONS  1. Left ventricular ejection fraction, by estimation, is 40 to 45%. Left  ventricular ejection fraction by PLAX is 42 %. The left ventricle has  mildly decreased function. The left ventricle demonstrates regional wall  motion abnormalities (see scoring  diagram/findings for description). The left ventricular internal cavity  size was severely dilated. Left ventricular diastolic parameters are  consistent with Grade I diastolic dysfunction (impaired relaxation). There  is severe hypokinesis of the left  ventricular, basal-mid inferior wall.   2. Right ventricular systolic function is mildly reduced. The right  ventricular size is normal.   3. Left atrial size was mildly dilated.   4. The mitral valve is grossly normal. Trivial mitral valve  regurgitation.   5. The aortic valve is tricuspid. Aortic valve regurgitation is not  visualized.   6. Aortic dilatation noted. There is mild dilatation of the aortic root,  measuring 41 mm.   Comparison(s): No significant change from prior study. 01/06/2022: LVEF  40-45%.    01/07/2022: LHC Occluded vein graft to the right coronary.  Mid occlusion of the right coronary native vessel. Total occlusion of  the mid LAD and first diagonal. Widely patent circumflex artery.  Collaterals to the distal RCA are noted Widely patent left main. Normal LVEDP at 11 mmHg. Patent LIMA to the LAD with 95% stenosis in the mid LAD retrograde/proximal to the internal mammary insertion site leaving the mid LAD segment potentially ischemic.  This is not reachable and not a technically feasible site for PCI  due to angulation.   Risk Assessment/Calculations:    Physical Exam:   VS:  BP 96/61   Pulse 66   Ht 6' 3 (1.905 m)   Wt 253 lb (114.8 kg)   SpO2 96%   BMI 31.62 kg/m    Wt Readings from Last 3 Encounters:  09/17/24 253 lb (114.8 kg)  05/08/24 252 lb (114.3 kg)  03/30/24 249 lb (112.9 kg)     Repeat BP is 100/60  GEN: Well nourished, well developed in no acute distress NECK: No JVD; No carotid bruits CARDIAC: RRR, no murmurs, rubs, gallops RESPIRATORY: CTA b/l without rales, wheezing or rhonchi  ABDOMEN: Soft, non-tender, non-distended EXTREMITIES:  No edema; No deformity   ICD site: is stable, no thinning, fluctuation, tethering  ASSESSMENT AND PLAN: .    ICD Intact function no programming changes made   CAD No CP, or anginal symptoms VT is known for him, had VT in 2023 > cath then > medical management C/w Dr. Audria   Chronic CHF ICM No symptoms or exam findings of volume OL C/w Dr. Audria  6.  VT Chronic Amiodarone  Ranexa  S/p ablation with Dr. Sharron Sees his team next week, they will discuss his amiodarone /AAD recommendations there Since he has to change EP MD's, they think they would prefer to continue care with him  They will let us  know if he plans to keep his EP f/u there > to transfer remotes  Dispo: will keep a 94mo visit in place here, sooner if needed  Signed, Charlies Macario Arthur, PA-C

## 2024-09-17 ENCOUNTER — Ambulatory Visit: Attending: Physician Assistant | Admitting: Physician Assistant

## 2024-09-17 VITALS — BP 96/61 | HR 66 | Ht 75.0 in | Wt 253.0 lb

## 2024-09-17 DIAGNOSIS — I255 Ischemic cardiomyopathy: Secondary | ICD-10-CM | POA: Diagnosis not present

## 2024-09-17 DIAGNOSIS — Z95810 Presence of automatic (implantable) cardiac defibrillator: Secondary | ICD-10-CM

## 2024-09-17 DIAGNOSIS — I472 Ventricular tachycardia, unspecified: Secondary | ICD-10-CM

## 2024-09-17 DIAGNOSIS — Z79899 Other long term (current) drug therapy: Secondary | ICD-10-CM

## 2024-09-17 DIAGNOSIS — I2510 Atherosclerotic heart disease of native coronary artery without angina pectoris: Secondary | ICD-10-CM | POA: Diagnosis not present

## 2024-09-17 LAB — CUP PACEART INCLINIC DEVICE CHECK
Date Time Interrogation Session: 20250922175929
HighPow Impedance: 57 Ohm
Implantable Lead Connection Status: 753985
Implantable Lead Connection Status: 753985
Implantable Lead Connection Status: 753985
Implantable Lead Implant Date: 20181022
Implantable Lead Implant Date: 20181022
Implantable Lead Implant Date: 20181022
Implantable Lead Location: 753858
Implantable Lead Location: 753859
Implantable Lead Location: 753860
Implantable Lead Model: 293
Implantable Lead Model: 4671
Implantable Lead Model: 7741
Implantable Lead Serial Number: 423377
Implantable Lead Serial Number: 809750
Implantable Lead Serial Number: 954460
Implantable Pulse Generator Implant Date: 20181022
Lead Channel Setting Pacing Amplitude: 2 V
Lead Channel Setting Pacing Amplitude: 2 V
Lead Channel Setting Pacing Amplitude: 2.6 V
Lead Channel Setting Pacing Pulse Width: 0.4 ms
Lead Channel Setting Pacing Pulse Width: 0.4 ms
Lead Channel Setting Sensing Sensitivity: 0.6 mV
Lead Channel Setting Sensing Sensitivity: 1 mV
Pulse Gen Serial Number: 181086

## 2024-09-17 NOTE — Patient Instructions (Signed)
 Medication Instructions:   Your physician recommends that you continue on your current medications as directed. Please refer to the Current Medication list given to you today.    *If you need a refill on your cardiac medications before your next appointment, please call your pharmacy*    Lab Work:  PLEASE GO DOWN STAIRS  [facility]  FIRST FLOOR   ( GET OFF ELEVATORS WALK TOWARDS WAITING AREA LAB LOCATED BY PHARMACY):  LFT AND TSH TODAY       If you have labs (blood work) drawn today and your tests are completely normal, you will receive your results only by: MyChart Message (if you have MyChart) OR A paper copy in the mail If you have any lab test that is abnormal or we need to change your treatment, we will call you to review the results.     Testing/Procedures: NONE ORDERED  TODAY      Follow-Up:  At [facility], you and your health needs are our priority.  As part of our continuing mission to provide you with exceptional heart care, our providers are all part of one team.  This team includes your primary Cardiologist (physician) and Advanced Practice Providers or APPs (Physician Assistants and Nurse Practitioners) who all work together to provide you with the care you need, when you need it.    Your next appointment:  IN 2 MONTHS WITH DR THUKKANI / APP  AND  6 month(s)   Provider:   DR INOCENCIO       We recommend signing up for the patient portal called MyChart.  Sign up information is provided on this After Visit Summary.  MyChart is used to connect with patients for Virtual Visits (Telemedicine).  Patients are able to view lab/test results, encounter notes, upcoming appointments, etc.  Non-urgent messages can be sent to your provider as well.   To learn more about what you can do with MyChart, go to [email or web address].   Other Instructions

## 2024-09-18 ENCOUNTER — Ambulatory Visit: Payer: Self-pay | Admitting: Cardiology

## 2024-09-18 LAB — HEPATIC FUNCTION PANEL
ALT: 15 IU/L (ref 0–44)
AST: 19 IU/L (ref 0–40)
Albumin: 4.2 g/dL (ref 3.8–4.8)
Alkaline Phosphatase: 85 IU/L (ref 47–123)
Bilirubin Total: 0.8 mg/dL (ref 0.0–1.2)
Bilirubin, Direct: 0.32 mg/dL (ref 0.00–0.40)
Total Protein: 6.8 g/dL (ref 6.0–8.5)

## 2024-09-18 LAB — TSH: TSH: 7.16 u[IU]/mL — AB (ref 0.450–4.500)

## 2024-09-19 DIAGNOSIS — M9904 Segmental and somatic dysfunction of sacral region: Secondary | ICD-10-CM | POA: Diagnosis not present

## 2024-09-19 DIAGNOSIS — M9902 Segmental and somatic dysfunction of thoracic region: Secondary | ICD-10-CM | POA: Diagnosis not present

## 2024-09-19 DIAGNOSIS — M9903 Segmental and somatic dysfunction of lumbar region: Secondary | ICD-10-CM | POA: Diagnosis not present

## 2024-09-24 DIAGNOSIS — Z95810 Presence of automatic (implantable) cardiac defibrillator: Secondary | ICD-10-CM | POA: Diagnosis not present

## 2024-09-24 DIAGNOSIS — I472 Ventricular tachycardia, unspecified: Secondary | ICD-10-CM | POA: Diagnosis not present

## 2024-09-24 DIAGNOSIS — I2510 Atherosclerotic heart disease of native coronary artery without angina pectoris: Secondary | ICD-10-CM | POA: Diagnosis not present

## 2024-09-26 DIAGNOSIS — M9903 Segmental and somatic dysfunction of lumbar region: Secondary | ICD-10-CM | POA: Diagnosis not present

## 2024-09-26 DIAGNOSIS — M9904 Segmental and somatic dysfunction of sacral region: Secondary | ICD-10-CM | POA: Diagnosis not present

## 2024-09-26 DIAGNOSIS — M9902 Segmental and somatic dysfunction of thoracic region: Secondary | ICD-10-CM | POA: Diagnosis not present

## 2024-09-27 ENCOUNTER — Encounter: Payer: Self-pay | Admitting: Family Medicine

## 2024-09-27 DIAGNOSIS — M9904 Segmental and somatic dysfunction of sacral region: Secondary | ICD-10-CM | POA: Diagnosis not present

## 2024-09-27 DIAGNOSIS — M9903 Segmental and somatic dysfunction of lumbar region: Secondary | ICD-10-CM | POA: Diagnosis not present

## 2024-09-27 DIAGNOSIS — M9902 Segmental and somatic dysfunction of thoracic region: Secondary | ICD-10-CM | POA: Diagnosis not present

## 2024-09-27 NOTE — Progress Notes (Signed)
 Remote ICD Transmission

## 2024-09-28 ENCOUNTER — Ambulatory Visit (INDEPENDENT_AMBULATORY_CARE_PROVIDER_SITE_OTHER)
Admission: RE | Admit: 2024-09-28 | Discharge: 2024-09-28 | Disposition: A | Discharge status: Home / Self Care | Source: Ambulatory Visit

## 2024-09-28 ENCOUNTER — Ambulatory Visit (INDEPENDENT_AMBULATORY_CARE_PROVIDER_SITE_OTHER)

## 2024-09-28 ENCOUNTER — Ambulatory Visit: Payer: Self-pay

## 2024-09-28 VITALS — BP 90/68 | HR 65 | Temp 97.2°F | Resp 16 | Ht 75.0 in | Wt 257.0 lb

## 2024-09-28 DIAGNOSIS — M25551 Pain in right hip: Secondary | ICD-10-CM

## 2024-09-28 DIAGNOSIS — M48061 Spinal stenosis, lumbar region without neurogenic claudication: Secondary | ICD-10-CM | POA: Diagnosis not present

## 2024-09-28 DIAGNOSIS — G8929 Other chronic pain: Secondary | ICD-10-CM

## 2024-09-28 DIAGNOSIS — M419 Scoliosis, unspecified: Secondary | ICD-10-CM | POA: Diagnosis not present

## 2024-09-28 DIAGNOSIS — M47816 Spondylosis without myelopathy or radiculopathy, lumbar region: Secondary | ICD-10-CM | POA: Diagnosis not present

## 2024-09-28 DIAGNOSIS — M545 Low back pain, unspecified: Secondary | ICD-10-CM

## 2024-09-28 DIAGNOSIS — M160 Bilateral primary osteoarthritis of hip: Secondary | ICD-10-CM | POA: Diagnosis not present

## 2024-09-28 DIAGNOSIS — M5116 Intervertebral disc disorders with radiculopathy, lumbar region: Secondary | ICD-10-CM | POA: Diagnosis not present

## 2024-09-28 MED ORDER — METHOCARBAMOL 750 MG PO TABS: 750.0000 mg | ORAL_TABLET | Freq: Two times a day (BID) | ORAL | 0 refills | Status: DC | PRN

## 2024-09-28 NOTE — Telephone Encounter (Signed)
 FYI Only or Action Required?: FYI only for provider.  Patient was last seen in primary care on 05/08/2024 by Sherre Clapper, MD.  Called Nurse Triage reporting Hip Pain.  Symptoms began issue began last year. It was nearly resolved and now is back. Knots and pain in hip.  Interventions attempted: Ice/heat application.  Symptoms are: gradually worsening.  Triage Disposition: See HCP Within 4 Hours (Or PCP Triage)  Patient/caregiver understands and will follow disposition?: Yes                      Copied from CRM [id number]. Topic: Clinical - Red Word Triage >> Sep 28, 2024  7:44 AM Larissa RAMAN wrote: [facility] that prompted transfer to Nurse Triage: rt hip pain- worsening Reason for Disposition  [1] SEVERE pain (e.g., excruciating, unable to do any normal activities) AND [2] not improved after 2 hours of pain medicine  Answer Assessment - Initial Assessment Questions 1. LOCATION and RADIATION: Where is the pain located? Does the pain spread (shoot) anywhere else?     Right hip 2. QUALITY: What does the pain feel like?  (e.g., sharp, dull, aching, burning)     sharp 3. SEVERITY: How bad is the pain? What does it keep you from doing?   (Scale 1-10; or mild, moderate, severe)     5/10 4. ONSET: When did the pain start? Does it come and go, or is it there all the time?     Last year  - resolved, then he injured again in the  it in the summer 5. WORK OR EXERCISE: Has there been any recent work or exercise that involved this part of the body?      Pulling hose 6. CAUSE: What do you think is causing the hip pain?      Fall from last year, now has knots in the hip 7. AGGRAVATING FACTORS: What makes the hip pain worse? (e.g., walking, climbing stairs, running)     walking 8. OTHER SYMPTOMS: Do you have any other symptoms? (e.g., back pain, pain shooting down leg,  fever, rash)     no  Protocols used: Hip Pain-A-AH
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
## 2024-09-28 NOTE — Patient Instructions (Signed)
  VISIT SUMMARY: Today, we discussed your right low back pain, which has been troubling you for the past three weeks. We reviewed your history of musculoskeletal trauma and previous injuries, and we have a plan to manage your pain and investigate further.  YOUR PLAN: LOW BACK PAIN: You have chronic low back pain on the right side, which is worsened by certain movements. There are no signs of nerve damage, but we need to check for arthritis or other issues. -We will order an x-ray of your lumbar spine to check for arthritis and any vertebral displacement. -We will also order an x-ray of your right hip to rule out any hip problems. -You will be prescribed a non-sedating muscle relaxant to take up to twice daily as needed for pain relief. -Continue using Tylenol  for pain management. -Limit the use of your back brace to 2-3 hours per day during physical activity to prevent weakening of your core muscles. -You will be referred to [facility] for exercises to strengthen your core and manage pain. -Maintain physical activity and a healthy diet to support your overall health and muscle strength. -Seek emergency care if your pain becomes severe or if you experience leg weakness or inability to move your leg. -We may consider an MRI if the x-ray shows severe arthritis or if your symptoms get worse.  RIGHT HIP PAIN: You have intermittent right hip pain, which may be related to your low back issues. -We will order an x-ray of your right hip to rule out any hip problems.                      Contains text generated by Abridge.                                 Contains text generated by Abridge.

## 2024-09-28 NOTE — Assessment & Plan Note (Addendum)
 Low back pain with right-sided predominance due to lumbar spondylosis and muscle strain Chronic low back pain with right-sided predominance, exacerbated by certain movements, located in the right low back, slightly above the right buttock, occasionally radiating to the left side. No radicular symptoms such as leg weakness or paresthesia, suggesting no acute nerve impingement. Previous VT ablation with no current leg pain. Severe arthritis or spinal stenosis is not suspected without further imaging. - Order x-ray of the lumbar spine to assess the extent of arthritis and any vertebral displacement. - Order x-ray of the right hip to rule out hip pathology. - Prescribe non-sedating muscle relaxant METHOCARBAMOL to be taken up to twice daily as needed for pain relief. - Advise continuation of Tylenol  for pain management. - Recommend limiting the use of the back brace to 2-3 hours per day during physical activity to prevent core muscle weakening. - Refer to physical therapy at [facility] for core strengthening and pain management exercises. - Advise maintaining physical activity and healthy diet to support overall health and muscle strength. - Instruct to seek emergency care if pain becomes severe or if there is leg weakness or inability to move the leg. - Discuss potential for MRI if x-ray findings suggest severe arthritis or if symptoms worsen.  Right hip pain Intermittent right hip pain, possibly related to lumbar spondylosis and muscle strain. - Order x-ray of the right hip to rule out hip pathology.  Orders:   DG Lumbar Spine Complete; Future   DG Hip Unilat W OR W/O Pelvis 2-3 Views Right; Future   Ambulatory referral to Physical Therapy

## 2024-10-01 DIAGNOSIS — E119 Type 2 diabetes mellitus without complications: Secondary | ICD-10-CM | POA: Diagnosis not present

## 2024-10-01 DIAGNOSIS — M545 Low back pain, unspecified: Secondary | ICD-10-CM | POA: Diagnosis not present

## 2024-10-01 DIAGNOSIS — Z7984 Long term (current) use of oral hypoglycemic drugs: Secondary | ICD-10-CM | POA: Diagnosis not present

## 2024-10-01 DIAGNOSIS — R300 Dysuria: Secondary | ICD-10-CM | POA: Diagnosis not present

## 2024-10-04 ENCOUNTER — Ambulatory Visit: Payer: Self-pay

## 2024-10-04 ENCOUNTER — Telehealth: Payer: Self-pay

## 2024-10-04 NOTE — Telephone Encounter (Signed)
 Copied from CRM [id number]. Topic: Referral - Status >> Oct 04, 2024 11:09 AM Zebedee SAUNDERS wrote: Reason for CRM: Pt's wife ,susan ph: [phone] stated they need referral# [id number] sent to [facility] in [place] [address] [place], [place] [address] ph: [phone], fax: [phone].

## 2024-10-08 ENCOUNTER — Ambulatory Visit: Payer: Self-pay

## 2024-10-08 ENCOUNTER — Other Ambulatory Visit: Payer: Self-pay

## 2024-10-08 NOTE — Telephone Encounter (Signed)
 FYI Only or Action Required?: FYI only for provider.  Patient was last seen in primary care on 09/28/2024 by Sirivol, Mamatha, MD.  Called Nurse Triage reporting Hip Pain.  Symptoms began a week ago.  Interventions attempted: OTC medications: tylenol  and Prescription medications: robaxin .  Symptoms are: gradually improving.  Triage Disposition: See PCP When Office is Open (Within 3 Days)  Patient/caregiver understands and will follow disposition?: Yes  Copied from CRM [id number]. Topic: Clinical - Red Word Triage >> Oct 08, 2024  1:28 PM Wess RAMAN wrote: Red Word that prompted transfer to Nurse Triage: Patient's wife, Devere, stated patient is in pain and needs his methocarbamol (ROBAXIN) 750 MG tablet. Medication is currently pending. Advised it may take up to 3 business days. Pain in his right hip. Physical therapy is not until 10/16/24 Reason for Disposition  [1] MODERATE pain (e.g., interferes with normal activities, limping) AND [2] present > 3 days  Answer Assessment - Initial Assessment Questions Clem[place] over a year ago- with hx arthritis and scoliosis. Oct 2nd- worsened- Saw Dr Ivin 10/3. Unable to get PT appt until 10/21. Waiting for Robaxin refill as patient is still in pain. Script was signed while on call with daughter. Advised medication is ready to be picked up in a bit at the pharmacy.  Overall he is doing a bit better since 10/3 appt. 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 Pain worsened with sitting and standing. Tylenol  takes the edge off and Robaxin is helping with mobility. Pain used to be across entire back and now is down to the hip. ICYHOT and Icing area. Advised if sitting for long periods applying some heat to keep area loosened. EC/UC/Callback instructions given. Overall p[ain has improved just cannot get to PT until 10/21  1. LOCATION and RADIATION: Where is the pain located? Does the pain spread (shoot) anywhere else?     Right hip  2. QUALITY:  What does the pain feel like?  (e.g., sharp, dull, aching, burning)     Sharp versus burning  3. SEVERITY: How bad is the pain? What does it keep you from doing?   (Scale 1-10; or mild, moderate, severe)     Sharp pain,  5-8/10 4. ONSET: When did the pain start? Does it come and go, or is it there all the time?     Intermittent 5. WORK OR EXERCISE: Has there been any recent work or exercise that involved this part of the body?      Denies 6. CAUSE: What do you think is causing the hip pain?      Lumbar arthritis and scoliosis 7. AGGRAVATING FACTORS: What makes the hip pain worse? (e.g., walking, climbing stairs, running)     When getting up and down. Turning while standing, has to move a certain way when getting up  8. OTHER SYMPTOMS: Do you have any other symptoms? (e.g., back pain, pain shooting down leg,  fever, rash)     denies  Protocols used: Hip Pain-A-AH

## 2024-10-12 ENCOUNTER — Telehealth: Payer: Self-pay

## 2024-10-12 NOTE — Telephone Encounter (Signed)
 DONE  Copied from CRM #[id number]. Topic: General - Other >> Oct 12, 2024 10:01 AM Wess RAMAN wrote: Reason for CRM: Patient's wife, Devere, would like for patient's x-rays to be faxed to [facility]. He has an appt at 4pm on 10/22.  Fax #: [phone]

## 2024-10-16 DIAGNOSIS — M545 Low back pain, unspecified: Secondary | ICD-10-CM | POA: Diagnosis not present

## 2024-10-16 DIAGNOSIS — M6281 Muscle weakness (generalized): Secondary | ICD-10-CM | POA: Diagnosis not present

## 2024-10-18 ENCOUNTER — Other Ambulatory Visit: Payer: Self-pay

## 2024-10-18 NOTE — Telephone Encounter (Signed)
 Patients wife calling to confirm request was received from pharmacy. Confirmed request was received. Wife states he needs the prescription refilled today so he may take it tomorrow first thing tomorrow. Advised of turnaround time.   Wife requesting call back stating prescription has been sent in today, wife requesting for call back by 3pm, and if she does not hear back by then she will go to office in person,  [phone]

## 2024-10-22 ENCOUNTER — Ambulatory Visit (INDEPENDENT_AMBULATORY_CARE_PROVIDER_SITE_OTHER): Payer: Medicare HMO

## 2024-10-22 DIAGNOSIS — I472 Ventricular tachycardia, unspecified: Secondary | ICD-10-CM | POA: Diagnosis not present

## 2024-10-22 LAB — CUP PACEART REMOTE DEVICE CHECK
Battery Remaining Longevity: 48 mo
Battery Remaining Percentage: 70 %
Brady Statistic RA Percent Paced: 2 %
Brady Statistic RV Percent Paced: 97 %
Date Time Interrogation Session: 20251027064300
HighPow Impedance: 72 Ohm
Implantable Lead Connection Status: 753985
Implantable Lead Connection Status: 753985
Implantable Lead Connection Status: 753985
Implantable Lead Implant Date: 20181022
Implantable Lead Implant Date: 20181022
Implantable Lead Implant Date: 20181022
Implantable Lead Location: 753858
Implantable Lead Location: 753859
Implantable Lead Location: 753860
Implantable Lead Model: 293
Implantable Lead Model: 4671
Implantable Lead Model: 7741
Implantable Lead Serial Number: 423377
Implantable Lead Serial Number: 809750
Implantable Lead Serial Number: 954460
Implantable Pulse Generator Implant Date: 20181022
Lead Channel Impedance Value: 425 Ohm
Lead Channel Impedance Value: 628 Ohm
Lead Channel Impedance Value: 885 Ohm
Lead Channel Pacing Threshold Amplitude: 0.7 V
Lead Channel Pacing Threshold Amplitude: 0.8 V
Lead Channel Pacing Threshold Amplitude: 1.4 V
Lead Channel Pacing Threshold Pulse Width: 0.4 ms
Lead Channel Pacing Threshold Pulse Width: 0.4 ms
Lead Channel Pacing Threshold Pulse Width: 0.4 ms
Lead Channel Setting Pacing Amplitude: 2 V
Lead Channel Setting Pacing Amplitude: 2 V
Lead Channel Setting Pacing Amplitude: 2.6 V
Lead Channel Setting Pacing Pulse Width: 0.4 ms
Lead Channel Setting Pacing Pulse Width: 0.4 ms
Lead Channel Setting Sensing Sensitivity: 0.6 mV
Lead Channel Setting Sensing Sensitivity: 1 mV
Pulse Gen Serial Number: 181086

## 2024-10-24 ENCOUNTER — Telehealth: Payer: Self-pay | Admitting: Family Medicine

## 2024-10-24 ENCOUNTER — Encounter: Payer: Self-pay | Admitting: Emergency Medicine

## 2024-10-24 ENCOUNTER — Ambulatory Visit: Payer: Self-pay | Admitting: Cardiology

## 2024-10-24 DIAGNOSIS — M6281 Muscle weakness (generalized): Secondary | ICD-10-CM | POA: Diagnosis not present

## 2024-10-24 DIAGNOSIS — M545 Low back pain, unspecified: Secondary | ICD-10-CM | POA: Diagnosis not present

## 2024-10-24 NOTE — Telephone Encounter (Signed)
 [facility] [place] - INITIAL EVAL POC 10/16/24

## 2024-10-25 ENCOUNTER — Other Ambulatory Visit: Payer: Self-pay | Admitting: Family Medicine

## 2024-10-25 NOTE — Progress Notes (Signed)
 Remote ICD Transmission

## 2024-10-27 ENCOUNTER — Other Ambulatory Visit: Payer: Self-pay

## 2024-10-30 ENCOUNTER — Ambulatory Visit
Admission: RE | Admit: 2024-10-30 | Discharge: 2024-10-30 | Disposition: A | Discharge status: Home / Self Care | Source: Ambulatory Visit | Attending: Emergency Medicine | Admitting: Emergency Medicine

## 2024-10-30 ENCOUNTER — Other Ambulatory Visit: Payer: Self-pay | Admitting: Family Medicine

## 2024-10-30 DIAGNOSIS — R9389 Abnormal findings on diagnostic imaging of other specified body structures: Secondary | ICD-10-CM

## 2024-10-30 DIAGNOSIS — J479 Bronchiectasis, uncomplicated: Secondary | ICD-10-CM | POA: Diagnosis not present

## 2024-10-31 ENCOUNTER — Telehealth: Payer: Self-pay | Admitting: Cardiology

## 2024-10-31 NOTE — Telephone Encounter (Signed)
*  STAT* If patient is at the pharmacy, call can be transferred to refill team.   1. Which medications need to be refilled? (please list name of each medication and dose if known) furosemide  (LASIX ) 20 MG tablet   2. Which pharmacy/location (including street and city if local pharmacy) is medication to be sent to? [facility] [phone] - [place], [place] - [address]   3. Do they need a 30 day or 90 day supply? 90

## 2024-11-01 MED ORDER — FUROSEMIDE 20 MG PO TABS: 20.0000 mg | ORAL_TABLET | Freq: Every day | ORAL | 0 refills | Status: DC

## 2024-11-01 NOTE — Telephone Encounter (Signed)
 Refill or Lasix  has been sent.

## 2024-11-05 ENCOUNTER — Other Ambulatory Visit: Payer: Self-pay | Admitting: Cardiology

## 2024-11-06 ENCOUNTER — Encounter: Payer: Self-pay | Admitting: *Deleted

## 2024-11-06 NOTE — Assessment & Plan Note (Signed)
 LDL optimal in May 2025.***

## 2024-11-06 NOTE — Assessment & Plan Note (Signed)
 History of previous myocardial infarction, CABG in 2003 and cardiac catheterization in 2023 with patent LIMA-LAD, occluded vein graft to the RPDA.  Native LAD with mid occlusion and severe distal vessel disease that could not be approached by PCI and managed medically.***

## 2024-11-06 NOTE — Assessment & Plan Note (Signed)
 He was managed with mexiletine and amiodarone .  Status post VT ablation in August 2025.  He is now off amiodarone  and mexiletine. TSH was elevated prior to stopping amiodarone .  - Pt should reach out if TSH not checked by the first of the year.  - Continue follow up with EP as planned.

## 2024-11-06 NOTE — Assessment & Plan Note (Signed)
 EF 40-45 by echocardiogram November 2023.***

## 2024-11-06 NOTE — Progress Notes (Unsigned)
 OFFICE NOTE:    Date:  11/07/2024  ID:  SEVERN GODDARD, DOB 1951/11/29, MRN [id number] PCP: Sherre Clapper, MD  [facility] Providers Cardiologist:  Lurena MARLA Red, MD Cardiology APP:  Lelon Bryan DASEN, PA-C  Electrophysiologist:  Will Gladis Norton, MD  Electrophysiology APP:  Leverne Charlies Macario DEVONNA   [facility] EP: Belvie Heckler, MD       Coronary artery disease  Hx of myocardial infarction  S/p CABG 2003 LHC 01/07/22: LAD mid 100, 95, dist 70, D2 100; OM1 60; RCA prox 100, mid 100; L-LAD patent, S-RPDA 100 >> mid and dist LAD segments may be causing ischemia induce arrhythmia but not approachable by PCI>>Med Rx (HFmrEF (heart failure with mildly reduced ejection fraction)  Ischemic CM  TTE 11/01/22: EF 40-45, severe LVE, Gr 1 DD, Inf HK, mildly reduced RVSF, mild LAE, trivial MR, aortic root 41 mm Ventricular tachycardia S/p CRT-D  S/p VT ablation at [facility] 07/2024 Dr. Heckler Left Bundle Branch Block  Thoracic aortic ectasia  Pre-diabetes  Chronic kidney disease  Hyperlipidemia  Lp(a) 142.2 Hypothyroidism         Discussed the use of AI scribe software for clinical note transcription with the patient, who gave verbal consent to proceed. History of Present Illness Bryan Miller is a 73 y.o. male for follow up of CAD, CHF. Last seen by Artist Pouch, PA-C in 11/2023.    He is here with his wife. Occasional leg swelling is noted, which worsened when his furosemide  ran out, but improved with resumption of the medication. VT ablation was done in August 2022. He was taken off amiodarone  due to elevated TSH. He is scheduled for follow-up with Dr. Heckler in [place]. Recent CT was done by Dr. Shelah for follow up on pulmonary nodules. This did show dilated pulmonary artery suggestive of pulmonary hypertension. Coronary Ca2+ was also noted. He has not had chest discomfort, pain, pressure, or tightness, and he is able to climb stairs without shortness of breath. He experiences  musculoskeletal pain, particularly in the back and R hip, which is being managed with physical therapy. He is active, participating in pool and walking exercises. He has concerns about statin therapy but reports no side effects.     ROS-See HPI    Studies Reviewed:  EKG Interpretation Date/Time:  Wednesday November 07 2024 09:46:09 EST Ventricular Rate:  70 PR Interval:    QRS Duration:  170 QT Interval:  488 QTC Calculation: 527 R Axis:   210  Text Interpretation: Atrial Sensed-Ventricular Paced Confirmed by Lelon Bryan [phone]) on 11/07/2024 9:50:51 AM    Labs 05/08/2024: K 4.8, creatinine 1.51, eGFR 48, total cholesterol 110, HDL 51, triglycerides 173, LDL 31, Hgb 15.1, PLT 213K 09/17/2024: ALT 15, TSH 7.160        Physical Exam:  VS:  BP (!) 98/52   Pulse 70   Ht 6' 3 (1.905 m)   Wt 256 lb (116.1 kg)   SpO2 99%   BMI 32.00 kg/m        Wt Readings from Last 3 Encounters:  11/07/24 256 lb (116.1 kg)  09/28/24 257 lb (116.6 kg)  09/17/24 253 lb (114.8 kg)    Constitutional:      Appearance: Healthy appearance. Not in distress.  Neck:     Vascular: JVD normal.  Pulmonary:     Breath sounds: Normal breath sounds. No wheezing. No rales.  Cardiovascular:     Normal rate. Regular rhythm.  Murmurs: There is no murmur.  Edema:    Peripheral edema present.    Pretibial: bilateral trace edema of the pretibial area.    Ankle: bilateral 1+ edema of the ankle. Abdominal:     Palpations: Abdomen is soft.       Assessment and Plan:    Assessment & Plan Coronary artery disease involving native coronary artery of native heart without angina pectoris History of previous myocardial infarction, CABG in 2003 and cardiac catheterization in 2023 with patent LIMA-LAD, occluded vein graft to the RPDA.  Native LAD with mid occlusion and severe distal vessel disease that could not be approached by PCI and managed medically. He is doing well w/o chest pain to suggest angina. -  Continue aspirin  81 mg daily - Continue Lipitor 40 mg daily - Continue Repatha  140 mg every two weeks - Continue Toprol  XL 25 mg daily - Continue Ranolazine  500 mg twice daily - Follow up June 2026 Heart failure with mildly reduced ejection fraction (HFmrEF, 41-49%) (HCC) EF 40-45 by echocardiogram November 2023. Ischemic cardiomyopathy. Volume status stable. NYHA class II-IIb.  He could not tolerate ACE/ARB due to hypotension. - Continue Lasix  20 mg daily - Continue metoprolol  succinate 25 mg daily - Continue Jardiance  10 mg daily - Continue spironolactone  12.5 mg daily Mixed hyperlipidemia LDL optimal in May 2025. Lipoprotein(a) level elevated at 142.2. Triglycerides slightly elevated at 173.  Patient notes concerns about statin therapy but no side effects reported.  We discussed benefits of continuing statin therapy in addition to PCSK9 inhibitor. - Continue Lipitor 40 mg daily - Continue Repatha  140 mg every two weeks - Monitor diet to reduce triglycerides Ventricular tachycardia (HCC) Biventricular automatic implantable cardioverter defibrillator in situ He was managed with mexiletine and amiodarone .  Status post VT ablation in August 2025.  He is now off amiodarone  and mexiletine. TSH was elevated prior to stopping amiodarone .  - Pt should reach out if TSH not checked by the first of the year.  - Continue follow up with EP as planned. Chronic kidney disease, stage 3a (HCC) Recent GFR of 48. Pulmonary hypertension, unspecified (HCC) Recent CT scan showed enlarged pulmonary artery, suggestive of pulmonary hypertension. Last echocardiogram was a couple of years ago. No current symptoms of shortness of breath or difficulty with exertion. He does note a hx of snoring and daytime sleepiness. - Order echocardiogram to assess PASP - If PASP elevated or spouse notes apneic episodes, would pursue sleep study        Dispo:  Return in about 7 months (around 06/07/2025) for Routine Follow Up,  w/ Dr. Wendel.  Signed, Bryan Ferrier, PA-C

## 2024-11-07 ENCOUNTER — Encounter: Payer: Self-pay | Admitting: Physician Assistant

## 2024-11-07 ENCOUNTER — Ambulatory Visit: Admitting: Physician Assistant

## 2024-11-07 ENCOUNTER — Ambulatory Visit: Attending: Physician Assistant | Admitting: Physician Assistant

## 2024-11-07 VITALS — BP 98/52 | HR 70 | Ht 75.0 in | Wt 256.0 lb

## 2024-11-07 DIAGNOSIS — I272 Pulmonary hypertension, unspecified: Secondary | ICD-10-CM

## 2024-11-07 DIAGNOSIS — I472 Ventricular tachycardia, unspecified: Secondary | ICD-10-CM | POA: Diagnosis not present

## 2024-11-07 DIAGNOSIS — E782 Mixed hyperlipidemia: Secondary | ICD-10-CM | POA: Diagnosis not present

## 2024-11-07 DIAGNOSIS — N1831 Chronic kidney disease, stage 3a: Secondary | ICD-10-CM

## 2024-11-07 DIAGNOSIS — I2510 Atherosclerotic heart disease of native coronary artery without angina pectoris: Secondary | ICD-10-CM | POA: Diagnosis not present

## 2024-11-07 DIAGNOSIS — Z95810 Presence of automatic (implantable) cardiac defibrillator: Secondary | ICD-10-CM | POA: Diagnosis not present

## 2024-11-07 DIAGNOSIS — I5020 Unspecified systolic (congestive) heart failure: Secondary | ICD-10-CM

## 2024-11-07 MED ORDER — FUROSEMIDE 20 MG PO TABS: 20.0000 mg | ORAL_TABLET | Freq: Every day | ORAL | 3 refills | Status: AC

## 2024-11-07 NOTE — Patient Instructions (Addendum)
 Medication Instructions:  START: Furosemide  20 mg (1 tablet) daily *If you need a refill on your cardiac medications before your next appointment, please call your pharmacy*  Lab Work: NONE If you have labs (blood work) drawn today and your tests are completely normal, you will receive your results only by: MyChart Message (if you have MyChart) OR A paper copy in the mail If you have any lab test that is abnormal or we need to change your treatment, we will call you to review the results.  Testing/Procedures: Your physician has requested that you have an echocardiogram. Echocardiography is a painless test that uses sound waves to create images of your heart. It provides your doctor with information about the size and shape of your heart and how well your heart's chambers and valves are working. This procedure takes approximately one hour. There are no restrictions for this procedure. Please do NOT wear cologne, perfume, aftershave, or lotions (deodorant is allowed). Please arrive 15 minutes prior to your appointment time.  Please note: We ask at that you not bring children with you during ultrasound (echo/ vascular) testing. Due to room size and safety concerns, children are not allowed in the ultrasound rooms during exams. Our front office staff cannot provide observation of children in our lobby area while testing is being conducted. An adult accompanying a patient to their appointment will only be allowed in the ultrasound room at the discretion of the ultrasound technician under special circumstances. We apologize for any inconvenience.   Follow-Up: At [facility], you and your health needs are our priority.  As part of our continuing mission to provide you with exceptional heart care, our providers are all part of one team.  This team includes your primary Cardiologist (physician) and Advanced Practice Providers or APPs (Physician Assistants and Nurse Practitioners) who all work  together to provide you with the care you need, when you need it.  Your next appointment:    June 2026  Provider:   Arun K Thukkani, MD

## 2024-11-07 NOTE — Assessment & Plan Note (Signed)
 Recent GFR of 48.

## 2024-11-09 ENCOUNTER — Telehealth: Payer: Self-pay

## 2024-11-09 DIAGNOSIS — I472 Ventricular tachycardia, unspecified: Secondary | ICD-10-CM

## 2024-11-09 NOTE — Telephone Encounter (Signed)
 Alert CRT D remote transmission: Antitachycardia pacing (ATP) therapy delivered to convert arrhythmia that slowed the VT but did not terminate.   Presenting is AS/BVP 72bpm.   1 VT event on 11/07/24. ~37beats of V>A treated w/ ATP x 1 3 NSVT events 11/4-11/6. V-rates 150s. Longest x 21 sec.   Patient says he has been more fatigued but no other symptoms.  VT ablation by Dr. Sharron in August, he continues to follow with him and sees him again in December.  Patient is following him for EP care moving forward.   Patient taken off of amio and mexiletine and programming changes to VT zones at time of ablation.  Still taking metoprolol .   Several beats falling below VT detection and tx zone that is currently programmed at 150bpm. Sustained VT events falling in 140's per EGMs below of unknown duration, ongoing at end of strip.   According to HG's overall burden appears low.    LM for [facility] device clinic/nurse Norleen Passy who works for Dr. Sharron and EP team at [facility].  Will make a follow up plan for patient once we hear back from him.   Patient aware that if he is having extended periods of fast heart rates and/or becomes symptomatic at any point, he should go to the ER.   [place] DMV driving restrictions given X 6 months.

## 2024-11-12 ENCOUNTER — Other Ambulatory Visit: Payer: Self-pay

## 2024-11-13 NOTE — Telephone Encounter (Signed)
 No openings with EP apps here until 12/04/24.  LM at [facility] for Dr.Hrantizky to discuss patient transfer and events and make a follow up plan.

## 2024-11-13 NOTE — Telephone Encounter (Signed)
 Spoke with Morna Gentle, NP for Dr. Sharron.  She recommends we have patient go back on the amio.  Has plans to follow up with them in December.   Made patient aware of need for labs and office appt this week with our EP APP Jodie Passey PA-C on 11/20 at 1020am.  Patient going today to [facility] for MAG AND BMP.

## 2024-11-13 NOTE — Telephone Encounter (Signed)
 Pt is calling checking the status of this message. Please advise.

## 2024-11-14 LAB — MAGNESIUM: Magnesium: 2.3 mg/dL (ref 1.6–2.3)

## 2024-11-15 ENCOUNTER — Ambulatory Visit: Attending: Student | Admitting: Student

## 2024-11-15 ENCOUNTER — Encounter: Payer: Self-pay | Admitting: Student

## 2024-11-15 VITALS — BP 121/72 | HR 74 | Ht 75.0 in | Wt 255.6 lb

## 2024-11-15 DIAGNOSIS — I2510 Atherosclerotic heart disease of native coronary artery without angina pectoris: Secondary | ICD-10-CM

## 2024-11-15 DIAGNOSIS — I472 Ventricular tachycardia, unspecified: Secondary | ICD-10-CM

## 2024-11-15 DIAGNOSIS — I255 Ischemic cardiomyopathy: Secondary | ICD-10-CM | POA: Diagnosis not present

## 2024-11-15 DIAGNOSIS — Z95810 Presence of automatic (implantable) cardiac defibrillator: Secondary | ICD-10-CM | POA: Diagnosis not present

## 2024-11-15 MED ORDER — AMIODARONE HCL 200 MG PO TABS: ORAL_TABLET | ORAL | 0 refills | Status: AC

## 2024-11-15 NOTE — Progress Notes (Signed)
  Electrophysiology Office Note:   ID:  Bryan, Miller [birth year], MRN [id number]  Primary Cardiologist: Arun K Thukkani, MD Electrophysiologist: Soyla Gladis Norton, MD      History of Present Illness:   Bryan Miller is a 73 y.o. male with h/o ICM s/p CRT-D, VT s/p ablation, CAD s/p CABG, CKD III, hypothyroidism, HTN, and HLD seen today for acute visit due to VT below detection.    He saw Dr. Fernande 4/4/5, amio increased, discussed perhaps VT ablation   VT ablation with Dr. Sharron on 08/17/24.  Saw Renee 09/17/2024 doing well, had planned to transfer to Dr. Jethro care.   Device alert received on 11/09/24 Presenting is AS/BVP 72bpm.  1 VT event on 11/07/24. ~37beats of V>A treated w/ ATP x 1 3 NSVT events 11/4-11/6. V-rates 150s. Longest x 21 sec.   Discussed with Dr. Jethro team and placed back on amiodarone  with plan to visit us  for reprogramming   Patient reports OK overall. Frustrated to have had more ATP. They had also requested full transfer to Dr. Sharron, and there was some confusion on being called for us  to manage his device. No chest pain or SOB currently. No syncope.   Review of systems complete and found to be negative unless listed in HPI.   EP Information / Studies Reviewed:    EKG is not ordered today. EKG from 11/07/2024 reviewed which showed AS-VP rhythm at 70 bpm       ICD Interrogation-  reviewed in detail today,  See PACEART report.  Arrhythmia/Device History BSX ICD SK (GORE lead)   Physical Exam:   VS:  BP 121/72   Pulse 74   Ht 6' 3 (1.905 m)   Wt 255 lb 9.6 oz (115.9 kg)   SpO2 98%   BMI 31.95 kg/m    Wt Readings from Last 3 Encounters:  11/15/24 255 lb 9.6 oz (115.9 kg)  11/07/24 256 lb (116.1 kg)  09/28/24 257 lb (116.6 kg)     GEN: No acute distress  NECK: No JVD; No carotid bruits CARDIAC: Regular rate and rhythm, no murmurs, rubs, gallops RESPIRATORY:  Clear to auscultation without rales, wheezing or rhonchi   ABDOMEN: Soft, non-tender, non-distended EXTREMITIES:  No edema; No deformity   ASSESSMENT AND PLAN:    Chronic systolic CHF  s/p [employer] CRT-D  euvolemic today Stable on an appropriate medical regimen Normal ICD function See Elisabeth Art report  Report did not save. Not charged for device testing. Iterative testing not performed but VT zone adjusted.  Detection lowered to 145 Increased bursts from 5 to 6. Decreased CL from 88 -> 84%.   VT S/p ablation with Dr. Sharron 07/2024 Recurrent VT hovering around detection Resume amiodarone  200 mg daily x 2 weeks, then 100 mg daily.  CAD No s/s of ischemia.      Disposition:   Follow up with me in 3-4 months as needed. Pt and wife re-iterated plan to consolidate EP care under Dr. Sharron.    Signed, Ozell Prentice Passey, PA-C

## 2024-11-15 NOTE — Patient Instructions (Signed)
 Medication Instructions:  Start amiodarone  200 mg daily for one week, then decrease to 100 mg (1/2 tablet) daily *If you need a refill on your cardiac medications before your next appointment, please call your pharmacy*  Lab Work: None ordered If you have labs (blood work) drawn today and your tests are completely normal, you will receive your results only by: MyChart Message (if you have MyChart) OR A paper copy in the mail If you have any lab test that is abnormal or we need to change your treatment, we will call you to review the results.  Follow-Up: At [facility], you and your health needs are our priority.  As part of our continuing mission to provide you with exceptional heart care, our providers are all part of one team.  This team includes your primary Cardiologist (physician) and Advanced Practice Providers or APPs (Physician Assistants and Nurse Practitioners) who all work together to provide you with the care you need, when you need it.  Your next appointment:   3-4 month(s)  Provider:   Ozell Jodie Passey, PA-C

## 2024-11-17 ENCOUNTER — Other Ambulatory Visit: Payer: Self-pay | Admitting: Family Medicine

## 2024-11-17 DIAGNOSIS — I25119 Atherosclerotic heart disease of native coronary artery with unspecified angina pectoris: Secondary | ICD-10-CM

## 2024-11-19 NOTE — Telephone Encounter (Signed)
 Patient seen in office last week by A. Tillery PA-C.  Wife/patient frustrated because they do not want to have to continue to follow us  and Hranitzky.  They have reported since August that they want to se Hranitzky only moving forward.  I have had failed attempts with [place] device clinic to discuss and make the office transfer.   Sent message to [place], Hrantizky's PA to offer any guidance to assist us  with the office transfer.

## 2024-11-27 ENCOUNTER — Other Ambulatory Visit: Payer: Self-pay

## 2024-11-28 LAB — BASIC METABOLIC PANEL WITH GFR

## 2024-11-30 DIAGNOSIS — H5203 Hypermetropia, bilateral: Secondary | ICD-10-CM | POA: Diagnosis not present

## 2024-11-30 NOTE — Telephone Encounter (Signed)
 Confirmed with Clint - Dr. Jethro NP who sees patient now at [facility]. She states that they have officially transferred him in to their clinic and will take over all monitoring and follow ups moving forward.  He has appointment with them in the next 2 weeks.   I will cancel f/u plans with A. Tillery PA-C and release patient through website, mark inactive in [place] and cancel all remotes.

## 2024-12-05 ENCOUNTER — Other Ambulatory Visit: Payer: Self-pay | Admitting: Cardiology

## 2024-12-07 MED ORDER — EMPAGLIFLOZIN 10 MG PO TABS: 10.0000 mg | ORAL_TABLET | Freq: Every day | ORAL | 3 refills | Status: AC

## 2024-12-10 ENCOUNTER — Encounter (HOSPITAL_BASED_OUTPATIENT_CLINIC_OR_DEPARTMENT_OTHER): Payer: Self-pay

## 2024-12-10 ENCOUNTER — Encounter: Payer: Self-pay | Admitting: Internal Medicine

## 2024-12-13 ENCOUNTER — Other Ambulatory Visit: Payer: Self-pay | Admitting: Family Medicine

## 2024-12-17 ENCOUNTER — Ambulatory Visit (HOSPITAL_COMMUNITY)
Admission: RE | Admit: 2024-12-17 | Discharge: 2024-12-17 | Disposition: A | Discharge status: Home / Self Care | Source: Ambulatory Visit | Attending: Physician Assistant | Admitting: Physician Assistant

## 2024-12-17 DIAGNOSIS — I5020 Unspecified systolic (congestive) heart failure: Secondary | ICD-10-CM | POA: Diagnosis present

## 2024-12-17 DIAGNOSIS — I272 Pulmonary hypertension, unspecified: Secondary | ICD-10-CM | POA: Insufficient documentation

## 2024-12-17 DIAGNOSIS — I2510 Atherosclerotic heart disease of native coronary artery without angina pectoris: Secondary | ICD-10-CM | POA: Insufficient documentation

## 2024-12-17 LAB — ECHOCARDIOGRAM COMPLETE
Area-P 1/2: 3.24 cm2
S' Lateral: 5.6 cm

## 2024-12-17 MED ORDER — PERFLUTREN LIPID MICROSPHERE
1.0000 mL | INTRAVENOUS | Status: AC | PRN
  Administered 2024-12-17: 2 mL via INTRAVENOUS

## 2024-12-18 ENCOUNTER — Ambulatory Visit: Payer: Self-pay | Admitting: Physician Assistant

## 2024-12-18 DIAGNOSIS — I071 Rheumatic tricuspid insufficiency: Secondary | ICD-10-CM | POA: Insufficient documentation

## 2024-12-18 DIAGNOSIS — I340 Nonrheumatic mitral (valve) insufficiency: Secondary | ICD-10-CM | POA: Insufficient documentation

## 2024-12-18 DIAGNOSIS — I5020 Unspecified systolic (congestive) heart failure: Secondary | ICD-10-CM

## 2024-12-18 DIAGNOSIS — I77810 Thoracic aortic ectasia: Secondary | ICD-10-CM | POA: Insufficient documentation

## 2025-01-11 ENCOUNTER — Ambulatory Visit: Payer: Self-pay | Admitting: Emergency Medicine

## 2025-01-17 ENCOUNTER — Other Ambulatory Visit: Payer: Self-pay | Admitting: Family Medicine

## 2025-01-24 ENCOUNTER — Encounter: Payer: Self-pay | Admitting: Physician Assistant

## 2025-01-24 ENCOUNTER — Ambulatory Visit: Admitting: Physician Assistant

## 2025-01-24 VITALS — BP 110/62 | HR 85 | Temp 98.1°F | Ht 75.0 in | Wt 247.0 lb

## 2025-01-24 DIAGNOSIS — I340 Nonrheumatic mitral (valve) insufficiency: Secondary | ICD-10-CM | POA: Diagnosis not present

## 2025-01-24 DIAGNOSIS — J0100 Acute maxillary sinusitis, unspecified: Secondary | ICD-10-CM | POA: Diagnosis not present

## 2025-01-24 MED ORDER — DOXYCYCLINE HYCLATE 100 MG PO TABS: 100.0000 mg | ORAL_TABLET | Freq: Two times a day (BID) | ORAL | 0 refills | Status: AC

## 2025-01-24 MED ORDER — SPIRONOLACTONE 25 MG PO TABS: 12.5000 mg | ORAL_TABLET | Freq: Every day | ORAL | 1 refills | Status: AC

## 2025-01-24 MED ORDER — BENZONATATE 200 MG PO CAPS: 200.0000 mg | ORAL_CAPSULE | Freq: Three times a day (TID) | ORAL | 0 refills | Status: AC | PRN

## 2025-01-24 NOTE — Assessment & Plan Note (Addendum)
 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 Recent episode possibly related to solar flares. Medication adjustments made, including reduction of amiodarone  and potential reduction of thyroid  medication. - Continue follow-up with cardiologist on March 12th. Orders:   spironolactone  (ALDACTONE ) 25 MG tablet; Take 0.5 tablets (12.5 mg total) by mouth daily.

## 2025-01-28 ENCOUNTER — Telehealth: Payer: Self-pay | Admitting: Family Medicine

## 2025-01-28 ENCOUNTER — Ambulatory Visit: Payer: Self-pay

## 2025-01-28 ENCOUNTER — Other Ambulatory Visit: Payer: Self-pay | Admitting: Family Medicine

## 2025-01-28 MED ORDER — AMOXICILLIN-POT CLAVULANATE 875-125 MG PO TABS: 1.0000 | ORAL_TABLET | Freq: Two times a day (BID) | ORAL | 0 refills | Status: AC

## 2025-01-28 NOTE — Telephone Encounter (Signed)
 Pt is requesting a stronger antibiotic - he feels that infection is going into his lungs.

## 2025-02-15 ENCOUNTER — Ambulatory Visit: Admitting: Student

## 2025-04-22 ENCOUNTER — Encounter

## 2025-07-22 ENCOUNTER — Encounter

## 2025-10-21 ENCOUNTER — Encounter

## 2026-01-20 ENCOUNTER — Encounter
